# Patient Record
Sex: Female | Born: 1985 | Race: Black or African American | Hispanic: No | Marital: Single | Smoking: Never smoker
Health system: Southern US, Community
[De-identification: ages and names within clinical notes are randomized; demographics above are authoritative.]

## PROBLEM LIST (undated history)

## (undated) ENCOUNTER — Inpatient Hospital Stay (HOSPITAL_COMMUNITY): Payer: Self-pay

## (undated) DIAGNOSIS — D573 Sickle-cell trait: Secondary | ICD-10-CM

## (undated) DIAGNOSIS — R51 Headache: Secondary | ICD-10-CM

## (undated) DIAGNOSIS — D649 Anemia, unspecified: Secondary | ICD-10-CM

## (undated) DIAGNOSIS — O24419 Gestational diabetes mellitus in pregnancy, unspecified control: Secondary | ICD-10-CM

## (undated) DIAGNOSIS — D563 Thalassemia minor: Secondary | ICD-10-CM

## (undated) DIAGNOSIS — Z789 Other specified health status: Secondary | ICD-10-CM

## (undated) HISTORY — DX: Sickle-cell trait: D57.3

---

## 2011-12-13 ENCOUNTER — Encounter (HOSPITAL_COMMUNITY): Payer: Self-pay | Admitting: *Deleted

## 2011-12-13 ENCOUNTER — Inpatient Hospital Stay (HOSPITAL_COMMUNITY)
Admission: AD | Admit: 2011-12-13 | Discharge: 2011-12-13 | Disposition: A | Discharge status: Home / Self Care | Payer: Medicaid Other | Source: Ambulatory Visit | Attending: Obstetrics & Gynecology | Admitting: Obstetrics & Gynecology

## 2011-12-13 DIAGNOSIS — N760 Acute vaginitis: Secondary | ICD-10-CM | POA: Insufficient documentation

## 2011-12-13 DIAGNOSIS — A499 Bacterial infection, unspecified: Secondary | ICD-10-CM | POA: Insufficient documentation

## 2011-12-13 DIAGNOSIS — B9689 Other specified bacterial agents as the cause of diseases classified elsewhere: Secondary | ICD-10-CM | POA: Insufficient documentation

## 2011-12-13 DIAGNOSIS — O2390 Unspecified genitourinary tract infection in pregnancy, unspecified trimester: Secondary | ICD-10-CM | POA: Insufficient documentation

## 2011-12-13 DIAGNOSIS — N949 Unspecified condition associated with female genital organs and menstrual cycle: Secondary | ICD-10-CM

## 2011-12-13 DIAGNOSIS — R109 Unspecified abdominal pain: Secondary | ICD-10-CM | POA: Insufficient documentation

## 2011-12-13 DIAGNOSIS — O26899 Other specified pregnancy related conditions, unspecified trimester: Secondary | ICD-10-CM

## 2011-12-13 HISTORY — DX: Anemia, unspecified: D64.9

## 2011-12-13 LAB — URINALYSIS, ROUTINE W REFLEX MICROSCOPIC
Bilirubin Urine: NEGATIVE
Hgb urine dipstick: NEGATIVE
Protein, ur: NEGATIVE mg/dL
Urobilinogen, UA: 0.2 mg/dL (ref 0.0–1.0)

## 2011-12-13 MED ORDER — METRONIDAZOLE 500 MG PO TABS: 500.0000 mg | ORAL_TABLET | Freq: Two times a day (BID) | ORAL | Status: AC

## 2011-12-13 MED ORDER — METRONIDAZOLE 500 MG PO TABS: 500.0000 mg | ORAL_TABLET | Freq: Two times a day (BID) | ORAL | Status: DC

## 2011-12-13 MED ORDER — ACETAMINOPHEN 325 MG PO TABS
650.0000 mg | ORAL_TABLET | Freq: Once | ORAL | Status: AC
  Administered 2011-12-13: 650 mg via ORAL
  Filled 2011-12-13: qty 2

## 2011-12-13 NOTE — MAU Provider Note (Signed)
History     CSN: [id number]  Arrival date and time: 12/13/11 1648   First Provider Initiated Contact with Patient 12/13/11 1742      Chief Complaint  Patient presents with  . Abdominal Pain   HPI Pt is ?[redacted] weeks pregnant and presents with bilateral lower uterine cramping/ pain radiating down her legs.Pt is from [place]. Angela [name] and has been here for 3 months.  She denies complications with her previous pregnancies.  She has had 2 term deliveries and 3 TABs.  Pt denies vaginal discharge, UTI symptoms or constipation/diarrhea.  The pain is diffuse and is worse when she moves.  She has not taken anything for pain.    Past Medical History  Diagnosis Date  . Anemia     Past Surgical History  Procedure Date  . Cesarean section     Family History  Problem Relation Age of Onset  . Other Neg Hx     History  Substance Use Topics  . Smoking status: Never Smoker   . Smokeless tobacco: Never Used  . Alcohol Use: No    Allergies: No Known Allergies  Prescriptions prior to admission  Medication Sig Dispense Refill  . Prenatal Vit-Fe Fumarate-FA (PRENATAL MULTIVITAMIN) TABS Take 1 tablet by mouth daily.        Review of Systems  Constitutional: Negative for fever and chills.  Gastrointestinal: Positive for nausea and abdominal pain. Negative for vomiting, diarrhea and constipation.       Pain is bilateral lower abdominal-radiating down her legs  Genitourinary: Negative for dysuria, urgency, frequency, hematuria and flank pain.   Physical Exam   Blood pressure 104/63, pulse 78, temperature 98.8 F (37.1 C), temperature source Oral, resp. rate 16, height 5' 2.25" (1.581 m), weight 178 lb 9.6 oz (81.012 kg), last menstrual period 09/27/2011, SpO2 100.00%.  Physical Exam  Vitals reviewed. Constitutional: She is oriented to person, place, and time. She appears well-developed and well-nourished.  HENT:  Head: Normocephalic.  Eyes: Pupils are equal, round, and reactive to light.    Neck: Normal range of motion. Neck supple.  Cardiovascular: Normal rate.   Respiratory: Effort normal.  GI: Soft. Bowel sounds are normal. She exhibits no distension. There is tenderness. There is no rebound and no guarding.       Mild diffuse tenderness; no rebound; FHR 150bpm with doppler  Genitourinary:       Mod amount of frothy white discharge in vault; cervix closed, long, clean NT; uterus enlarged?16-18 week size; adnexal without palpable enlargement or tenderness.  Musculoskeletal: Normal range of motion.  Neurological: She is alert and oriented to person, place, and time.  Skin: Skin is warm and dry.    MAU Course  Procedures Results for orders placed during the hospital encounter of 12/13/11 (from the past 24 hour(s))  URINALYSIS, ROUTINE W REFLEX MICROSCOPIC     Status: Normal   Collection Time   12/13/11  5:15 PM      Component Value Range   Color, Urine YELLOW  YELLOW   APPearance CLEAR  CLEAR   Specific Gravity, Urine 1.010  1.005 - 1.030   pH 7.0  5.0 - 8.0   Glucose, UA NEGATIVE  NEGATIVE mg/dL   Hgb urine dipstick NEGATIVE  NEGATIVE   Bilirubin Urine NEGATIVE  NEGATIVE   Ketones, ur NEGATIVE  NEGATIVE mg/dL   Protein, ur NEGATIVE  NEGATIVE mg/dL   Urobilinogen, UA 0.2  0.0 - 1.0 mg/dL   Nitrite NEGATIVE  NEGATIVE   Leukocytes,  UA NEGATIVE  NEGATIVE  WET PREP, GENITAL     Status: Abnormal   Collection Time   12/13/11  6:10 PM      Component Value Range   Yeast Wet Prep HPF POC NONE SEEN  NONE SEEN   Trich, Wet Prep NONE SEEN  NONE SEEN   Clue Cells Wet Prep HPF POC MODERATE (*) NONE SEEN   WBC, Wet Prep HPF POC FEW (*) NONE SEEN   Tylenol given for pain Assessment and Plan  Abdominal pain in pregnancy- 2nd trimester Round ligament pain- Tylenol prn BV- prescription for Flagyl 500 mg BID for 7 days Pursue prenatal care as soon as Medicaid approved  Tyquasia Pant 12/13/2011, 5:51 PM

## 2011-12-13 NOTE — MAU Note (Signed)
Patient states she has had no prenatal care. States she had an ultrasound the first couple of weeks in June in the [place] and was told she was nine weeks. Stats she started having lower abdominal and back pain yesterday afternoon, urinary frequency, no bleeding or vaginal discharge.

## 2011-12-14 LAB — GC/CHLAMYDIA PROBE AMP, GENITAL
Chlamydia, DNA Probe: NEGATIVE
GC Probe Amp, Genital: NEGATIVE

## 2012-01-09 LAB — OB RESULTS CONSOLE HIV ANTIBODY (ROUTINE TESTING): HIV: NONREACTIVE

## 2012-01-09 LAB — OB RESULTS CONSOLE RUBELLA ANTIBODY, IGM: Rubella: IMMUNE

## 2012-01-09 LAB — OB RESULTS CONSOLE RPR: RPR: NONREACTIVE

## 2012-05-14 ENCOUNTER — Inpatient Hospital Stay (HOSPITAL_COMMUNITY)
Admission: AD | Admit: 2012-05-14 | Discharge: 2012-05-15 | Disposition: A | Discharge status: Home / Self Care | Payer: Medicaid Other | Source: Ambulatory Visit | Attending: Obstetrics | Admitting: Obstetrics

## 2012-05-14 ENCOUNTER — Encounter (HOSPITAL_COMMUNITY): Payer: Self-pay | Admitting: *Deleted

## 2012-05-14 ENCOUNTER — Inpatient Hospital Stay (HOSPITAL_COMMUNITY): Payer: Medicaid Other

## 2012-05-14 DIAGNOSIS — O368190 Decreased fetal movements, unspecified trimester, not applicable or unspecified: Secondary | ICD-10-CM | POA: Insufficient documentation

## 2012-05-14 DIAGNOSIS — O479 False labor, unspecified: Secondary | ICD-10-CM | POA: Insufficient documentation

## 2012-05-14 MED ORDER — LACTATED RINGERS IV BOLUS (SEPSIS)
1000.0000 mL | Freq: Once | INTRAVENOUS | Status: AC
  Administered 2012-05-14: 1000 mL via INTRAVENOUS

## 2012-05-14 MED ORDER — NIFEDIPINE 10 MG PO CAPS: 20.0000 mg | ORAL_CAPSULE | ORAL | Status: DC

## 2012-05-14 NOTE — MAU Provider Note (Signed)
Chief Complaint:  Labor Eval and Decreased Fetal Movement   None     HPI: Angela [name] is a 26 y.o. [id number] at [redacted]w[redacted]d who presents to maternity admissions reporting decreased fetal movement and contractions.  She has a hx of one vaginal delivery and one C/S with her last pregnancy and desires a TOLAC with this pregnancy.  She denies LOF, vaginal bleeding, vaginal itching/burning, urinary symptoms, h/a, dizziness, n/v, or fever/chills.    Pt reports good fetal movement after 1-2 hours in MAU.  Past Medical History: Past Medical History  Diagnosis Date  . Anemia      Past Surgical History: Past Surgical History  Procedure Date  . Cesarean section     Family History: Family History  Problem Relation Age of Onset  . Other Neg Hx     Social History: History  Substance Use Topics  . Smoking status: Never Smoker   . Smokeless tobacco: Never Used  . Alcohol Use: No    Allergies: No Known Allergies  Meds:  Prescriptions prior to admission  Medication Sig Dispense Refill  . Prenatal Vit-Fe Fumarate-FA (PRENATAL MULTIVITAMIN) TABS Take 1 tablet by mouth daily.        ROS: Pertinent findings in history of present illness.  Physical Exam  Blood pressure 122/72, pulse 93, temperature 98.1 F (36.7 C), temperature source Oral, resp. rate 20, height 5\' 2"  (1.575 m), weight 91.173 kg (201 lb), last menstrual period 09/27/2011, SpO2 100.00%. GENERAL: Well-developed, well-nourished female in no acute distress.  HEENT: normocephalic HEART: normal rate RESP: normal effort ABDOMEN: Soft, non-tender, gravid appropriate for gestational age EXTREMITIES: Nontender, no edema NEURO: alert and oriented  Dilation: Fingertip Effacement (%): 40 Cervical Position: Posterior Station: -3 Presentation: Vertex Exam by:: L Leftwich-Kirby CNM  FHT:  Baseline 135, moderate variability, accelerations present, no decelerations Contractions: q 4-5 mins    Imaging:  US Fetal Bpp W/o Non  Stress  05/14/2012  *RADIOLOGY REPORT*  Clinical Data: Decreased fetal movement; pelvic pain.  BIOPHYSICAL PROFILE  Number of Fetuses: 1 Heart Rate: 135 bpm Presentation: Cephalic Movement: Yes  Vertical pocket:  5.6 cm  BPP:  Movement:  2 / 2        Time:  12 minutes Breathing: 2 / 2 Tone:   2 / 2 Amniotic Fluid:  2 / 2  Total Score:  8 / 8  IMPRESSION: Single live intrauterine pregnancy noted, in cephalic presentation. Amniotic fluid levels appear grossly normal.  Biophysical profile score of 8/8, at 12 minutes.  Recommend followup with non-emergent complete OB 14+ wk US examination for fetal biometric evaluation and anatomic survey if not already performed.   Original Report Authenticated By: Tonia Ghent, M.D.     Assessment: 1. Threatened labor at term     Plan: Called Dr Clearance Coots to discuss pt hx and current assessment Percocet 5/325 x2 tabs in MAU Discharge home Labor precautions and fetal kick counts F/U with Dr Clearance Coots Return to MAU as needed    Medication List     As of 05/14/2012 11:20 PM    ASK your doctor about these medications         prenatal multivitamin Tabs   Take 1 tablet by mouth daily.        Sharen Counter Certified Nurse-Midwife 05/14/2012 11:20 PM

## 2012-05-14 NOTE — MAU Note (Signed)
Contractions, decreased fetal movement today.

## 2012-05-14 NOTE — MAU Note (Signed)
Patient is in for labor eval. And c/o no fetal movement today. She denies vaginal bleeding or lof.

## 2012-05-15 DIAGNOSIS — O479 False labor, unspecified: Secondary | ICD-10-CM

## 2012-05-15 MED ORDER — OXYCODONE-ACETAMINOPHEN 5-325 MG PO TABS
2.0000 | ORAL_TABLET | ORAL | Status: AC
  Administered 2012-05-15: 2 via ORAL
  Filled 2012-05-15: qty 2

## 2012-05-22 ENCOUNTER — Inpatient Hospital Stay (HOSPITAL_COMMUNITY)
Admission: RE | Admit: 2012-05-22 | Discharge: 2012-05-22 | Disposition: A | Discharge status: Home / Self Care | Payer: Medicaid Other | Source: Ambulatory Visit | Attending: Obstetrics | Admitting: Obstetrics

## 2012-05-22 ENCOUNTER — Encounter (HOSPITAL_COMMUNITY): Payer: Self-pay | Admitting: *Deleted

## 2012-05-22 DIAGNOSIS — O4690 Antepartum hemorrhage, unspecified, unspecified trimester: Secondary | ICD-10-CM | POA: Insufficient documentation

## 2012-05-22 DIAGNOSIS — O479 False labor, unspecified: Secondary | ICD-10-CM | POA: Insufficient documentation

## 2012-05-22 NOTE — MAU Note (Signed)
Was having bleeding like a light period early this morning. This would see some bleeding on tissue throughout the day when I wiped. Some contractions on and off all day.

## 2012-05-22 NOTE — Progress Notes (Signed)
Dr Gaynell Face notified of pt's admission and status. Aware of bleeding earlier today but none now and none on glove after sve. Aware of TOLAC desire, ctx pattern, reactive strip. Pt may go home.

## 2012-05-22 NOTE — Progress Notes (Signed)
Written and verbal d/c instructions given and understanding voiced. 

## 2012-05-22 NOTE — Discharge Instructions (Signed)
Normal Labor and Delivery  Your caregiver must first be sure you are in labor. Signs of labor include:  · You may pass what is called "the mucus plug" before labor begins. This is a small amount of blood stained mucus.  · Regular uterine contractions.  · The time between contractions get closer together.  · The discomfort and pain gradually gets more intense.  · Pains are mostly located in the back.  · Pains get worse when walking.  · The cervix (the opening of the uterus becomes thinner (begins to efface) and opens up (dilates).  Once you are in labor and admitted into the hospital or care center, your caregiver will do the following:  · A complete physical examination.  · Check your vital signs (blood pressure, pulse, temperature and the fetal heart rate).  · Do a vaginal examination (using a sterile glove and lubricant) to determine:  · The position (presentation) of the baby (head [vertex] or buttock first).  · The level (station) of the baby's head in the birth canal.  · The effacement and dilatation of the cervix.  · You may have your pubic hair shaved and be given an enema depending on your caregiver and the circumstance.  · An electronic monitor is usually placed on your abdomen. The monitor follows the length and intensity of the contractions, as well as the baby's heart rate.  · Usually, your caregiver will insert an IV in your arm with a bottle of sugar water. This is done as a precaution so that medications can be given to you quickly during labor or delivery.  NORMAL LABOR AND DELIVERY IS DIVIDED UP INTO 3 STAGES:  First Stage  This is when regular contractions begin and the cervix begins to efface and dilate. This stage can last from 3 to 15 hours. The end of the first stage is when the cervix is 100% effaced and 10 centimeters dilated. Pain medications may be given by   · Injection (morphine, demerol, etc.)  · Regional anesthesia (spinal, caudal or epidural, anesthetics given in different locations of  the spine). Paracervical pain medication may be given, which is an injection of and anesthetic on each side of the cervix.  A pregnant woman may request to have "Natural Childbirth" which is not to have any medications or anesthesia during her labor and delivery.  Second Stage  This is when the baby comes down through the birth canal (vagina) and is born. This can take 1 to 4 hours. As the baby's head comes down through the birth canal, you may feel like you are going to have a bowel movement. You will get the urge to bear down and push until the baby is delivered. As the baby's head is being delivered, the caregiver will decide if an episiotomy (a cut in the perineum and vagina area) is needed to prevent tearing of the tissue in this area. The episiotomy is sewn up after the delivery of the baby and placenta. Sometimes a mask with nitrous oxide is given for the mother to breath during the delivery of the baby to help if there is too much pain. The end of Stage 2 is when the baby is fully delivered. Then when the umbilical cord stops pulsating it is clamped and cut.  Third Stage  The third stage begins after the baby is completely delivered and ends after the placenta (afterbirth) is delivered. This usually takes 5 to 30 minutes. After the placenta is delivered, a medication   is given either by intravenous or injection to help contract the uterus and prevent bleeding. The third stage is not painful and pain medication is usually not necessary. If an episiotomy was done, it is repaired at this time.  After the delivery, the mother is watched and monitored closely for 1 to 2 hours to make sure there is no postpartum bleeding (hemorrhage). If there is a lot of bleeding, medication is given to contract the uterus and stop the bleeding.  Document Released: 02/15/2008 Document Revised: 07/31/2011 Document Reviewed: 02/15/2008  [employer]® Patient Information ©2013 [employer].

## 2012-05-25 ENCOUNTER — Encounter (HOSPITAL_COMMUNITY): Payer: Self-pay | Admitting: Obstetrics and Gynecology

## 2012-05-25 ENCOUNTER — Inpatient Hospital Stay (HOSPITAL_COMMUNITY)
Admission: AD | Admit: 2012-05-25 | Discharge: 2012-05-25 | Disposition: A | Discharge status: Home / Self Care | Payer: Medicaid Other | Source: Ambulatory Visit | Attending: Obstetrics | Admitting: Obstetrics

## 2012-05-25 DIAGNOSIS — O479 False labor, unspecified: Secondary | ICD-10-CM | POA: Insufficient documentation

## 2012-05-25 DIAGNOSIS — O34219 Maternal care for unspecified type scar from previous cesarean delivery: Secondary | ICD-10-CM | POA: Insufficient documentation

## 2012-05-25 DIAGNOSIS — O368190 Decreased fetal movements, unspecified trimester, not applicable or unspecified: Secondary | ICD-10-CM

## 2012-05-25 NOTE — Progress Notes (Signed)
Pt questioning if baby is "still head down," because of where the cardio monitor is placed for EFM.  Leopold's Maneuvers performed.  Pt informed that it is difficult to determine, if baby's head was down.  Pt reassured that position of cardio monitor doesn't always determine the position of the baby's head.

## 2012-05-25 NOTE — MAU Note (Signed)
Pt reprots not feeling baby move since last night. Pt c/o contractions and back pain Denies bleeding or leaking at this time

## 2012-05-25 NOTE — MAU Note (Signed)
"  Around 0600 I noticed there was decreased FM and I haven't felt the baby move since then.  I am past my due date, I am in pain and I just want him out.  Can we just call the doctor and tell him that I want him to preform a c-section.  I had a c-section with my daughter and I just can't take it anymore.  No VB or leaking of fluid."

## 2012-05-25 NOTE — MAU Provider Note (Signed)
History     CSN: [id number]  Arrival date and time: 05/25/12 1409   None     Chief Complaint  Patient presents with  . Decreased Fetal Movement   HPI This is a 27 y.o. female at [redacted]w[redacted]d who presents with report of decreased fetal movement for two days. Also reports contractions for a week. Wants to have a C/S today "since I am overdue and  Not dilating".  Spoke to Dr Clearance Coots about this and he said for her to wait it out some more.   RN Note: Pt reprots not feeling baby move since last night. Pt c/o contractions and back pain Denies bleeding or leaking at this time      OB History    Grav Para Term Preterm Abortions TAB SAB Ect Mult Living   6 2 2  3 3    2       Past Medical History  Diagnosis Date  . Anemia     Past Surgical History  Procedure Date  . Cesarean section     Family History  Problem Relation Age of Onset  . Other Neg Hx   . Cancer Father     History  Substance Use Topics  . Smoking status: Never Smoker   . Smokeless tobacco: Never Used  . Alcohol Use: No    Allergies: No Known Allergies  Prescriptions prior to admission  Medication Sig Dispense Refill  . Prenatal Vit-Fe Fumarate-FA (PRENATAL MULTIVITAMIN) TABS Take 1 tablet by mouth daily.        Review of Systems  Constitutional: Negative for fever and chills.  Respiratory: Negative for cough and wheezing.   Gastrointestinal: Positive for abdominal pain (with contractions). Negative for nausea and vomiting.       Denies leaking or bleeding Decreased FM  Genitourinary: Negative for dysuria.  Neurological: Negative for dizziness and headaches.  Psychiatric/Behavioral: Negative for depression. The patient is not nervous/anxious.    Physical Exam   Blood pressure 112/62, pulse 87, resp. rate 18, height 5\' 2"  (1.575 m), weight 202 lb 6.4 oz (91.808 kg), last menstrual period 09/27/2011.  Physical Exam  Constitutional: She is oriented to person, place, and time. She appears well-developed  and well-nourished. No distress (does not have to breathe through contractions).  Cardiovascular: Normal rate.   Respiratory: Effort normal.  GI: Soft.  Genitourinary: Vagina normal and uterus normal. No vaginal discharge found.       Cervix 1cm ext os, FT internal os, long, very high, soft, cannot feel presenting part   Musculoskeletal: Normal range of motion.  Neurological: She is alert and oriented to person, place, and time.  Skin: Skin is warm and dry.  Psychiatric: She has a normal mood and affect.   FHR Reactive Contractions every 4-6 minutes  >> later UCs every 6-8 minutes ... Patient does not appear uncomfortable with contractions. She talks through them.  Husband states "she is in terrible pain" but I am unable to tell by her demeanor that she is having one.    MAU Course  Procedures  MDM Discussed with Dr Gaynell Face who will call the patient and reiterate that we cannot do a C/S today with non-emergent situation.  Advised to call Dr Clearance Coots in the morning to discuss.  After speaking with Dr Gaynell Face, the husband continues to demand a C/S now. I advised him that I cannot make that decision.  I reassured them the baby is looking well and that it is up to Dr Gaynell Face to decide  treatment. They asked again to speak with him. Dr Gaynell Face called them again and told them he would not do a C/S today unless there was a medical indication to do so.  FOB then stated they want an [place] to determine if fluid is leaking. I explained we determine that with speculum exam and prepared to do that. The patient refused speculum exam. I offered an Ultrasound for reassurance and they both refused that, stating they want a C/Section.   Later, Dr Gaynell Face called back and said he will schedule a C/S for Monday around Noon. He will call the patient tomorrow with details.  Informed patient and FOB of this and they continue to state they don't understand why we can't do the C/S today. I again informed them it is up  to the doctor on call.   FHR continued to be reactive with no decelerations.   I advised them that she needs to be NPO past midnight on Sunday night if surgery is scheduled Monday. Labor precautions reviewed. Kick count sheet given.     Assessment and Plan  A:  SIUP at [redacted]w[redacted]d       Decreased fetal movement with reassuring FHR tracing     Previous C/Section, now desires repeat C/S  P:  Discharge home       Kick counts      Labor precautions      Surgery to be scheduled Monday [facility] 05/25/2012, 2:37 PM

## 2012-05-27 ENCOUNTER — Encounter (HOSPITAL_COMMUNITY)
Admission: RE | Disposition: A | Discharge status: Home / Self Care | Payer: Self-pay | Source: Ambulatory Visit | Attending: Obstetrics

## 2012-05-27 ENCOUNTER — Encounter (HOSPITAL_COMMUNITY): Payer: Self-pay | Admitting: Anesthesiology

## 2012-05-27 ENCOUNTER — Inpatient Hospital Stay (HOSPITAL_COMMUNITY): Payer: Medicaid Other | Admitting: Anesthesiology

## 2012-05-27 ENCOUNTER — Encounter (HOSPITAL_COMMUNITY): Payer: Self-pay

## 2012-05-27 ENCOUNTER — Inpatient Hospital Stay (HOSPITAL_COMMUNITY)
Admission: RE | Admit: 2012-05-27 | Discharge: 2012-05-29 | DRG: 766 | Disposition: A | Discharge status: Home / Self Care | Payer: Medicaid Other | Source: Ambulatory Visit | Attending: Obstetrics | Admitting: Obstetrics

## 2012-05-27 ENCOUNTER — Encounter (HOSPITAL_COMMUNITY): Payer: Self-pay | Admitting: *Deleted

## 2012-05-27 DIAGNOSIS — O34219 Maternal care for unspecified type scar from previous cesarean delivery: Principal | ICD-10-CM | POA: Diagnosis present

## 2012-05-27 DIAGNOSIS — Z98891 History of uterine scar from previous surgery: Secondary | ICD-10-CM

## 2012-05-27 HISTORY — DX: Headache: R51

## 2012-05-27 HISTORY — DX: Other specified health status: Z78.9

## 2012-05-27 LAB — PREPARE RBC (CROSSMATCH)

## 2012-05-27 LAB — CBC
Hemoglobin: 12.4 g/dL (ref 12.0–15.0)
RBC: 4.47 MIL/uL (ref 3.87–5.11)
WBC: 6.9 10*3/uL (ref 4.0–10.5)

## 2012-05-27 LAB — RPR: RPR Ser Ql: NONREACTIVE

## 2012-05-27 LAB — ABO/RH: ABO/RH(D): A POS

## 2012-05-27 LAB — SURGICAL PCR SCREEN: Staphylococcus aureus: NEGATIVE

## 2012-05-27 SURGERY — Surgical Case
Anesthesia: Spinal | Site: Abdomen | Wound class: Clean Contaminated

## 2012-05-27 MED ORDER — PHENYLEPHRINE 40 MCG/ML (10ML) SYRINGE FOR IV PUSH (FOR BLOOD PRESSURE SUPPORT)
PREFILLED_SYRINGE | INTRAVENOUS | Status: AC
  Filled 2012-05-27: qty 15

## 2012-05-27 MED ORDER — SODIUM CHLORIDE 0.9 % IR SOLN
Status: DC | PRN
  Administered 2012-05-27: 1000 mL

## 2012-05-27 MED ORDER — DEXTROSE 5 % IV SOLN
1.0000 ug/kg/h | INTRAVENOUS | Status: DC | PRN
  Filled 2012-05-27: qty 2

## 2012-05-27 MED ORDER — LACTATED RINGERS IV SOLN: INTRAVENOUS | Status: DC

## 2012-05-27 MED ORDER — TETANUS-DIPHTH-ACELL PERTUSSIS 5-2.5-18.5 LF-MCG/0.5 IM SUSP: 0.5000 mL | Freq: Once | INTRAMUSCULAR | Status: DC

## 2012-05-27 MED ORDER — FENTANYL CITRATE 0.05 MG/ML IJ SOLN
INTRAMUSCULAR | Status: AC
  Filled 2012-05-27: qty 2

## 2012-05-27 MED ORDER — ONDANSETRON HCL 4 MG/2ML IJ SOLN: 4.0000 mg | Freq: Three times a day (TID) | INTRAMUSCULAR | Status: DC | PRN

## 2012-05-27 MED ORDER — IBUPROFEN 600 MG PO TABS: 600.0000 mg | ORAL_TABLET | Freq: Four times a day (QID) | ORAL | Status: DC | PRN

## 2012-05-27 MED ORDER — DIPHENHYDRAMINE HCL 25 MG PO CAPS: 25.0000 mg | ORAL_CAPSULE | Freq: Four times a day (QID) | ORAL | Status: DC | PRN

## 2012-05-27 MED ORDER — OXYTOCIN 10 UNIT/ML IJ SOLN
40.0000 [IU] | INTRAVENOUS | Status: DC | PRN
  Administered 2012-05-27: 40 [IU] via INTRAVENOUS

## 2012-05-27 MED ORDER — FENTANYL CITRATE 0.05 MG/ML IJ SOLN
INTRAMUSCULAR | Status: AC
  Administered 2012-05-27: 50 ug
  Filled 2012-05-27: qty 2

## 2012-05-27 MED ORDER — LACTATED RINGERS IV SOLN
Freq: Once | INTRAVENOUS | Status: AC
  Administered 2012-05-27: 125 mL/h via INTRAVENOUS
  Administered 2012-05-27 (×3): via INTRAVENOUS

## 2012-05-27 MED ORDER — METOCLOPRAMIDE HCL 5 MG/ML IJ SOLN: 10.0000 mg | Freq: Three times a day (TID) | INTRAMUSCULAR | Status: DC | PRN

## 2012-05-27 MED ORDER — SODIUM CHLORIDE 0.9 % IJ SOLN: 3.0000 mL | INTRAMUSCULAR | Status: DC | PRN

## 2012-05-27 MED ORDER — MORPHINE SULFATE 0.5 MG/ML IJ SOLN
INTRAMUSCULAR | Status: AC
  Filled 2012-05-27: qty 10

## 2012-05-27 MED ORDER — BUPIVACAINE IN DEXTROSE 0.75-8.25 % IT SOLN
INTRATHECAL | Status: DC | PRN
  Administered 2012-05-27: 1.4 mL via INTRATHECAL

## 2012-05-27 MED ORDER — FENTANYL CITRATE 0.05 MG/ML IJ SOLN
INTRAMUSCULAR | Status: DC | PRN
  Administered 2012-05-27: 25 ug via INTRATHECAL

## 2012-05-27 MED ORDER — DIPHENHYDRAMINE HCL 50 MG/ML IJ SOLN
12.5000 mg | INTRAMUSCULAR | Status: DC | PRN
  Administered 2012-05-27: 12.5 mg via INTRAVENOUS

## 2012-05-27 MED ORDER — ONDANSETRON HCL 4 MG/2ML IJ SOLN
INTRAMUSCULAR | Status: DC | PRN
  Administered 2012-05-27: 4 mg via INTRAVENOUS

## 2012-05-27 MED ORDER — SENNOSIDES-DOCUSATE SODIUM 8.6-50 MG PO TABS
2.0000 | ORAL_TABLET | Freq: Every day | ORAL | Status: DC
  Administered 2012-05-28: 2 via ORAL

## 2012-05-27 MED ORDER — MEPERIDINE HCL 25 MG/ML IJ SOLN
INTRAMUSCULAR | Status: AC
  Administered 2012-05-27: 6.25 mg
  Filled 2012-05-27: qty 1

## 2012-05-27 MED ORDER — MORPHINE SULFATE (PF) 0.5 MG/ML IJ SOLN
INTRAMUSCULAR | Status: DC | PRN
  Administered 2012-05-27: .15 mg via INTRATHECAL

## 2012-05-27 MED ORDER — CEFAZOLIN SODIUM-DEXTROSE 2-3 GM-% IV SOLR
INTRAVENOUS | Status: AC
  Filled 2012-05-27: qty 50

## 2012-05-27 MED ORDER — KETOROLAC TROMETHAMINE 30 MG/ML IJ SOLN: 30.0000 mg | Freq: Four times a day (QID) | INTRAMUSCULAR | Status: AC | PRN

## 2012-05-27 MED ORDER — OXYTOCIN 40 UNITS IN LACTATED RINGERS INFUSION - SIMPLE MED: 62.5000 mL/h | INTRAVENOUS | Status: AC

## 2012-05-27 MED ORDER — ONDANSETRON HCL 4 MG/2ML IJ SOLN: 4.0000 mg | INTRAMUSCULAR | Status: DC | PRN

## 2012-05-27 MED ORDER — DIPHENHYDRAMINE HCL 50 MG/ML IJ SOLN: 25.0000 mg | INTRAMUSCULAR | Status: DC | PRN

## 2012-05-27 MED ORDER — KETOROLAC TROMETHAMINE 30 MG/ML IJ SOLN
INTRAMUSCULAR | Status: AC
  Filled 2012-05-27: qty 1

## 2012-05-27 MED ORDER — SCOPOLAMINE 1 MG/3DAYS TD PT72
MEDICATED_PATCH | TRANSDERMAL | Status: AC
  Administered 2012-05-27: 1.5 mg via TRANSDERMAL
  Filled 2012-05-27: qty 1

## 2012-05-27 MED ORDER — ONDANSETRON HCL 4 MG/2ML IJ SOLN
INTRAMUSCULAR | Status: AC
  Filled 2012-05-27: qty 2

## 2012-05-27 MED ORDER — DIPHENHYDRAMINE HCL 50 MG/ML IJ SOLN
INTRAMUSCULAR | Status: AC
  Filled 2012-05-27: qty 1

## 2012-05-27 MED ORDER — MEPERIDINE HCL 25 MG/ML IJ SOLN: 6.2500 mg | INTRAMUSCULAR | Status: DC | PRN

## 2012-05-27 MED ORDER — OXYTOCIN 10 UNIT/ML IJ SOLN
INTRAMUSCULAR | Status: AC
  Filled 2012-05-27: qty 4

## 2012-05-27 MED ORDER — SCOPOLAMINE 1 MG/3DAYS TD PT72
1.0000 | MEDICATED_PATCH | Freq: Once | TRANSDERMAL | Status: DC
  Administered 2012-05-27: 1.5 mg via TRANSDERMAL

## 2012-05-27 MED ORDER — OXYCODONE-ACETAMINOPHEN 5-325 MG PO TABS
1.0000 | ORAL_TABLET | ORAL | Status: DC | PRN
  Administered 2012-05-28 (×2): 1 via ORAL
  Administered 2012-05-29: 2 via ORAL
  Administered 2012-05-29: 1 via ORAL
  Filled 2012-05-27: qty 2
  Filled 2012-05-27 (×3): qty 1

## 2012-05-27 MED ORDER — EPHEDRINE 5 MG/ML INJ
INTRAVENOUS | Status: AC
  Filled 2012-05-27: qty 10

## 2012-05-27 MED ORDER — SIMETHICONE 80 MG PO CHEW: 80.0000 mg | CHEWABLE_TABLET | ORAL | Status: DC | PRN

## 2012-05-27 MED ORDER — ZOLPIDEM TARTRATE 5 MG PO TABS: 5.0000 mg | ORAL_TABLET | Freq: Every evening | ORAL | Status: DC | PRN

## 2012-05-27 MED ORDER — EPHEDRINE SULFATE 50 MG/ML IJ SOLN
INTRAMUSCULAR | Status: DC | PRN
  Administered 2012-05-27 (×2): 10 mg via INTRAVENOUS
  Administered 2012-05-27: 5 mg via INTRAVENOUS

## 2012-05-27 MED ORDER — SIMETHICONE 80 MG PO CHEW
80.0000 mg | CHEWABLE_TABLET | Freq: Three times a day (TID) | ORAL | Status: DC
  Administered 2012-05-28 – 2012-05-29 (×5): 80 mg via ORAL

## 2012-05-27 MED ORDER — PHENYLEPHRINE 40 MCG/ML (10ML) SYRINGE FOR IV PUSH (FOR BLOOD PRESSURE SUPPORT)
PREFILLED_SYRINGE | INTRAVENOUS | Status: AC
  Filled 2012-05-27: qty 5

## 2012-05-27 MED ORDER — DIPHENHYDRAMINE HCL 25 MG PO CAPS
25.0000 mg | ORAL_CAPSULE | ORAL | Status: DC | PRN
  Filled 2012-05-27: qty 1

## 2012-05-27 MED ORDER — LANOLIN HYDROUS EX OINT: 1.0000 "application " | TOPICAL_OINTMENT | CUTANEOUS | Status: DC | PRN

## 2012-05-27 MED ORDER — NALOXONE HCL 0.4 MG/ML IJ SOLN: 0.4000 mg | INTRAMUSCULAR | Status: DC | PRN

## 2012-05-27 MED ORDER — ONDANSETRON HCL 4 MG PO TABS: 4.0000 mg | ORAL_TABLET | ORAL | Status: DC | PRN

## 2012-05-27 MED ORDER — MENTHOL 3 MG MT LOZG: 1.0000 | LOZENGE | OROMUCOSAL | Status: DC | PRN

## 2012-05-27 MED ORDER — NALBUPHINE SYRINGE 5 MG/0.5 ML
5.0000 mg | INJECTION | INTRAMUSCULAR | Status: DC | PRN
  Filled 2012-05-27: qty 1

## 2012-05-27 MED ORDER — FENTANYL CITRATE 0.05 MG/ML IJ SOLN: 25.0000 ug | INTRAMUSCULAR | Status: DC | PRN

## 2012-05-27 MED ORDER — WITCH HAZEL-GLYCERIN EX PADS: 1.0000 "application " | MEDICATED_PAD | CUTANEOUS | Status: DC | PRN

## 2012-05-27 MED ORDER — PHENYLEPHRINE HCL 10 MG/ML IJ SOLN
INTRAMUSCULAR | Status: DC | PRN
  Administered 2012-05-27: 80 ug via INTRAVENOUS
  Administered 2012-05-27: 120 ug via INTRAVENOUS
  Administered 2012-05-27 (×3): 80 ug via INTRAVENOUS
  Administered 2012-05-27: 120 ug via INTRAVENOUS
  Administered 2012-05-27: 40 ug via INTRAVENOUS
  Administered 2012-05-27: 80 ug via INTRAVENOUS
  Administered 2012-05-27: 120 ug via INTRAVENOUS

## 2012-05-27 MED ORDER — IBUPROFEN 600 MG PO TABS
600.0000 mg | ORAL_TABLET | Freq: Four times a day (QID) | ORAL | Status: DC
  Administered 2012-05-28 – 2012-05-29 (×6): 600 mg via ORAL
  Filled 2012-05-27 (×6): qty 1

## 2012-05-27 MED ORDER — DIBUCAINE 1 % RE OINT: 1.0000 "application " | TOPICAL_OINTMENT | RECTAL | Status: DC | PRN

## 2012-05-27 MED ORDER — KETOROLAC TROMETHAMINE 30 MG/ML IJ SOLN
30.0000 mg | Freq: Four times a day (QID) | INTRAMUSCULAR | Status: AC | PRN
  Administered 2012-05-27: 30 mg via INTRAVENOUS

## 2012-05-27 MED ORDER — CEFAZOLIN SODIUM-DEXTROSE 2-3 GM-% IV SOLR
2.0000 g | INTRAVENOUS | Status: AC
  Administered 2012-05-27: 2 g via INTRAVENOUS

## 2012-05-27 MED ORDER — MEDROXYPROGESTERONE ACETATE 150 MG/ML IM SUSP: 150.0000 mg | INTRAMUSCULAR | Status: DC | PRN

## 2012-05-27 MED ORDER — MEPERIDINE HCL 25 MG/ML IJ SOLN
INTRAMUSCULAR | Status: AC
  Filled 2012-05-27: qty 1

## 2012-05-27 MED ORDER — PRENATAL MULTIVITAMIN CH
1.0000 | ORAL_TABLET | Freq: Every day | ORAL | Status: DC
  Administered 2012-05-28 – 2012-05-29 (×2): 1 via ORAL
  Filled 2012-05-27 (×2): qty 1

## 2012-05-27 SURGICAL SUPPLY — 42 items
BENZOIN TINCTURE PRP APPL 2/3 (GAUZE/BANDAGES/DRESSINGS) ×2 IMPLANT
CANISTER WOUND CARE 500ML ATS (WOUND CARE) IMPLANT
CLOTH BEACON ORANGE TIMEOUT ST (SAFETY) ×2 IMPLANT
CONTAINER PREFILL 10% NBF 15ML (MISCELLANEOUS) IMPLANT
DRAPE LG THREE QUARTER DISP (DRAPES) ×2 IMPLANT
DRESSING TELFA 8X3 (GAUZE/BANDAGES/DRESSINGS) IMPLANT
DRSG OPSITE POSTOP 4X10 (GAUZE/BANDAGES/DRESSINGS) ×2 IMPLANT
DRSG VAC ATS LRG SENSATRAC (GAUZE/BANDAGES/DRESSINGS) IMPLANT
DRSG VAC ATS MED SENSATRAC (GAUZE/BANDAGES/DRESSINGS) IMPLANT
DRSG VAC ATS SM SENSATRAC (GAUZE/BANDAGES/DRESSINGS) IMPLANT
DURAPREP 26ML APPLICATOR (WOUND CARE) ×2 IMPLANT
ELECT REM PT RETURN 9FT ADLT (ELECTROSURGICAL) ×2
ELECTRODE REM PT RTRN 9FT ADLT (ELECTROSURGICAL) ×1 IMPLANT
EXTRACTOR VACUUM M CUP 4 TUBE (SUCTIONS) IMPLANT
GAUZE SPONGE 4X4 12PLY STRL LF (GAUZE/BANDAGES/DRESSINGS) ×2 IMPLANT
GLOVE BIO SURGEON STRL SZ 6.5 (GLOVE) ×4 IMPLANT
GOWN PREVENTION PLUS LG XLONG (DISPOSABLE) ×6 IMPLANT
KIT ABG SYR 3ML LUER SLIP (SYRINGE) IMPLANT
NEEDLE HYPO 25X5/8 SAFETYGLIDE (NEEDLE) IMPLANT
NS IRRIG 1000ML POUR BTL (IV SOLUTION) ×2 IMPLANT
PACK C SECTION WH (CUSTOM PROCEDURE TRAY) ×2 IMPLANT
PAD ABD 7.5X8 STRL (GAUZE/BANDAGES/DRESSINGS) IMPLANT
PAD OB MATERNITY 4.3X12.25 (PERSONAL CARE ITEMS) IMPLANT
RTRCTR C-SECT PINK 25CM LRG (MISCELLANEOUS) IMPLANT
SLEEVE SCD COMPRESS KNEE MED (MISCELLANEOUS) IMPLANT
STAPLER VISISTAT 35W (STAPLE) IMPLANT
STRIP CLOSURE SKIN 1/2X4 (GAUZE/BANDAGES/DRESSINGS) ×2 IMPLANT
SUT MNCRL 0 VIOLET CTX 36 (SUTURE) ×3 IMPLANT
SUT MNCRL AB 3-0 PS2 27 (SUTURE) ×2 IMPLANT
SUT MONOCRYL 0 CTX 36 (SUTURE) ×3
SUT PDS AB 0 CTX 36 PDP370T (SUTURE) ×4 IMPLANT
SUT PLAIN 0 NONE (SUTURE) IMPLANT
SUT VIC AB 0 CTXB 36 (SUTURE) IMPLANT
SUT VIC AB 2-0 CT1 (SUTURE) ×2 IMPLANT
SUT VIC AB 2-0 CT1 27 (SUTURE) ×1
SUT VIC AB 2-0 CT1 TAPERPNT 27 (SUTURE) ×1 IMPLANT
SUT VIC AB 2-0 SH 27 (SUTURE)
SUT VIC AB 2-0 SH 27XBRD (SUTURE) IMPLANT
TAPE CLOTH SURG 4X10 WHT LF (GAUZE/BANDAGES/DRESSINGS) ×2 IMPLANT
TOWEL OR 17X24 6PK STRL BLUE (TOWEL DISPOSABLE) ×6 IMPLANT
TRAY FOLEY CATH 14FR (SET/KITS/TRAYS/PACK) ×2 IMPLANT
WATER STERILE IRR 1000ML POUR (IV SOLUTION) ×2 IMPLANT

## 2012-05-27 NOTE — Anesthesia Procedure Notes (Signed)
Spinal  Patient location during procedure: OR Start time: 05/27/2012 6:04 PM Staffing Anesthesiologist: Homar Weinkauf A. Performed by: anesthesiologist  Preanesthetic Checklist Completed: patient identified, site marked, surgical consent, pre-op evaluation, timeout performed, IV checked, risks and benefits discussed and monitors and equipment checked Spinal Block Patient position: sitting Prep: site prepped and draped and DuraPrep Patient monitoring: heart rate, cardiac monitor, continuous pulse ox and blood pressure Approach: midline Location: L3-4 Injection technique: single-shot Needle Needle type: Sprotte  Needle gauge: 24 G Needle length: 9 cm Needle insertion depth: 7 cm Assessment Sensory level: T4 Additional Notes Patient tolerated procedure well. Adequate sensory level.

## 2012-05-27 NOTE — Preoperative (Signed)
Beta Blockers   Reason not to administer Beta Blockers:Not Applicable 

## 2012-05-27 NOTE — Transfer of Care (Signed)
Immediate Anesthesia Transfer of Care Note  Patient: Angela [name]  Procedure(s) Performed: Procedure(s) (LRB) with comments: CESAREAN SECTION (N/A) - Repeat cesarean section with delivery of baby boy at 74.   Patient Location: PACU  Anesthesia Type:Spinal  Level of Consciousness: awake  Airway & Oxygen Therapy: Patient Spontanous Breathing  Post-op Assessment: Report given to PACU RN  Post vital signs: Reviewed and stable  Complications: No apparent anesthesia complications

## 2012-05-27 NOTE — Anesthesia Postprocedure Evaluation (Signed)
Anesthesia Post Note  Patient: Angela [name]  Procedure(s) Performed: Procedure(s) (LRB): CESAREAN SECTION (N/A)  Anesthesia type: Spinal  Patient location: PACU  Post pain: Pain level controlled  Post assessment: Post-op Vital signs reviewed  Last Vitals:  Filed Vitals:   05/27/12 2000  BP:   Pulse: 88  Temp:   Resp: 24    Post vital signs: Reviewed  Level of consciousness: awake  Complications: No apparent anesthesia complications

## 2012-05-27 NOTE — Anesthesia Preprocedure Evaluation (Signed)
Anesthesia Evaluation  Patient identified by MRN, date of birth, ID band Patient awake    Reviewed: Allergy & Precautions, H&P , NPO status , Patient's Chart, lab work & pertinent test results  Airway Mallampati: III TM Distance: >3 FB Neck ROM: Full    Dental No notable dental hx. (+) Teeth Intact   Pulmonary neg pulmonary ROS,  breath sounds clear to auscultation  Pulmonary exam normal       Cardiovascular negative cardio ROS  Rhythm:Regular Rate:Normal     Neuro/Psych negative neurological ROS  negative psych ROS   GI/Hepatic negative GI ROS, Neg liver ROS,   Endo/Other  Obesity  Renal/GU negative Renal ROS  negative genitourinary   Musculoskeletal negative musculoskeletal ROS (+)   Abdominal (+) + obese,   Peds  Hematology negative hematology ROS (+)   Anesthesia Other Findings   Reproductive/Obstetrics (+) Pregnancy Previous C/Section                           Anesthesia Physical Anesthesia Plan  ASA: II  Anesthesia Plan: Spinal   Post-op Pain Management:    Induction:   Airway Management Planned: Natural Airway  Additional Equipment:   Intra-op Plan:   Post-operative Plan:   Informed Consent: I have reviewed the patients History and Physical, chart, labs and discussed the procedure including the risks, benefits and alternatives for the proposed anesthesia with the patient or authorized representative who has indicated his/her understanding and acceptance.   Dental advisory given  Plan Discussed with: Anesthesiologist, CRNA and Surgeon  Anesthesia Plan Comments:         Anesthesia Quick Evaluation

## 2012-05-27 NOTE — OR Nursing (Addendum)
Uterus massaged by S. Raffaele Derise RN.  Two tubes of cord blood sent to lab.  10cc of blood evacuated from uterus during uterine massage. 

## 2012-05-27 NOTE — H&P (Signed)
Angela [name] is a 27 y.o. female presenting for a scheduled C/D. Maternal Medical History:  Reason for admission: The pt has a h/o a previous C/D and a vaginal delivery.  She elects to proceed with  an elective repeat C/D.  Fetal activity: Perceived fetal activity is normal.    Prenatal complications: no prenatal complications   OB History    Grav Para Term Preterm Abortions TAB SAB Ect Mult Living   6 2 2  3 3    2      Past Medical History  Diagnosis Date  . Anemia   . No pertinent past medical history    Past Surgical History  Procedure Date  . Cesarean section    Family History: family history includes Cancer in her father.  There is no history of Other. Social History:  reports that she has never smoked. She has never used smokeless tobacco. She reports that she does not drink alcohol or use illicit drugs.   Prenatal Transfer Tool  Maternal Diabetes: No  Maternal Ultrasounds/Referrals: Normal Fetal Ultrasounds or other Referrals:  None Maternal Substance Abuse:  No Significant Maternal Medications:  None Significant Maternal Lab Results:  None Other Comments:  None  Review of Systems  Constitutional: Negative for fever.  Eyes: Negative for blurred vision.  Respiratory: Negative for shortness of breath.   Gastrointestinal: Negative for vomiting.  Skin: Negative for rash.  Neurological: Negative for headaches.      Last menstrual period 08/17/2011. Maternal Exam:  Abdomen: not evaluated.  Introitus: not evaluated.   Cervix: not evaluated.   Fetal Exam Fetal Monitor Review: Mode: hand-held doppler probe.       Physical Exam  Constitutional: She appears well-developed.  HENT:  Head: Normocephalic.  Neck: Neck supple. No thyromegaly present.  Cardiovascular: Normal rate and regular rhythm.   Respiratory: Breath sounds normal.  GI: Soft. Bowel sounds are normal.  Skin: No rash noted.    Prenatal labs: ABO, Rh: A/Positive/-- (08/20  0000) Antibody: Negative (08/20 0000) Rubella: Immune (08/20 0000) RPR: Nonreactive (08/20 0000)  HBsAg: Negative (08/20 0000)  HIV: Non-reactive (08/20 0000)  GBS:     Assessment/Plan: Multipara with an IUP @ [redacted]w[redacted]d.  H/O a previous C/D.  Declines TOLAC.  Admit Repeat C/D   JACKSON-MOORE,Violetta Lavalle A 05/27/2012, 1:24 PM

## 2012-05-27 NOTE — Op Note (Signed)
Cesarean Section Procedure Note   [name]   05/27/2012  Indications: Scheduled Proceedure/Maternal Request   Pre-operative Diagnosis: 40week intrauterine pregnancy/previous cesarean section desires repeat.   Post-operative Diagnosis: Same   Surgeon: Antionette Char A  Assistants: Coral Ceo A  Anesthesia: spinal  Procedure Details:  The patient was seen in the Holding Room. The risks, benefits, complications, treatment options, and expected outcomes were discussed with the patient. The patient concurred with the proposed plan, giving informed consent. The patient was identified as Angela [name] and the procedure verified as C-Section Delivery. A Time Out was held and the above information confirmed.  After induction of anesthesia, the patient was draped and prepped in the usual sterile manner. A transverse incision was made and carried down through the subcutaneous tissue to the fascia. The fascial incision was made and extended transversely. The fascia was separated from the underlying rectus tissue superiorly and inferiorly. The peritoneum was identified and entered. The peritoneal incision was extended longitudinally. The  transversely and the bladder flap was bluntly . A low transverse uterine incision was made. Delivered from cephalic presentation was a  living newborn female infant.     A cord ph was not sent. The umbilical cord was clamped and cut cord. A sample was obtained for evaluation. The placenta was removed Intact and appeared normal.  The uterine incision was closed with running locked sutures of 1-0 Monocryl. A second imbricating layer of the same suture was placed.  Hemostasis was observed. The paracolic gutters were irrigated. The parieto peritoneum was closed in a running fashion with 2-0 Vicryl.  The fascia was then reapproximated with running sutures of 0 PDS.  The skin was closed with suture.  Instrument, sponge, and needle counts were correct prior the  abdominal closure and were correct at the conclusion of the case.    Findings:  See above   Estimated Blood Loss: 800 ml  Total IV Fluids: per Anesthesiology  Urine Output: per Anesthesiology  Specimens: none  Complications: no complications  Disposition: PACU - hemodynamically stable.  Maternal Condition: stable   Baby condition / location:  nursery-stable    Signed: Surgeon(s): [name], MD Antionette Char, MD

## 2012-05-28 ENCOUNTER — Encounter (HOSPITAL_COMMUNITY): Payer: Self-pay | Admitting: Obstetrics & Gynecology

## 2012-05-28 LAB — CBC
Platelets: 196 10*3/uL (ref 150–400)
RBC: 3.85 MIL/uL — ABNORMAL LOW (ref 3.87–5.11)
WBC: 8.2 10*3/uL (ref 4.0–10.5)

## 2012-05-28 NOTE — Progress Notes (Signed)
UR chart review completed.  

## 2012-05-28 NOTE — Anesthesia Postprocedure Evaluation (Signed)
  Anesthesia Post-op Note  Patient: Angela [name]  Procedure(s) Performed: Procedure(s) (LRB) with comments: CESAREAN SECTION (N/A) - Repeat cesarean section with delivery of baby boy at 6.   Patient Location: Mother/Baby  Anesthesia Type:Spinal  Level of Consciousness: awake and oriented  Airway and Oxygen Therapy: Patient Spontanous Breathing  Post-op Pain: none  Post-op Assessment: Patient's Cardiovascular Status Stable, Respiratory Function Stable, Patent Airway, No signs of Nausea or vomiting, Adequate PO intake, Pain level controlled, No headache, No backache, No residual numbness and No residual motor weakness  Post-op Vital Signs: Reviewed and stable  Complications: No apparent anesthesia complications

## 2012-05-28 NOTE — Progress Notes (Signed)
Subjective: Postpartum Day 1: Cesarean Delivery Patient reports no complaints.  Objective: Vital signs in last 24 hours: Temp:  [97.4 F (36.3 C)-98.8 F (37.1 C)] 98.5 F (36.9 C) (01/07 1040) Pulse Rate:  [69-109] 75  (01/07 1040) Resp:  [13-24] 18  (01/07 1040) BP: (91-124)/(47-76) 96/65 mmHg (01/07 1040) SpO2:  [97 %-100 %] 98 % (01/07 1040) Weight:  [202 lb (91.627 kg)] 202 lb (91.627 kg) (01/07 0251)  Physical Exam:  General: alert and no distress Lochia: appropriate Uterine Fundus: firm Incision: healing well DVT Evaluation: No evidence of DVT seen on physical exam.   Basename 05/28/12 0545 05/27/12 1525  HGB 10.5* 12.4  HCT 31.4* 36.8    Assessment/Plan: Status post Cesarean section. Doing well postoperatively.  Continue current care.  HARPER,CHARLES A 05/28/2012, 1:29 PM

## 2012-05-28 NOTE — Addendum Note (Signed)
Addendum  created 05/28/12 1344 by Suella Grove, CRNA   Modules edited:Notes Section

## 2012-05-29 LAB — TYPE AND SCREEN
ABO/RH(D): A POS
Unit division: 0

## 2012-05-29 LAB — BIRTH TISSUE RECOVERY COLLECTION (PLACENTA DONATION)

## 2012-05-29 MED ORDER — IBUPROFEN 600 MG PO TABS: 600.0000 mg | ORAL_TABLET | Freq: Four times a day (QID) | ORAL | Status: DC

## 2012-05-29 MED ORDER — OXYCODONE-ACETAMINOPHEN 5-325 MG PO TABS: 1.0000 | ORAL_TABLET | ORAL | Status: DC | PRN

## 2012-05-29 NOTE — Progress Notes (Signed)
Subjective: Postpartum Day 2: Cesarean Delivery Patient reports no complaints.  Objective: Vital signs in last 24 hours: Temp:  [97.7 F (36.5 C)-98.5 F (36.9 C)] 97.9 F (36.6 C) (01/08 0458) Pulse Rate:  [63-75] 65  (01/08 0458) Resp:  [18-20] 18  (01/08 0458) BP: (90-98)/(60-65) 95/64 mmHg (01/08 0458) SpO2:  [98 %-100 %] 98 % (01/07 1820)  Physical Exam:  General: alert and no distress Lochia: appropriate Uterine Fundus: firm Incision: healing well DVT Evaluation: No evidence of DVT seen on physical exam.   Basename 05/28/12 0545 05/27/12 1525  HGB 10.5* 12.4  HCT 31.4* 36.8    Assessment/Plan: Status post Cesarean section. Doing well postoperatively.  Discharge home with standard precautions and return to clinic in 2 weeks.  HARPER,CHARLES A 05/29/2012, 8:25 AM

## 2012-05-31 ENCOUNTER — Encounter (HOSPITAL_COMMUNITY): Payer: Self-pay | Admitting: *Deleted

## 2012-05-31 ENCOUNTER — Inpatient Hospital Stay (HOSPITAL_COMMUNITY)
Admission: AD | Admit: 2012-05-31 | Discharge: 2012-05-31 | Disposition: A | Discharge status: Home / Self Care | Payer: Medicaid Other | Source: Ambulatory Visit | Attending: Obstetrics | Admitting: Obstetrics

## 2012-05-31 DIAGNOSIS — S60219A Contusion of unspecified wrist, initial encounter: Secondary | ICD-10-CM

## 2012-05-31 DIAGNOSIS — O99893 Other specified diseases and conditions complicating puerperium: Secondary | ICD-10-CM | POA: Insufficient documentation

## 2012-05-31 DIAGNOSIS — W19XXXA Unspecified fall, initial encounter: Secondary | ICD-10-CM

## 2012-05-31 DIAGNOSIS — W108XXA Fall (on) (from) other stairs and steps, initial encounter: Secondary | ICD-10-CM | POA: Insufficient documentation

## 2012-05-31 DIAGNOSIS — Y92009 Unspecified place in unspecified non-institutional (private) residence as the place of occurrence of the external cause: Secondary | ICD-10-CM | POA: Insufficient documentation

## 2012-05-31 LAB — CBC
HCT: 32.2 % — ABNORMAL LOW (ref 36.0–46.0)
Hemoglobin: 10.8 g/dL — ABNORMAL LOW (ref 12.0–15.0)
MCH: 27.5 pg (ref 26.0–34.0)
MCHC: 33.5 g/dL (ref 30.0–36.0)
RDW: 16.1 % — ABNORMAL HIGH (ref 11.5–15.5)

## 2012-05-31 NOTE — Discharge Summary (Signed)
Obstetric Discharge Summary Reason for Admission: cesarean section Prenatal Procedures: ultrasound Intrapartum Procedures: cesarean: low cervical, transverse Postpartum Procedures: none Complications-Operative and Postpartum: none  Hemoglobin  Date Value Range Status  05/28/2012 10.5* 12.0 - 15.0 g/dL Final     HCT  Date Value Range Status  05/28/2012 31.4* 36.0 - 46.0 % Final    Physical Exam:  General: alert and no distress Lochia: appropriate Uterine: firm Incision: clean, dry and intact DVT Evaluation: No evidence of DVT seen on physical exam.  Discharge Diagnoses: Active Problems:  * No active hospital problems. *    Discharge Information: Date: 05/31/2012 Activity: pelvic rest Diet: routine Medications:  Prior to Admission medications   Medication Sig Start Date End Date Taking? Authorizing Provider  Prenatal Vit-Fe Fumarate-FA (PRENATAL MULTIVITAMIN) TABS Take 1 tablet by mouth daily.   Yes Historical Provider, MD  ibuprofen (ADVIL,MOTRIN) 600 MG tablet Take 1 tablet (600 mg total) by mouth every 6 (six) hours. 05/29/12   [name], MD  oxyCODONE-acetaminophen (PERCOCET/ROXICET) 5-325 MG per tablet Take 1-2 tablets by mouth every 4 (four) hours as needed for pain (moderate - severe pain). 05/29/12   [name], MD    Condition: stable Instructions: refer to routine discharge instructions Discharge to: home Follow-up Information    Please follow up. (Call MD tomorrow morning for appt)          Newborn Data: Live born This patient has no babies on file.; APGAR (1 MIN): This patient has no babies on file.  APGAR (5 MINS): This patient has no babies on file.  APGAR (10 MINS): This patient has no babies on file. DBLINK(ept,19405,,1,,)@, ; weight ;  Home with mother.  HARPER,CHARLES A 05/31/2012, 4:56 AM

## 2012-05-31 NOTE — MAU Note (Signed)
I had repeat C/S this past Monday. Was going down steps and fell. Was closer to top of stairs and fell all the way down but don't really remember how I fell. Did hit R side of stomach. Had stopped bleeding vaginally but now having some vaginal bleeding since the fall. No bleeding from incision although L side is alittle swollen

## 2012-05-31 NOTE — MAU Provider Note (Signed)
Chief Complaint: Fall  First Provider Initiated Contact with Patient 05/31/12 2014    SUBJECTIVE HPI: Angela [name] is a 27 y.o. [id number] at 4 days S/P RLTCS who presents with fall from near the top of a flight of stairs. Denies dizziness preceding fall, but is not sure why she fell. Initially stated that she just found herself at the bottom of the stairs, but upon further questioning she states she does not think she lost consciousness. She stated that the fall happened a few hours before coming to MAU, but then stated that it happened before she took of Ibuprofen at 1:30 pm. States she hit her right knee, right upper abd, right wrist, upper lip and right eye brow. Scant VB since fall. No bleeding from incision, but reports mild swelling at left side of incision. Low back now sore, rates pin 7/10. Declines pain meds. Denies being pushed or taking sedating meds before fall.    Past Medical History  Diagnosis Date  . Anemia   . No pertinent past medical history   . Anginal pain     within the past week Dr. Clearance Coots heart burn  . Headache    OB History    Grav Para Term Preterm Abortions TAB SAB Ect Mult Living   6 3 3  3 3    3      # Outc Date GA Lbr Len/2nd Wgt Sex Del Anes PTL Lv   1 TRM 1/14 [redacted]w[redacted]d 00:00 7lb10.4oz(3.47kg) M LTCS Spinal  Yes   2 TRM      SVD      3 TRM      LTCS      4 TAB            5 TAB            6 TAB              Past Surgical History  Procedure Date  . Cesarean section   . Cesarean section 05/27/2012    Procedure: CESAREAN SECTION;  Surgeon: Antionette Char, MD;  Location: [facility];  Service: Obstetrics;  Laterality: N/A;  Repeat cesarean section with delivery of baby boy at 47.    History   Social History  . Marital Status: Single    Spouse Name: N/A    Number of Children: N/A  . Years of Education: N/A   Occupational History  . Not on file.   Social History Main Topics  . Smoking status: Never Smoker   . Smokeless tobacco: Never Used  .  Alcohol Use: No  . Drug Use: No  . Sexually Active: Yes    Birth Control/ Protection: None   Other Topics Concern  . Not on file   Social History Narrative  . No narrative on file   No current facility-administered medications on file prior to encounter.   Current Outpatient Prescriptions on File Prior to Encounter  Medication Sig Dispense Refill  . ibuprofen (ADVIL,MOTRIN) 600 MG tablet Take 1 tablet (600 mg total) by mouth every 6 (six) hours.  30 tablet  5  . Prenatal Vit-Fe Fumarate-FA (PRENATAL MULTIVITAMIN) TABS Take 1 tablet by mouth daily.      Marland Kitchen oxyCODONE-acetaminophen (PERCOCET/ROXICET) 5-325 MG per tablet Take 1-2 tablets by mouth every 4 (four) hours as needed for pain (moderate - severe pain).  40 tablet  0   No Known Allergies  ROS: Pertinent items in HPI  OBJECTIVE Blood pressure 113/66, pulse 76, temperature 98.5 F (36.9 C),  resp. rate 20, height 5\' 2"  (1.575 m), weight 87 kg (191 lb 12.8 oz), last menstrual period 08/17/2011, currently breastfeeding. GENERAL: Well-developed, well-nourished female in no acute distress.  HEENT: Normocephalic. Mild bruising and swelling on upper lip. No bruising, swelling or tenderness on right eye brow. HEART: normal rate RESP: normal effort ABDOMEN: Soft, non-tender. Incision C/D/I. sterisrips in place. EXTREMITIES: Right wrist tender w/ mvmt. ROM limited by pain. Mild swelling. No briusing. Hemostatic, tender abrasion on right knee. No edema. Abale to bare weight.  Normal gate.  NEURO: Alert and oriented x 4. Normal thought content. No post-ictal appearing. SPECULUM EXAM: deferred  LAB RESULTS Results for orders placed during the hospital encounter of 05/31/12 (from the past 24 hour(s))  CBC     Status: Abnormal   Collection Time   05/31/12  8:41 PM      Component Value Range   WBC 7.7  4.0 - 10.5 K/uL   RBC 3.93  3.87 - 5.11 MIL/uL   Hemoglobin 10.8 (*) 12.0 - 15.0 g/dL   HCT 16.1 (*) 09.6 - 04.5 %   MCV 81.9  78.0 -  100.0 fL   MCH 27.5  26.0 - 34.0 pg   MCHC 33.5  30.0 - 36.0 g/dL   RDW 40.9 (*) 81.1 - 91.4 %   Platelets 250  150 - 400 K/uL    IMAGING  MAU COURSE Dr. Clearance Coots notified of fall. No intervention needed.   ASSESSMENT Fall  PLAN Discharge home. Comfort measures. Ice 1st 24 hours followed by heat PRN for soreness.      Follow-up Information    Follow up with HARPER,CHARLES A, MD. (as scheduled)    Contact information:   [address] [place] [address] [phone]       Follow up with [facility] [place] MATERNITY ADMISSIONS. (As needed if symptoms worsen)    Contact information:   [address] [id number] mc [place] [address] [phone]          Medication List     As of 06/01/2012  5:39 AM    TAKE these medications         ibuprofen 600 MG tablet   Commonly known as: ADVIL,MOTRIN   Take 1 tablet (600 mg total) by mouth every 6 (six) hours.      oxyCODONE-acetaminophen 5-325 MG per tablet   Commonly known as: PERCOCET/ROXICET   Take 1-2 tablets by mouth every 4 (four) hours as needed for pain (moderate - severe pain).      prenatal multivitamin Tabs   Take 1 tablet by mouth daily.        [place], CNM 05/31/2012  8:11 PM

## 2012-08-06 ENCOUNTER — Ambulatory Visit: Payer: Self-pay | Admitting: Obstetrics

## 2012-09-16 ENCOUNTER — Ambulatory Visit (INDEPENDENT_AMBULATORY_CARE_PROVIDER_SITE_OTHER): Payer: Medicaid Other | Admitting: Obstetrics

## 2012-09-16 ENCOUNTER — Encounter: Payer: Self-pay | Admitting: Obstetrics

## 2012-09-16 VITALS — BP 107/73 | HR 82 | Temp 97.9°F | Wt 163.0 lb

## 2012-09-16 DIAGNOSIS — Z975 Presence of (intrauterine) contraceptive device: Secondary | ICD-10-CM

## 2012-09-16 DIAGNOSIS — N946 Dysmenorrhea, unspecified: Secondary | ICD-10-CM

## 2012-09-16 MED ORDER — OXYCODONE-ACETAMINOPHEN 10-325 MG PO TABS: 1.0000 | ORAL_TABLET | ORAL | Status: DC | PRN

## 2012-09-16 NOTE — Progress Notes (Signed)
Subjective:     Angela [name] is a 27 y.o. female here for a routine exam.  Current complaints: Pt here today for a Nexplanon follow up. Pt states she is having pain in her sharp and pulsating pain in her stomach. Pt states she is also having pain in her legs. Pt is requesting a pregnancy test. Personal health questionnaire reviewed: yes.   Gynecologic History No LMP recorded. Patient has had an implant. Contraception: Nexplanon   Obstetric History OB History   Grav Para Term Preterm Abortions TAB SAB Ect Mult Living   6 3 3  3 3    3      # Outc Date GA Lbr Len/2nd Wgt Sex Del Anes PTL Lv   1 TRM 1/14 [redacted]w[redacted]d 00:00 7lb10.4oz(3.47kg) M LTCS Spinal  Yes   2 TRM      SVD      3 TRM      LTCS      4 TAB            5 TAB            6 TAB                The following portions of the patient's history were reviewed and updated as appropriate: allergies, current medications, past family history, past medical history, past social history, past surgical history and problem list.  Review of Systems Pertinent items are noted in HPI.    Objective:    Extremities: Left arm Nexplanon insertion site clean, nontender.  Rod palpated, intact, in good superficial subQ position.  Assessment:    Healthy female exam.  Nexplanon Surveillance. Dysmenorrhea   Plan:    Education reviewed: safe sex/STD prevention and Nexplanon. F/U for annual exam.   Percocet Rx

## 2012-09-17 NOTE — Patient Instructions (Addendum)
Contraception Dysmenorrhea

## 2012-12-05 ENCOUNTER — Emergency Department (HOSPITAL_COMMUNITY): Payer: Self-pay

## 2012-12-05 ENCOUNTER — Encounter (HOSPITAL_COMMUNITY): Payer: Self-pay | Admitting: Nurse Practitioner

## 2012-12-05 ENCOUNTER — Other Ambulatory Visit: Payer: Self-pay

## 2012-12-05 ENCOUNTER — Emergency Department (HOSPITAL_COMMUNITY)
Admission: EM | Admit: 2012-12-05 | Discharge: 2012-12-05 | Disposition: A | Discharge status: Home / Self Care | Payer: Self-pay | Attending: Emergency Medicine | Admitting: Emergency Medicine

## 2012-12-05 DIAGNOSIS — Z862 Personal history of diseases of the blood and blood-forming organs and certain disorders involving the immune mechanism: Secondary | ICD-10-CM | POA: Insufficient documentation

## 2012-12-05 DIAGNOSIS — A499 Bacterial infection, unspecified: Secondary | ICD-10-CM | POA: Insufficient documentation

## 2012-12-05 DIAGNOSIS — N760 Acute vaginitis: Secondary | ICD-10-CM | POA: Insufficient documentation

## 2012-12-05 DIAGNOSIS — R079 Chest pain, unspecified: Secondary | ICD-10-CM | POA: Insufficient documentation

## 2012-12-05 DIAGNOSIS — B9689 Other specified bacterial agents as the cause of diseases classified elsewhere: Secondary | ICD-10-CM | POA: Insufficient documentation

## 2012-12-05 DIAGNOSIS — R0602 Shortness of breath: Secondary | ICD-10-CM | POA: Insufficient documentation

## 2012-12-05 DIAGNOSIS — Z3202 Encounter for pregnancy test, result negative: Secondary | ICD-10-CM | POA: Insufficient documentation

## 2012-12-05 DIAGNOSIS — N946 Dysmenorrhea, unspecified: Secondary | ICD-10-CM | POA: Insufficient documentation

## 2012-12-05 DIAGNOSIS — R109 Unspecified abdominal pain: Secondary | ICD-10-CM | POA: Insufficient documentation

## 2012-12-05 DIAGNOSIS — Z8679 Personal history of other diseases of the circulatory system: Secondary | ICD-10-CM | POA: Insufficient documentation

## 2012-12-05 DIAGNOSIS — R091 Pleurisy: Secondary | ICD-10-CM | POA: Insufficient documentation

## 2012-12-05 LAB — COMPREHENSIVE METABOLIC PANEL
AST: 17 U/L (ref 0–37)
Albumin: 4.3 g/dL (ref 3.5–5.2)
Calcium: 9.7 mg/dL (ref 8.4–10.5)
Creatinine, Ser: 0.73 mg/dL (ref 0.50–1.10)
GFR calc non Af Amer: >90 mL/min (ref >90)
Total Protein: 8.5 g/dL — ABNORMAL HIGH (ref 6.0–8.3)

## 2012-12-05 LAB — URINALYSIS, ROUTINE W REFLEX MICROSCOPIC
Hgb urine dipstick: NEGATIVE
Specific Gravity, Urine: 1.018 (ref 1.005–1.030)
Urobilinogen, UA: 0.2 mg/dL (ref 0.0–1.0)
pH: 5.5 (ref 5.0–8.0)

## 2012-12-05 LAB — WET PREP, GENITAL: Yeast Wet Prep HPF POC: NONE SEEN

## 2012-12-05 LAB — CBC WITH DIFFERENTIAL/PLATELET
Basophils Absolute: 0 10*3/uL (ref 0.0–0.1)
Basophils Relative: 0 % (ref 0–1)
Eosinophils Absolute: 0.1 10*3/uL (ref 0.0–0.7)
Eosinophils Relative: 1 % (ref 0–5)
HCT: 40.8 % (ref 36.0–46.0)
MCHC: 34.3 g/dL (ref 30.0–36.0)
MCV: 81.4 fL (ref 78.0–100.0)
Monocytes Absolute: 0.4 10*3/uL (ref 0.1–1.0)
Platelets: 304 10*3/uL (ref 150–400)
RDW: 14.2 % (ref 11.5–15.5)

## 2012-12-05 LAB — D-DIMER, QUANTITATIVE: D-Dimer, Quant: 0.67 ug/mL-FEU — ABNORMAL HIGH (ref 0.00–0.48)

## 2012-12-05 MED ORDER — MORPHINE SULFATE 4 MG/ML IJ SOLN
4.0000 mg | Freq: Once | INTRAMUSCULAR | Status: AC
  Administered 2012-12-05: 4 mg via INTRAVENOUS
  Filled 2012-12-05: qty 1

## 2012-12-05 MED ORDER — IOHEXOL 350 MG/ML SOLN
100.0000 mL | Freq: Once | INTRAVENOUS | Status: AC | PRN
  Administered 2012-12-05: 100 mL via INTRAVENOUS

## 2012-12-05 MED ORDER — OXYCODONE-ACETAMINOPHEN 10-325 MG PO TABS: 1.0000 | ORAL_TABLET | ORAL | Status: DC | PRN

## 2012-12-05 MED ORDER — ONDANSETRON HCL 4 MG/2ML IJ SOLN
4.0000 mg | Freq: Once | INTRAMUSCULAR | Status: AC
  Administered 2012-12-05: 4 mg via INTRAVENOUS
  Filled 2012-12-05: qty 2

## 2012-12-05 NOTE — Progress Notes (Signed)
[facility] has seen patient. Patient stated that the only doctor she had was at the [facility]. Pt stated that she was going to go renew her medicaid. Provided her with a [facility] [employer] relations account executive.

## 2012-12-05 NOTE — ED Notes (Signed)
Per pt:  One week ago, pt states that she began having lower abdominal pressure, sharp pins and needle-like pain associated with left flank pain.  Pt has had nausea and diarrhea on and off for a week.  Pt has had a very bed headache and dizziness when she lays on her left side.  Pt has not taken any medication for this.

## 2012-12-05 NOTE — ED Provider Notes (Signed)
History    CSN: [id number] Arrival date & time 12/05/12  1220  First MD Initiated Contact with Patient 12/05/12 1249     Chief Complaint  Patient presents with  . Flank Pain    left  . Abdominal Pain    lower abdomen; pressure and sharp pains    (Consider location/radiation/quality/duration/timing/severity/associated sxs/prior Treatment) HPI  Angela [name] is a(n) 27 y.o. female who presents to the ED with complaint of abdominal pain, chest pain and SOB. Patient states that she has had chronic BL lower quadrant abdominal pain since her c section 5 months ago. She states that she has been on percocet since that time and wonders why she is still having pain and would like to stop taking her pain meds. She says that she has run out of her pain meds prescribed by her pcp and her pain always comes back. She denies urinary sxs or vaginal sxs. She states that yesterday she began having left flank pain, left sided chest pain and dyspnea. She describes the dyspnea and chest pain as intermittent, worse when lying flat or on her left side. Better when sitting up.  Patient has Norplant implant for birth control.  She denies a history of PE or DVT, however she has had left calf pain 2 weeks ago that lasted for several days.  She denies any swelling.  The patient denies any recent upper respiratory infections, fevers, chills, nausea, vomiting, diarrhea or constipation.  Patient denies any hemoptysis or recent confinement.  She has no recent surgeries. Patient has  Not states she has spotting daily.had a normal period since giving birth.  She is nursing her child.  She has vaginal spotting daily. Past Medical History  Diagnosis Date  . Anemia   . No pertinent past medical history   . Anginal pain     within the past week Dr. Clearance Coots heart burn  . [id number](478.2)    Past Surgical History  Procedure Laterality Date  . Cesarean section    . Cesarean section  05/27/2012    Procedure: CESAREAN SECTION;   Surgeon: Antionette Char, MD;  Location: [facility];  Service: Obstetrics;  Laterality: N/A;  Repeat cesarean section with delivery of baby boy at 55.    Family History  Problem Relation Age of Onset  . Other Neg Hx   . Cancer Father    History  Substance Use Topics  . Smoking status: Never Smoker   . Smokeless tobacco: Never Used  . Alcohol Use: No   OB History   Grav Para Term Preterm Abortions TAB SAB Ect Mult Living   6 3 3  3 3    3      Review of Systems Ten systems reviewed and are negative for acute change, except as noted in the HPI.   Allergies  Review of patient's allergies indicates no known allergies.  Home Medications   Current Outpatient Rx  Name  Route  Sig  Dispense  Refill  . oxyCODONE-acetaminophen (PERCOCET) 10-325 MG per tablet   Oral   Take 1 tablet by mouth every 4 (four) hours as needed for pain.   40 tablet   0    BP 118/66  Pulse 59  Temp(Src) 98.8 F (37.1 C) (Oral)  Resp 20  SpO2 100% Physical Exam Physical Exam  Nursing note and vitals reviewed. Constitutional: She is oriented to person, place, and time. She appears well-developed and well-nourished. No distress.  HENT:  Head: Normocephalic and atraumatic.  Eyes: Conjunctivae  normal and EOM are normal. Pupils are equal, round, and reactive to light. No scleral icterus.  Neck: Normal range of motion.  Cardiovascular: Normal rate, regular rhythm and normal heart sounds.  Exam reveals no gallop and no friction rub.   No murmur heard. Pulmonary/Chest: Effort normal and breath sounds normal. No respiratory distress.  Abdominal: Soft. Bowel sounds are normal. She exhibits no distension and no mass. There is no tenderness. There is no guarding.  Neurological: She is alert and oriented to person, place, and time.  Skin: Skin is warm and dry. She is not diaphoretic.  Pelvic exam: normal external genitalia, vulva, vagina, cervix, uterus and adnexa, tenderness BL adnexa.    ED Course   Procedures (including critical care time) Labs Reviewed - No data to display No results found. 1. Abdominal pain   2. Pleurisy   3. Dysmenorrhea   4. BV (bacterial vaginosis)     MDM  3:21 PM Patient with elevated d-dimer. Tenderness without abnormality on pelvic exam. I explained the elevated d-dimer and possibility of PE given her history. Patient has consented to get CT angio chest.  5:13 PM Patient without pulmonary embolism. Normal EKG.  Labs are unremarkable except for clue cells on wet prep. i will discharge. The patient to follow up with her pcp. Refill pain meds.  Arthor Captain, PA-C 12/06/12 1732

## 2012-12-05 NOTE — ED Notes (Signed)
PA at bedside.

## 2012-12-09 NOTE — ED Provider Notes (Signed)
Medical screening examination/treatment/procedure(s) were performed by non-physician practitioner and as supervising physician I was immediately available for consultation/collaboration.   Gerhard Munch, MD 12/09/12 [phone]

## 2013-06-21 ENCOUNTER — Encounter (HOSPITAL_COMMUNITY): Payer: Self-pay | Admitting: Emergency Medicine

## 2013-06-21 ENCOUNTER — Emergency Department (HOSPITAL_COMMUNITY): Payer: Medicaid Other

## 2013-06-21 ENCOUNTER — Emergency Department (HOSPITAL_COMMUNITY)
Admission: EM | Admit: 2013-06-21 | Discharge: 2013-06-21 | Disposition: A | Discharge status: Home / Self Care | Payer: Medicaid Other | Attending: Emergency Medicine | Admitting: Emergency Medicine

## 2013-06-21 DIAGNOSIS — Z862 Personal history of diseases of the blood and blood-forming organs and certain disorders involving the immune mechanism: Secondary | ICD-10-CM | POA: Insufficient documentation

## 2013-06-21 DIAGNOSIS — R519 Headache, unspecified: Secondary | ICD-10-CM

## 2013-06-21 DIAGNOSIS — I209 Angina pectoris, unspecified: Secondary | ICD-10-CM | POA: Insufficient documentation

## 2013-06-21 DIAGNOSIS — R51 Headache: Secondary | ICD-10-CM | POA: Insufficient documentation

## 2013-06-21 DIAGNOSIS — Z3202 Encounter for pregnancy test, result negative: Secondary | ICD-10-CM | POA: Insufficient documentation

## 2013-06-21 LAB — POCT PREGNANCY, URINE: Preg Test, Ur: NEGATIVE

## 2013-06-21 MED ORDER — DEXAMETHASONE SODIUM PHOSPHATE 10 MG/ML IJ SOLN
10.0000 mg | Freq: Once | INTRAMUSCULAR | Status: AC
Start: 2013-06-21 — End: 2013-06-21
  Administered 2013-06-21: 10 mg via INTRAMUSCULAR
  Filled 2013-06-21: qty 1

## 2013-06-21 MED ORDER — IBUPROFEN 800 MG PO TABS
800.0000 mg | ORAL_TABLET | Freq: Once | ORAL | Status: AC
  Administered 2013-06-21: 800 mg via ORAL
  Filled 2013-06-21: qty 1

## 2013-06-21 MED ORDER — METOCLOPRAMIDE HCL 5 MG/ML IJ SOLN
10.0000 mg | Freq: Once | INTRAMUSCULAR | Status: AC
  Administered 2013-06-21: 10 mg via INTRAMUSCULAR
  Filled 2013-06-21: qty 2

## 2013-06-21 MED ORDER — DIPHENHYDRAMINE HCL 50 MG/ML IJ SOLN
25.0000 mg | Freq: Once | INTRAMUSCULAR | Status: AC
  Administered 2013-06-21: 25 mg via INTRAMUSCULAR
  Filled 2013-06-21: qty 1

## 2013-06-21 MED ORDER — IBUPROFEN 600 MG PO TABS: 600.0000 mg | ORAL_TABLET | Freq: Four times a day (QID) | ORAL | Status: DC | PRN

## 2013-06-21 NOTE — ED Notes (Signed)
Pt. reports left side headache onset yesterday , denies injury . Alert and oriented , slight headache .

## 2013-06-21 NOTE — ED Notes (Signed)
Patient transported to CT 

## 2013-06-21 NOTE — ED Provider Notes (Signed)
CSN: [id number]     Arrival date & time 06/21/13  1952 History   First MD Initiated Contact with Patient 06/21/13 2019     Chief Complaint  Patient presents with  . Headache   (Consider location/radiation/quality/duration/timing/severity/associated sxs/prior Treatment) HPI Comments: 28 year old female with no significant past medical history presents for left-sided head pain. Patient states that the pain is sharp in nature and mildly pressure-like. She states the pain has been present for 2 days and constant without any aggravating factors. Patient has not taken anything for pain relief. She states that when she applies pressure to the area it mildly relieves her discomfort. She states she would not classify her pain as a headache. She denies any head injury or trauma to the area as well as associated fevers, vision changes or vision loss, tinnitus or hearing loss, neck stiffness, nausea or vomiting, photophobia, numbness/tingling, and extremity weakness. Patient endorses a similar pain 1 week ago which resolved spontaneously without intervention; at this time it was located above her left eye.  Patient is a 28 y.o. female presenting with headaches. The history is provided by the patient. No language interpreter was used.  Headache   Past Medical History  Diagnosis Date  . Anemia   . No pertinent past medical history   . Anginal pain     within the past week Dr. Clearance Coots heart burn  . [id number](478.2)    Past Surgical History  Procedure Laterality Date  . Cesarean section    . Cesarean section  05/27/2012    Procedure: CESAREAN SECTION;  Surgeon: Antionette Char, MD;  Location: [facility];  Service: Obstetrics;  Laterality: N/A;  Repeat cesarean section with delivery of baby boy at 38.    Family History  Problem Relation Age of Onset  . Other Neg Hx   . Cancer Father    History  Substance Use Topics  . Smoking status: Never Smoker   . Smokeless tobacco: Never Used  . Alcohol Use: No     OB History   Grav Para Term Preterm Abortions TAB SAB Ect Mult Living   6 3 3  3 3    3      Review of Systems  Neurological: Positive for headaches.  All other systems reviewed and are negative.    Allergies  Review of patient's allergies indicates no known allergies.  Home Medications   Current Outpatient Rx  Name  Route  Sig  Dispense  Refill  . ibuprofen (ADVIL,MOTRIN) 600 MG tablet   Oral   Take 1 tablet (600 mg total) by mouth every 6 (six) hours as needed.   30 tablet   0    BP 108/81  Pulse 90  Temp(Src) 99.2 F (37.3 C) (Oral)  Resp 18  Ht 5\' 2"  (1.575 m)  Wt 170 lb (77.111 kg)  BMI 31.09 kg/m2  SpO2 100%  Physical Exam  Nursing note and vitals reviewed. Constitutional: She is oriented to person, place, and time. She appears well-developed and well-nourished. No distress.  HENT:  Head: Normocephalic and atraumatic. Head is without raccoon's eyes, without Battle's sign, without abrasion, without contusion and without laceration.    Right Ear: Hearing and external ear normal.  Left Ear: Hearing and external ear normal.  Nose: Nose normal.  Mouth/Throat: Uvula is midline, oropharynx is clear and moist and mucous membranes are normal. No oropharyngeal exudate.  Eyes: Conjunctivae and EOM are normal. Pupils are equal, round, and reactive to light. No scleral icterus.  No nystagmus  Neck: Normal range of motion. Neck supple.  Cardiovascular: Normal rate, regular rhythm and normal heart sounds.   Pulmonary/Chest: Effort normal and breath sounds normal. No respiratory distress. She has no wheezes. She has no rales.  Musculoskeletal: Normal range of motion.  Neurological: She is alert and oriented to person, place, and time. She has normal reflexes. No cranial nerve deficit. She exhibits normal muscle tone.  GCS 15. Patient speaks in full goal oriented sentences. Cranial nerves grossly intact; no facial drooping, symmetric eyebrow raise, equal tongue  protrusion, and normal shoulder shrugging. Patient has equal grip strength bilaterally with 5/5 strength against resistance in all extremities. No gross sensory deficits appreciated. Patellar and Achilles reflexes 2+ bilaterally. Patient ambulatory with normal gait.  Skin: Skin is warm and dry. No rash noted. She is not diaphoretic. No erythema. No pallor.  Psychiatric: She has a normal mood and affect. Her behavior is normal.    ED Course  Procedures (including critical care time) Labs Review Labs Reviewed  POCT PREGNANCY, URINE   Imaging Review Ct Head Wo Contrast  06/21/2013   CLINICAL DATA:  Left-sided headache that started yesterday. Dizziness  EXAM: CT HEAD WITHOUT CONTRAST  TECHNIQUE: Contiguous axial images were obtained from the base of the skull through the vertex without intravenous contrast.  COMPARISON:  None.  FINDINGS: Skull and Sinuses:There is a remote appearing blowout fracture of the medial wall right orbit. Patchy mucosal thickening in the imaged paranasal sinuses, with possible effusion in the upper left maxillary antrum. The adenoid tonsillar tissue is prominent, even for age.  Orbits: No acute abnormality.  Brain: No evidence of acute abnormality, such as acute infarction, hemorrhage, hydrocephalus, or mass lesion/mass effect.  IMPRESSION: 1. No acute intracranial abnormality. 2. Sinusitis, with possible acute effusion in the left maxillary antrum.   Electronically Signed   By: Tiburcio Pea M.D.   On: 06/21/2013 21:17    EKG Interpretation   None       MDM   1. Headache    28 year old female with no significant past medical history presents or a headache x2 days as she classifies more as a "pain in the left side of her head". Patient denies any trauma or injury. She denies any history of blood clots, IV drug use, or strokes. Neurologic exam today is nonfocal. No evidence of head deformities or trauma. No nuchal rigidity or meningismus. CT scan ordered for further  evaluation of symptoms it is negative for acute intracranial abnormality. Urine pregnancy negative.  Patient treated in ED with Decadron, Reglan, and Benadryl with improvement in symptoms. 10/10 pain on arrival now down to 5/10 pain. Toward ibuprofen for continued improvement. Patient is resting comfortably in a fully lit room. I discussed these findings with the patient verbalizes understanding. Do not believe additional emergent workup is indicated.  In discussing the findings with the patient she, as an aside, asks whether she may be tested for STDs today. I have asked the patient why she would like to be tested and she states that she is "just concerned". She also then requests a pelvic ultrasound. She states she is in a monogamous married relationship and is only sexually active with one partner, her husband. She denies concern for infedelity. She further denies any fever, vomiting, abdominal pain, vaginal bleeding or discharge, and urinary symptoms. I see no emergent indication for STD testing or treatment today. I have recommended to the patient that she followup with an OB/GYN and the [facility] for this reason  to which she verbalizes understanding. I have offered to schedule her an outpatient pelvic ultrasound which she states she would like.  Patient well and nontoxic appearing, hemodynamically stable, and afebrile. She is stable for discharge today with instruction to take ibuprofen for continued headache symptoms. Have [facility] patient [facility] as well as the resource guide. Return precautions provided and patient agreeable to plan with no unaddressed concerns.   Filed Vitals:   06/21/13 2001 06/21/13 2039 06/21/13 2130 06/21/13 2145  BP: 118/64 121/73 102/69 108/81  Pulse: 97 92 81 90  Temp: 99.2 F (37.3 C)     TempSrc: Oral     Resp: 14 18    Height: 5\' 2"  (1.575 m)     Weight: 170 lb (77.111 kg)      SpO2: 98% 100% 100% 100%       Antony MaduraKelly Farrell Broerman, PA-C 06/21/13 2210

## 2013-06-21 NOTE — Discharge Instructions (Signed)
Headaches, Frequently Asked Questions °MIGRAINE HEADACHES °Q: What is migraine? What causes it? How can I treat it? °A: Generally, migraine headaches begin as a dull ache. Then they develop into a constant, throbbing, and pulsating pain. You may experience pain at the temples. You may experience pain at the front or back of one or both sides of the head. The pain is usually accompanied by a combination of: °· Nausea. °· Vomiting. °· Sensitivity to light and noise. °Some people (about 15%) experience an aura (see below) before an attack. The cause of migraine is believed to be chemical reactions in the brain. Treatment for migraine may include over-the-counter or prescription medications. It may also include self-help techniques. These include relaxation training and biofeedback.  °Q: What is an aura? °A: About 15% of people with migraine get an "aura". This is a sign of neurological symptoms that occur before a migraine headache. You may see wavy or jagged lines, dots, or flashing lights. You might experience tunnel vision or blind spots in one or both eyes. The aura can include visual or auditory hallucinations (something imagined). It may include disruptions in smell (such as strange odors), taste or touch. Other symptoms include: °· Numbness. °· A "pins and needles" sensation. °· Difficulty in recalling or speaking the correct word. °These neurological events may last as long as 60 minutes. These symptoms will fade as the headache begins. °Q: What is a trigger? °A: Certain physical or environmental factors can lead to or "trigger" a migraine. These include: °· Foods. °· Hormonal changes. °· Weather. °· Stress. °It is important to remember that triggers are different for everyone. To help prevent migraine attacks, you need to figure out which triggers affect you. Keep a headache diary. This is a good way to track triggers. The diary will help you talk to your healthcare professional about your condition. °Q: Does  weather affect migraines? °A: Bright sunshine, hot, humid conditions, and drastic changes in barometric pressure may lead to, or "trigger," a migraine attack in some people. But studies have shown that weather does not act as a trigger for everyone with migraines. °Q: What is the link between migraine and hormones? °A: Hormones start and regulate many of your body's functions. Hormones keep your body in balance within a constantly changing environment. The levels of hormones in your body are unbalanced at times. Examples are during menstruation, pregnancy, or menopause. That can lead to a migraine attack. In fact, about three quarters of all women with migraine report that their attacks are related to the menstrual cycle.  °Q: Is there an increased risk of stroke for migraine sufferers? °A: The likelihood of a migraine attack causing a stroke is very remote. That is not to say that migraine sufferers cannot have a stroke associated with their migraines. In persons under age 40, the most common associated factor for stroke is migraine headache. But over the course of a person's normal life span, the occurrence of migraine headache may actually be associated with a reduced risk of dying from cerebrovascular disease due to stroke.  °Q: What are acute medications for migraine? °A: Acute medications are used to treat the pain of the headache after it has started. Examples over-the-counter medications, NSAIDs, ergots, and triptans.  °Q: What are the triptans? °A: Triptans are the newest class of abortive medications. They are specifically targeted to treat migraine. Triptans are vasoconstrictors. They moderate some chemical reactions in the brain. The triptans work on receptors in your brain. Triptans help   to restore the balance of a neurotransmitter called serotonin. Fluctuations in levels of serotonin are thought to be a main cause of migraine.  °Q: Are over-the-counter medications for migraine effective? °A:  Over-the-counter, or "OTC," medications may be effective in relieving mild to moderate pain and associated symptoms of migraine. But you should see your caregiver before beginning any treatment regimen for migraine.  °Q: What are preventive medications for migraine? °A: Preventive medications for migraine are sometimes referred to as "prophylactic" treatments. They are used to reduce the frequency, severity, and length of migraine attacks. Examples of preventive medications include antiepileptic medications, antidepressants, beta-blockers, calcium channel blockers, and NSAIDs (nonsteroidal anti-inflammatory drugs). °Q: Why are anticonvulsants used to treat migraine? °A: During the past few years, there has been an increased interest in antiepileptic drugs for the prevention of migraine. They are sometimes referred to as "anticonvulsants". Both epilepsy and migraine may be caused by similar reactions in the brain.  °Q: Why are antidepressants used to treat migraine? °A: Antidepressants are typically used to treat people with depression. They may reduce migraine frequency by regulating chemical levels, such as serotonin, in the brain.  °Q: What alternative therapies are used to treat migraine? °A: The term "alternative therapies" is often used to describe treatments considered outside the scope of conventional Western medicine. Examples of alternative therapy include acupuncture, acupressure, and yoga. Another common alternative treatment is herbal therapy. Some herbs are believed to relieve headache pain. Always discuss alternative therapies with your caregiver before proceeding. Some herbal products contain arsenic and other toxins. °TENSION HEADACHES °Q: What is a tension-type headache? What causes it? How can I treat it? °A: Tension-type headaches occur randomly. They are often the result of temporary stress, anxiety, fatigue, or anger. Symptoms include soreness in your temples, a tightening band-like sensation  around your head (a "vice-like" ache). Symptoms can also include a pulling feeling, pressure sensations, and contracting head and neck muscles. The headache begins in your forehead, temples, or the back of your head and neck. Treatment for tension-type headache may include over-the-counter or prescription medications. Treatment may also include self-help techniques such as relaxation training and biofeedback. °CLUSTER HEADACHES °Q: What is a cluster headache? What causes it? How can I treat it? °A: Cluster headache gets its name because the attacks come in groups. The pain arrives with little, if any, warning. It is usually on one side of the head. A tearing or bloodshot eye and a runny nose on the same side of the headache may also accompany the pain. Cluster headaches are believed to be caused by chemical reactions in the brain. They have been described as the most severe and intense of any headache type. Treatment for cluster headache includes prescription medication and oxygen. °SINUS HEADACHES °Q: What is a sinus headache? What causes it? How can I treat it? °A: When a cavity in the bones of the face and skull (a sinus) becomes inflamed, the inflammation will cause localized pain. This condition is usually the result of an allergic reaction, a tumor, or an infection. If your headache is caused by a sinus blockage, such as an infection, you will probably have a fever. An x-ray will confirm a sinus blockage. Your caregiver's treatment might include antibiotics for the infection, as well as antihistamines or decongestants.  °REBOUND HEADACHES °Q: What is a rebound headache? What causes it? How can I treat it? °A: A pattern of taking acute headache medications too often can lead to a condition known as "rebound headache."   A pattern of taking too much headache medication includes taking it more than 2 days per week or in excessive amounts. That means more than the label or a caregiver advises. With rebound  headaches, your medications not only stop relieving pain, they actually begin to cause headaches. Doctors treat rebound headache by tapering the medication that is being overused. Sometimes your caregiver will gradually substitute a different type of treatment or medication. Stopping may be a challenge. Regularly overusing a medication increases the potential for serious side effects. Consult a caregiver if you regularly use headache medications more than 2 days per week or more than the label advises. °ADDITIONAL QUESTIONS AND ANSWERS °Q: What is biofeedback? °A: Biofeedback is a self-help treatment. Biofeedback uses special equipment to monitor your body's involuntary physical responses. Biofeedback monitors: °· Breathing. °· Pulse. °· Heart rate. °· Temperature. °· Muscle tension. °· Brain activity. °Biofeedback helps you refine and perfect your relaxation exercises. You learn to control the physical responses that are related to stress. Once the technique has been mastered, you do not need the equipment any more. °Q: Are headaches hereditary? °A: Four out of five (80%) of people that suffer report a family history of migraine. Scientists are not sure if this is genetic or a family predisposition. Despite the uncertainty, a child has a 50% chance of having migraine if one parent suffers. The child has a 75% chance if both parents suffer.  °Q: Can children get headaches? °A: By the time they reach high school, most young people have experienced some type of headache. Many safe and effective approaches or medications can prevent a headache from occurring or stop it after it has begun.  °Q: What type of doctor should I see to diagnose and treat my headache? °A: Start with your primary caregiver. Discuss his or her experience and approach to headaches. Discuss methods of classification, diagnosis, and treatment. Your caregiver may decide to recommend you to a headache specialist, depending upon your symptoms or other  physical conditions. Having diabetes, allergies, etc., may require a more comprehensive and inclusive approach to your headache. The National Headache Foundation will provide, upon request, a list of NHF physician members in your state. °Document Released: 07/29/2003 Document Revised: 07/31/2011 Document Reviewed: 01/06/2008 °ExitCare® Patient Information ©2014 [employer]. ° ° °Emergency Department Resource Guide °1) Find a Doctor and Pay Out of Pocket °Although you won't have to find out who is covered by your insurance plan, it is a good idea to ask around and get recommendations. You will then need to call the office and see if the doctor you have chosen will accept you as a new patient and what types of options they offer for patients who are self-pay. Some doctors offer discounts or will set up payment plans for their patients who do not have insurance, but you will need to ask so you aren't surprised when you get to your appointment. ° °2) Contact Your Local Health Department °Not all health departments have doctors that can see patients for sick visits, but many do, so it is worth a call to see if yours does. If you don't know where your local health department is, you can check in your phone book. The CDC also has a tool to help you locate your state's health department, and many state websites also have listings of all of their local health departments. ° °3) Find a Walk-in Clinic °If your illness is not likely to be very severe or complicated, you may   want to try a walk in clinic. These are popping up all over the country in pharmacies, drugstores, and shopping centers. They're usually staffed by nurse practitioners or physician assistants that have been trained to treat common illnesses and complaints. They're usually fairly quick and inexpensive. However, if you have serious medical issues or chronic medical problems, these are probably not your best option. ° °No Primary Care Doctor: °- Call Health  Connect at  832-8000 - they can help you locate a primary care doctor that  accepts your insurance, provides certain services, etc. °- Physician Referral Service- 1-800-533-3463 ° °Chronic Pain Problems: °Organization         Address  Phone   Notes  °[facility]  (336) 297-2271 Patients need to be referred by their primary care doctor.  ° °Medication Assistance: °Organization         Address  Phone   Notes  °[place] [facility] [address] °[place], [place] [address] (336) 641-8030 --Must be a resident of [place] °-- Must have NO insurance coverage whatsoever (no Medicaid/ Medicare, etc.) °-- The pt. MUST have a primary care doctor that directs their care regularly and follows them in the community °  °[facility]  (866) 331-1348   °[employer]  (888) 892-1162   ° °Agencies that provide inexpensive medical care: °Organization         Address  Phone   Notes  °[facility]  (336) 832-8035   °[facility]    (336) 832-7272   °[facility] [address] °[place], [place] [address] (336) 832-4777   °[facility] [address], °[place] (336) 271-4999   °[facility]    (336) 373-0678   °[facility]    (336) 272-1050   °[facility] ° [address], [place] Phone:  (336) 832-4444, Fax:  (336) 832-4440 Hours of Operation:  9 am - 6 pm, M-F.  Also accepts Medicaid/Medicare and self-pay.  °[facility] ° [address], [place] Phone: (336) 832-3150, Fax: (336) [phone]. Hours of Operation:  8:30 am - 5:30 pm, M-F.  Also accepts Medicaid and self-pay.  °[facility] [place] [address], [place] Phone: (336) 878-6027   °[facility] [address], [place], [place] (336)723-1848, Ext. 123 Mondays & Thursdays: 7-9 AM.  First 15 patients are seen on a first come, first serve basis. °   ° °Medicaid-accepting [place] Providers: ° °Organization         Address  Phone   Notes  °[facility] [address], [place] (336) 641-2100 Also accepts self-pay patients.  °[facility] [address], [place] ° (336) 856-9996   °[facility] [address], [place] (336) 288-8857   °[facility] [address] [place] [address], [place] (336) 299-7000   °Veita Bland [address], [place]  ° (336) 373-1557 Only accepts [place] Access Medicaid patients after they have their name applied to their card.  ° °Self-Pay (no insurance) in [place]: ° °Organization         Address  Phone   Notes  °Sickle Cell Patients, [facility] [address], [place] (336) 832-1970   °[facility] [address], [place] (336)   832-4400   °[facility] [place] ° [address], [place] (336) 992-4800   °[facility]/Dr. Osei-Bonsu ° [address] [place] [address], [place] or [address], [place] (336) 841-8500 Phone number for both [place] and [place] locations is the same.  °[facility] [address], [place] (336) 299-0000   °[facility] [address] [place] [address], [place] or [address] (336) 852-7530 °(336) 878-2260   °[facility] [address], [place] (336) 350-1642, phone; (336) 294-5005, fax Sees patients 1st and 3rd Saturday of every month.  Must not qualify for public or private insurance (i.e. Medicaid, Medicare, [place] Health Choice, Veterans' Benefits) • Household income should be no more than 200% of the poverty level •The clinic cannot treat you if you are pregnant or think you are pregnant • Sexually transmitted diseases are not treated at the clinic.  ° ° °Dental Care: °Organization         Address  Phone  Notes  °[place]  [facility] [address], [place] (336) 641-6152 Accepts children up to age 21 who are enrolled in Medicaid or [place] Health Choice; pregnant women with a Medicaid card; and children who have applied for Medicaid or [place] Health Choice, but were declined, whose parents can pay a reduced fee at time of service.  °[place] [facility] [place]  [address], [place] (336) 641-7733 Accepts children up to age 21 who are enrolled in Medicaid or [place] Health Choice; pregnant women with a Medicaid card; and children who have applied for Medicaid or [place] Health Choice, but were declined, whose parents can pay a reduced fee at time of service.  °[facility] ° [address], [place] (336) 641-4533 Patients are seen by appointment only. Walk-ins are not accepted. [facility] will see patients 18 years of age and older. °Monday - Tuesday (8am-5pm) °Most Wednesdays (8:30-5pm) °$30 per visit, cash only  °[facility] ° [address], [place] (336) 641-4533 Patients are seen by appointment only. Walk-ins are not accepted. [facility] will see patients 18 years of age and older. °One Wednesday Evening (Monthly: Volunteer Based).  $30 per visit, cash only  °[facility]  (919) 537-3737 for adults; Children under age 4, call Graduate Pediatric Dentistry at ([phone]) [phone]. Children aged 4-14, please call (919) [phone] to request a pediatric application. ° Dental services are provided in all areas of dental care including fillings, crowns and bridges, complete and partial dentures, implants, gum treatment, root canals, and extractions. Preventive care is also provided. Treatment is provided to both adults and children. °Patients are selected via a lottery and there is often a waiting list. °  °[facility] [address], °[place] ° (336) 763-8833  [email or web address] °  °[facility] [address], [place], [place] (336)723-1848, Ext. 123 Second and Fourth Thursday of each month, opens at 6:30 AM; Clinic ends at 9 AM.  Patients are seen on a first-come first-served basis, and a limited number are seen during each clinic.  ° °[facility] ° [address], [place], [place] (336) 723-7904   Eligibility Requirements °You must have lived in [place], [place], or [place] counties for at least the last three months. °  You cannot be eligible for state   or federal sponsored healthcare insurance, including Veterans Administration, Medicaid, or Medicare. °  You generally cannot be eligible for healthcare insurance through your employer.  °  How to apply: °Eligibility screenings are held every Tuesday and Wednesday afternoon from 1:00 pm until 4:00 pm. You do not need an appointment for the interview!  °[facility] [address], [place], [place] 336-631-2330   °[facility] [facility]  336-342-8273   °[place]  336-703-3100   °[facility]  336-570-6415   ° °Behavioral Health Resources in the Community: °Intensive Outpatient Programs °Organization         Address  Phone  Notes  °[facility] [address], [place], [place] 336-878-6098   °[facility] [address], [place], [place] 336-832-9800   °[facility] [address], [place], [place] ° 336-882-2125   °[facility] [address],  °[place], [place] 1-800-853-5163 or 336-641-4981   °Substance Abuse Resources °Organization         Address  Phone  Notes  °[facility]  336-882-2125   °[facility]  336-784-9470   °[facility]  336-285-9073   °[facility]  336-845-3988   °Residential & Outpatient Substance Abuse Program  1-800-659-3381   °Psychological Services °Organization          Address  Phone  Notes  °[facility]  336- 832-9600   °[facility]  336- 378-7881   °[facility] [address], [place] 1-800-853-5163 or 336-641-4981   ° °Mobile Crisis Teams °Organization         Address  Phone  Notes  °[facility], Mobile Crisis Care Unit  1-877-626-1772   °Assertive °[facility] ° [address] [place], [place] 336-834-9664   °Sharon DeEsch [address] °[place] 336-554-5454   ° °Self-Help/Support Groups °Organization         Address  Phone             Notes  °[facility] - variety of support groups  336- 373-1402 Call for more information  °Narcotics Anonymous (NA), [facility] [address], °[place]  2 meetings at this location  ° °Residential Treatment Programs °Organization         Address  Phone  Notes  °[facility] [address],    °[place]  1-866-801-8205   °[facility] ° [address], [place], [place] 704-293-8524   °[facility] [address], [place] 336-845-3988 Admissions: 8am-3pm M-F  °[facility] [address]    °[place], [place] 336-841-1104   °[facility] [address], [place], [place] 336-379-7146   °[facility] [address]  °[place], [place] 336-285-9073   °[facility] - Intensive Outpatient [address], [place], [place] 336-852-3033   °[facility] [address]  °[place], [place] 1-877-615-2722 or 336-784-9470   °[facility] [address] [place], [place] 336-227-7417 Accepts Medicaid  °[facility] [address]  ° [place] 1-800-659-3381 Substance Abuse/Addiction Treatment  ° °[place] Behavioral Health Resources °Organization         Address  Phone  Notes  °[facility]  (888) 581-9988   °Julie Brannon, PhD [address] [place], [place]   (  336) 349-5553 or (336) 951-0000   °[facility]   [address] °[place], [place] (336) 349-4454   °[facility] [address], [place], [place] (336) 342-8316 Insurance/Medicaid/sponsorship through [facility]  °[facility] [address]                                    [place], [place] (336) 342-8316 Therapy/tele-psych/case  °[facility] [address]  ° [place], [place] (336) 349-2233    °Dr. Arfeen  (336) 349-4544   °[facility] [place]  [employer] [place] [facility] 1) [address],  °2) [address], [place] °3)  [address], [place] (336) 349-3220 °(336) 342-7768 ° °(336) 342-8140   °[place] [facility] (336) 342-1394 or (336) 342-3537 (After Hours)    ° ° ° °

## 2013-06-22 NOTE — ED Provider Notes (Signed)
Medical screening examination/treatment/procedure(s) were performed by non-physician practitioner and as supervising physician I was immediately available for consultation/collaboration.  EKG Interpretation   None         Viridiana Spaid N Maliah Pyles, DO 06/22/13 0202 

## 2013-06-26 ENCOUNTER — Other Ambulatory Visit (HOSPITAL_COMMUNITY): Payer: Self-pay | Admitting: Emergency Medicine

## 2013-06-26 ENCOUNTER — Ambulatory Visit (HOSPITAL_COMMUNITY)
Admission: RE | Admit: 2013-06-26 | Discharge: 2013-06-26 | Disposition: A | Discharge status: Home / Self Care | Payer: Medicaid Other | Source: Ambulatory Visit | Attending: Emergency Medicine | Admitting: Emergency Medicine

## 2013-06-26 DIAGNOSIS — N949 Unspecified condition associated with female genital organs and menstrual cycle: Secondary | ICD-10-CM | POA: Insufficient documentation

## 2013-06-26 DIAGNOSIS — R102 Pelvic and perineal pain unspecified side: Secondary | ICD-10-CM

## 2014-01-17 ENCOUNTER — Emergency Department (HOSPITAL_COMMUNITY)
Admission: EM | Admit: 2014-01-17 | Discharge: 2014-01-17 | Disposition: A | Discharge status: Home / Self Care | Payer: Medicaid Other | Attending: Emergency Medicine | Admitting: Emergency Medicine

## 2014-01-17 ENCOUNTER — Encounter (HOSPITAL_COMMUNITY): Payer: Self-pay | Admitting: Emergency Medicine

## 2014-01-17 DIAGNOSIS — Z3202 Encounter for pregnancy test, result negative: Secondary | ICD-10-CM | POA: Diagnosis not present

## 2014-01-17 DIAGNOSIS — N938 Other specified abnormal uterine and vaginal bleeding: Secondary | ICD-10-CM | POA: Insufficient documentation

## 2014-01-17 DIAGNOSIS — I209 Angina pectoris, unspecified: Secondary | ICD-10-CM | POA: Diagnosis not present

## 2014-01-17 DIAGNOSIS — N925 Other specified irregular menstruation: Secondary | ICD-10-CM | POA: Diagnosis not present

## 2014-01-17 DIAGNOSIS — Z862 Personal history of diseases of the blood and blood-forming organs and certain disorders involving the immune mechanism: Secondary | ICD-10-CM | POA: Diagnosis not present

## 2014-01-17 DIAGNOSIS — N949 Unspecified condition associated with female genital organs and menstrual cycle: Secondary | ICD-10-CM | POA: Insufficient documentation

## 2014-01-17 DIAGNOSIS — B9689 Other specified bacterial agents as the cause of diseases classified elsewhere: Secondary | ICD-10-CM

## 2014-01-17 DIAGNOSIS — N926 Irregular menstruation, unspecified: Secondary | ICD-10-CM

## 2014-01-17 DIAGNOSIS — R109 Unspecified abdominal pain: Secondary | ICD-10-CM | POA: Diagnosis present

## 2014-01-17 DIAGNOSIS — N760 Acute vaginitis: Secondary | ICD-10-CM | POA: Diagnosis not present

## 2014-01-17 LAB — URINALYSIS, ROUTINE W REFLEX MICROSCOPIC
Bilirubin Urine: NEGATIVE
Glucose, UA: NEGATIVE mg/dL
Hgb urine dipstick: NEGATIVE
KETONES UR: NEGATIVE mg/dL
LEUKOCYTES UA: NEGATIVE
NITRITE: NEGATIVE
PROTEIN: NEGATIVE mg/dL
Specific Gravity, Urine: 1.018 (ref 1.005–1.030)
Urobilinogen, UA: 0.2 mg/dL (ref 0.0–1.0)
pH: 5.5 (ref 5.0–8.0)

## 2014-01-17 LAB — CBC WITH DIFFERENTIAL/PLATELET
BASOS ABS: 0 10*3/uL (ref 0.0–0.1)
Basophils Relative: 0 % (ref 0–1)
Eosinophils Absolute: 0.1 10*3/uL (ref 0.0–0.7)
Eosinophils Relative: 1 % (ref 0–5)
HCT: 38 % (ref 36.0–46.0)
Hemoglobin: 13.4 g/dL (ref 12.0–15.0)
LYMPHS PCT: 36 % (ref 12–46)
Lymphs Abs: 1.6 10*3/uL (ref 0.7–4.0)
MCH: 28.8 pg (ref 26.0–34.0)
MCHC: 35.3 g/dL (ref 30.0–36.0)
MCV: 81.7 fL (ref 78.0–100.0)
Monocytes Absolute: 0.3 10*3/uL (ref 0.1–1.0)
Monocytes Relative: 7 % (ref 3–12)
NEUTROS ABS: 2.5 10*3/uL (ref 1.7–7.7)
NEUTROS PCT: 56 % (ref 43–77)
Platelets: 290 10*3/uL (ref 150–400)
RBC: 4.65 MIL/uL (ref 3.87–5.11)
RDW: 14.3 % (ref 11.5–15.5)
WBC: 4.5 10*3/uL (ref 4.0–10.5)

## 2014-01-17 LAB — COMPREHENSIVE METABOLIC PANEL
ALK PHOS: 37 U/L — AB (ref 39–117)
ALT: 13 U/L (ref 0–35)
AST: 17 U/L (ref 0–37)
Albumin: 3.8 g/dL (ref 3.5–5.2)
Anion gap: 11 (ref 5–15)
BILIRUBIN TOTAL: 0.5 mg/dL (ref 0.3–1.2)
BUN: 11 mg/dL (ref 6–23)
CHLORIDE: 105 meq/L (ref 96–112)
CO2: 22 mEq/L (ref 19–32)
Calcium: 8.8 mg/dL (ref 8.4–10.5)
Creatinine, Ser: 0.77 mg/dL (ref 0.50–1.10)
GFR calc Af Amer: >90 mL/min (ref >90)
GFR calc non Af Amer: >90 mL/min (ref >90)
Glucose, Bld: 88 mg/dL (ref 70–99)
POTASSIUM: 4 meq/L (ref 3.7–5.3)
Sodium: 138 mEq/L (ref 137–147)
Total Protein: 7.6 g/dL (ref 6.0–8.3)

## 2014-01-17 LAB — PREGNANCY, URINE: PREG TEST UR: NEGATIVE

## 2014-01-17 LAB — WET PREP, GENITAL
Trich, Wet Prep: NONE SEEN
Yeast Wet Prep HPF POC: NONE SEEN

## 2014-01-17 LAB — LIPASE, BLOOD: LIPASE: 27 U/L (ref 11–59)

## 2014-01-17 MED ORDER — KETOROLAC TROMETHAMINE 30 MG/ML IJ SOLN
30.0000 mg | Freq: Once | INTRAMUSCULAR | Status: AC
  Administered 2014-01-17: 30 mg via INTRAVENOUS
  Filled 2014-01-17: qty 1

## 2014-01-17 MED ORDER — IBUPROFEN 800 MG PO TABS: 800.0000 mg | ORAL_TABLET | Freq: Three times a day (TID) | ORAL | Status: DC

## 2014-01-17 MED ORDER — METRONIDAZOLE 500 MG PO TABS: 500.0000 mg | ORAL_TABLET | Freq: Two times a day (BID) | ORAL | Status: DC

## 2014-01-17 NOTE — Discharge Instructions (Signed)
Call for a follow up appointment with a Family or Primary Care Provider.  Call a gynecologist for further evaluation of your persistent vaginal bleeding Return if Symptoms worsen.   Take medication as prescribed.    Emergency Department Resource Guide 1) Find a Doctor and Pay Out of Pocket Although you won't have to find out who is covered by your insurance plan, it is a good idea to ask around and get recommendations. You will then need to call the office and see if the doctor you have chosen will accept you as a new patient and what types of options they offer for patients who are self-pay. Some doctors offer discounts or will set up payment plans for their patients who do not have insurance, but you will need to ask so you aren't surprised when you get to your appointment.  2) Contact Your Local Health Department Not all health departments have doctors that can see patients for sick visits, but many do, so it is worth a call to see if yours does. If you don't know where your local health department is, you can check in your phone book. The CDC also has a tool to help you locate your state's health department, and many state websites also have listings of all of their local health departments.  3) Find a Walk-in Clinic If your illness is not likely to be very severe or complicated, you may want to try a walk in clinic. These are popping up all over the country in pharmacies, drugstores, and shopping centers. They're usually staffed by nurse practitioners or physician assistants that have been trained to treat common illnesses and complaints. They're usually fairly quick and inexpensive. However, if you have serious medical issues or chronic medical problems, these are probably not your best option.  No Primary Care Doctor: - Call Health Connect at  [phone] - they can help you locate a primary care doctor that  accepts your insurance, provides certain services, etc. - Physician Referral Service-  [phone]  Chronic Pain Problems: Organization         Address  Phone   Notes  [facility]  [phone] Patients need to be referred by their primary care doctor.   Medication Assistance: Organization         Address  Phone   Notes  [facility] Medication [facility] [address] [place] [place], [place] [address] [phone] --Must be a resident of [facility] -- Must have NO insurance coverage whatsoever (no Medicaid/ Medicare, etc.) -- The pt. MUST have a primary care doctor that directs their care regularly and follows them in the community   MedAssist  [phone]   [employer]  [phone]    Agencies that provide inexpensive medical care: Organization         Address  Phone   Notes  [facility]  [phone]   [facility]    [phone]   [facility] [address] [place], [place] [address] [phone]   [facility] [address] [place] [place] [phone]   Planned Parenthood    [phone]   [facility]    [phone]   [facility]  [address], [place] Phone:  [phone], Fax:  [phone] Hours of Operation:  9 am - 6 pm, M-F.  Also accepts Medicaid/Medicare and self-pay.  [place] for [place], [address], [place] Phone: [phone], Fax: [phone]. Hours of Operation:  8:30 am - 5:30 pm, M-F.  Also accepts Medicaid and self-pay.  [facility] [place] [address] [place] Phone: [phone]   [place], [place], [place] [phone], Ext. [phone] Mondays & Thursdays: 7-9 AM.  First 15 patients are seen on a first come, first serve basis.    [place] Providers:  Organization         Address  Phone   Notes  [facility] [address],  [place] [phone] Also accepts self-pay patients.  [facility] [id number] [place], [place]  [phone]   [place], [address], [place] [phone]   [facility] [address] [place] [phone]   Lucianne Lei [address], [place]   [phone] Only accepts [place] Access [place] patients after they have their name applied to their card.   Self-Pay (no insurance) in [facility]:  Organization         Address  Phone   Notes  Sickle Cell Patients, [facility] [place] [phone]   [facility] Urgent Care [place] [phone]   [facility] [place]  [place], [place], [place] [phone]   [facility]/Dr. Osei-Bonsu  [address], [place] or [address], [place] [phone] Phone number for both [place] and [place] locations is the same.  [facility] [address] [place] [phone]   [facility] [place] [address], [place] or [address] [phone]   [facility] [address], [place] [phone], phone; [phone], fax Sees patients 1st and 3rd Saturday of every month.  Must not qualify for public or private insurance (i.e. Medicaid, Medicare, [facility] Health Choice, Veterans' Benefits)  Household income should be no more than 200% of the poverty level The clinic cannot treat you if you are pregnant or think you are pregnant  Sexually transmitted diseases are not treated at the clinic.    Dental Care: Organization         Address  Phone  Notes  [facility] Department of [facility] [place] [phone] Accepts children up to age 102 who are enrolled in  [place] or [place]; pregnant women with a Medicaid card; and children who have applied for Medicaid or [place] Health Choice, but were declined, whose parents can pay a reduced fee at time of service.  [facility] Department of [facility]  [address], [place] [phone] Accepts children up to age 74 who are enrolled in [place] or [place]; pregnant women with a Medicaid card; and children who have applied for Medicaid or [place] Health Choice, but were declined, whose parents can pay a reduced fee at time of service.  [facility]  [place] [phone] Patients are seen by appointment only. Walk-ins are not accepted. [place] will see patients 40 years of age and older. Monday - Tuesday (8am-5pm) Most Wednesdays (8:30-5pm) $30 per visit, cash  only  [facility] Adult [facility]  [address], [facility] [phone] Patients are seen by appointment only. Walk-ins are not accepted. [place] will see patients 14 years of age and older. One Wednesday Evening (Monthly: Volunteer Based).  $30 per visit, cash only  [place]  [phone] for adults; Children under age 48, call Graduate Pediatric Dentistry at [phone]. Children aged 39-14, please call [phone] to request a pediatric application.  Dental services are provided in all areas of dental care including fillings, crowns and bridges, complete and partial dentures, implants, gum treatment, root canals, and extractions. Preventive care is also provided. Treatment is provided to both adults and children. Patients are selected via a lottery and there is often a waiting list.   [facility] [address] [place]  [phone] [email or web address]   [facility] [address] [place], [place] [phone], Ext. [phone] Second and Fourth Thursday of each month, opens at 6:30  AM; Clinic ends at 9 AM.  Patients are seen on a first-come first-served basis, and a limited number are seen during each clinic.   [facility]  [address] Hillard Danker [place], [place] [phone]   Eligibility Requirements You must have lived in [place], [place], or [place] counties for at least the last three months.   You cannot be eligible for state or federal sponsored [employer], including [employer], [place], or [employer].   You generally cannot be eligible for healthcare insurance through your employer.    How to apply: Eligibility screenings are held every Tuesday and Wednesday afternoon from 1:00 pm until 4:00 pm. You do not need an appointment for the interview!  [facility] [address], [place], [place]   [place]  [place] Department  [place]  [phone]    Behavioral Health Resources in the Community: Intensive Outpatient Programs Organization         Address  Phone  Notes  [place]. [address], [place], [place] [phone]   [facility] Outpatient [address] [place], [place]   ADS: Alcohol & Drug Svcs [address], [place], [place]   [place] [address],  [place], [place] or [phone]   Substance Abuse Resources Organization         Address  Phone  Notes  Alcohol and Drug Services  [phone]   [place]  [phone]   The [place]   Chinita Pester  [phone]   Residential & Outpatient Substance Abuse Program  [phone]   Psychological Services Organization         Address  Phone  Notes  [facility] [place]  [place]  [phone]   [place] [address], [place] or  [phone]    Mobile Crisis Teams Organization         Address  Phone  Notes  [facility], Mobile Crisis Care Unit  [phone]   [facility]  [address] [place], [place]   [place] Levels [address] [place] [phone]    Self-Help/Support Groups Organization         Address  Phone  Notes  [facility] [place] - variety of support groups  [place] Call for more information  Narcotics Anonymous (NA), [facility] [address], [employer] [place]  2 meetings at this location   Special educational needs teacher         Address  Phone  Notes  ASAP Residential Treatment [place],    [place]  [phone]   [facility]  [address] [place] [id number], [place], [place]   [facility] [place], [place] [phone] Admissions: 8am-3pm M-F  Incentives Substance [place] [address]    [place], [place] [phone]   [facility] [address] [place], [place], [place]   [facility] [address],  [place], [place]   [facility] Programs - Intensive Outpatient [address] Kristeen Mans [address], [place], [place]   [facility] ([place] [address].,  [place], [place] [phone] or [phone]   [facility] (RTS) [address], [place], [place] Accepts Medicaid  [facility] [address]  [place] [phone] Substance Abuse/Addiction Treatment   [facility] Organization         Address  Phone  Notes  [facility]  [phone]   Domenic Schwab, PhD [address] Arlis Porta [place], [place]   [phone] or [phone]   [place], [place] [phone]   [facility] [address],  [place], [place] [phone] Insurance/Medicaid/sponsorship through [facility] and Families [address] [place]                                    [place], [place] [phone] [place] [address] [place], [place] [phone]    Dr. Adele Schilder  [phone]   [facility] Dept. 1) [address] [place] 2) [place] 3)  [place] [address], [place] [phone]  [phone]   [place] [phone] or [phone] (After Hours)

## 2014-01-17 NOTE — ED Provider Notes (Signed)
CSN: [id number]     Arrival date & time 01/17/14  1212 History   First MD Initiated Contact with Patient 01/17/14 1234     Chief Complaint  Patient presents with  . Abdominal Pain     (Consider location/radiation/quality/duration/timing/severity/associated sxs/prior Treatment) HPI Comments: The patient is a 28 year old female with past history of anemia presents emergency room chief complaint of abdominal discomfort since 2 days and persistent menses since June. The patient reports at the gastric and mid abdominal discomfort described as pins and needles. Denies aggravating, no change with oral intake.  She reports mild pressure to the left abdomen decreases discomfort. Denies nausea, vomiting, diarrhea, constipation, dysuria, hematuria, abnormal vaginal discharge.  Denies taking any pain medication prior to arrival. Patient has an implant in left upper extremity for birth control placed 2014, no history of abnormal bleeding. NO PCP  Patient is a 28 y.o. female presenting with abdominal pain. The history is provided by the patient. No language interpreter was used.  Abdominal Pain Associated symptoms: no chills, no constipation, no diarrhea, no dysuria, no fever, no hematuria, no nausea, no vaginal discharge and no vomiting     Past Medical History  Diagnosis Date  . Anemia   . No pertinent past medical history   . Anginal pain     within the past week Dr. Clearance Coots heart burn  . [id number](478.2)    Past Surgical History  Procedure Laterality Date  . Cesarean section    . Cesarean section  05/27/2012    Procedure: CESAREAN SECTION;  Surgeon: Antionette Char, MD;  Location: [facility];  Service: Obstetrics;  Laterality: N/A;  Repeat cesarean section with delivery of baby boy at 51.    Family History  Problem Relation Age of Onset  . Other Neg Hx   . Cancer Father    History  Substance Use Topics  . Smoking status: Never Smoker   . Smokeless tobacco: Never Used  . Alcohol Use: No    OB History   Grav Para Term Preterm Abortions TAB SAB Ect Mult Living   Review of Systems  Constitutional: Negative for fever and chills.  Gastrointestinal: Positive for abdominal pain. Negative for nausea, vomiting, diarrhea, constipation, blood in stool, abdominal distention and anal bleeding.  Genitourinary: Positive for menstrual problem. Negative for dysuria, urgency, hematuria and vaginal discharge.      Allergies  Review of patient's allergies indicates no known allergies.  Home Medications   Prior to Admission medications   Not on File   BP 114/70  Pulse 76  Temp(Src) 98.6 F (37 C) (Oral)  Resp 14  Ht  (1.575 m)  Wt 180 lb (81.647 kg)  BMI 32.91 kg/m2  SpO2 100%  LMP 01/17/2014  Breastfeeding? Yes Physical Exam  Nursing note and vitals reviewed. Constitutional: She appears well-developed and well-nourished. No distress.  Cardiovascular: Normal rate and regular rhythm.   Pulmonary/Chest: Effort normal and breath sounds normal. She has no wheezes. She has no rales.  Abdominal: Soft. She exhibits no distension. There is tenderness in the right upper quadrant, epigastric area, periumbilical area and left upper quadrant. There is no rebound, no guarding, no CVA tenderness and negative Murphy's sign.  Minimal tenderness. No rash  Genitourinary: Cervix exhibits no motion tenderness and no friability. Right adnexum displays no mass and no fullness. Left adnexum displays no mass and no fullness. Vaginal discharge found.  Minimal spotting. Chaperone present.  Skin: She is not diaphoretic.    ED Course  Procedures (including critical care time) Labs Review Results for orders placed during the hospital encounter of 01/17/14  WET PREP, GENITAL      Result Value Ref Range   Yeast Wet Prep HPF POC NONE SEEN  NONE SEEN   Trich, Wet Prep NONE SEEN  NONE SEEN   Clue Cells Wet Prep HPF POC MANY (*) NONE SEEN   WBC, Wet Prep HPF POC FEW (*) NONE  SEEN  PREGNANCY, URINE      Result Value Ref Range   Preg Test, Ur NEGATIVE  NEGATIVE  URINALYSIS, ROUTINE W REFLEX MICROSCOPIC      Result Value Ref Range   Color, Urine YELLOW  YELLOW   APPearance CLEAR  CLEAR   Specific Gravity, Urine 1.018  1.005 - 1.030   pH 5.5  5.0 - 8.0   Glucose, UA NEGATIVE  NEGATIVE mg/dL   Hgb urine dipstick NEGATIVE  NEGATIVE   Bilirubin Urine NEGATIVE  NEGATIVE   Ketones, ur NEGATIVE  NEGATIVE mg/dL   Protein, ur NEGATIVE  NEGATIVE mg/dL   Urobilinogen, UA 0.2  0.0 - 1.0 mg/dL   Nitrite NEGATIVE  NEGATIVE   Leukocytes, UA NEGATIVE  NEGATIVE  CBC WITH DIFFERENTIAL      Result Value Ref Range   WBC 4.5  4.0 - 10.5 K/uL   RBC 4.65  3.87 - 5.11 MIL/uL   Hemoglobin 13.4  12.0 - 15.0 g/dL   HCT 16.1  09.6 - 04.5 %   MCV 81.7  78.0 - 100.0 fL   MCH 28.8  26.0 - 34.0 pg   MCHC 35.3  30.0 - 36.0 g/dL   RDW 40.9  81.1 - 91.4 %   Platelets 290  150 - 400 K/uL   Neutrophils Relative % 56  43 - 77 %   Neutro Abs 2.5  1.7 - 7.7 K/uL   Lymphocytes Relative 36  12 - 46 %   Lymphs Abs 1.6  0.7 - 4.0 K/uL   Monocytes Relative 7  3 - 12 %   Monocytes Absolute 0.3  0.1 - 1.0 K/uL   Eosinophils Relative 1  0 - 5 %   Eosinophils Absolute 0.1  0.0 - 0.7 K/uL   Basophils Relative 0  0 - 1 %   Basophils Absolute 0.0  0.0 - 0.1 K/uL  COMPREHENSIVE METABOLIC PANEL      Result Value Ref Range   Sodium 138  137 - 147 mEq/L   Potassium 4.0  3.7 - 5.3 mEq/L   Chloride 105  96 - 112 mEq/L   CO2 22  19 - 32 mEq/L   Glucose, Bld 88  70 - 99 mg/dL   BUN 11  6 - 23 mg/dL   Creatinine, Ser 7.82  0.50 - 1.10 mg/dL   Calcium 8.8  8.4 - 95.6 mg/dL   Total Protein 7.6  6.0 - 8.3 g/dL   Albumin 3.8  3.5 - 5.2 g/dL   AST 17  0 - 37 U/L   ALT 13  0 - 35 U/L   Alkaline Phosphatase 37 (*) 39 - 117 U/L   Total Bilirubin 0.5  0.3 - 1.2 mg/dL   GFR calc non Af Amer >90  >90 mL/min   GFR calc Af Amer >90  >90 mL/min   Anion gap 11  5 - 15  LIPASE, BLOOD      Result Value Ref  Range   Lipase  27  11 - 59 U/L    Imaging Review No results found.   EKG Interpretation None      MDM   Final diagnoses:  Abdominal pain, unspecified abdominal location  BV (bacterial vaginosis)  Menstrual abnormality   Patient here in for a persistent vaginal bleeding, history of implant. Also complains of left sided "pins and needles" minimal discomfort with palpation, no rash seen no nausea, vomiting, diarrhea, afebrile. Labs ordered. UA negative, urine pregnancy negative, CBC and CMP without concerning abnormality.   4:04 PM Reevaluation patient resting comfortably in room, reports resolution of abdominal discomfort. Discussed lab results, imaging results, and treatment plan with the patient. Advised followup with gynecologist for further evaluation of her persistent menses. Return precautions given. Reports understanding and no other concerns at this time.  Patient is stable for discharge at this time. Meds given in ED:  Medications  ketorolac (TORADOL) 30 MG/ML injection 30 mg (30 mg Intravenous Given 01/17/14 1516)    New Prescriptions   IBUPROFEN (ADVIL,MOTRIN) 800 MG TABLET    Take 1 tablet (800 mg total) by mouth 3 (three) times daily.   METRONIDAZOLE (FLAGYL) 500 MG TABLET    Take 1 tablet (500 mg total) by mouth 2 (two) times daily.         Mellody Drown, PA-C 01/17/14 [phone]

## 2014-01-17 NOTE — ED Notes (Signed)
Pt in from home c/o bil upper abd pain described as pin & needles, c/o consistent menses since June 2015, c/o decreased energy, pt denies n/v/d, A&O x4, follows commands, speaks in complete sentences

## 2014-01-19 NOTE — ED Provider Notes (Signed)
Medical screening examination/treatment/procedure(s) were performed by non-physician practitioner and as supervising physician I was immediately available for consultation/collaboration.   EKG Interpretation None       Vanetta Mulders, MD 01/19/14 1116

## 2014-01-20 LAB — GC/CHLAMYDIA PROBE AMP
CT PROBE, AMP APTIMA: NEGATIVE
GC PROBE AMP APTIMA: NEGATIVE

## 2014-02-06 ENCOUNTER — Emergency Department (HOSPITAL_COMMUNITY)
Admission: EM | Admit: 2014-02-06 | Discharge: 2014-02-06 | Disposition: A | Discharge status: Home / Self Care | Payer: Medicaid Other | Attending: Emergency Medicine | Admitting: Emergency Medicine

## 2014-02-06 ENCOUNTER — Encounter (HOSPITAL_COMMUNITY): Payer: Self-pay | Admitting: Emergency Medicine

## 2014-02-06 DIAGNOSIS — Z862 Personal history of diseases of the blood and blood-forming organs and certain disorders involving the immune mechanism: Secondary | ICD-10-CM | POA: Insufficient documentation

## 2014-02-06 DIAGNOSIS — Z8679 Personal history of other diseases of the circulatory system: Secondary | ICD-10-CM | POA: Insufficient documentation

## 2014-02-06 DIAGNOSIS — R109 Unspecified abdominal pain: Secondary | ICD-10-CM | POA: Diagnosis not present

## 2014-02-06 DIAGNOSIS — N898 Other specified noninflammatory disorders of vagina: Secondary | ICD-10-CM | POA: Diagnosis not present

## 2014-02-06 DIAGNOSIS — R112 Nausea with vomiting, unspecified: Secondary | ICD-10-CM | POA: Insufficient documentation

## 2014-02-06 DIAGNOSIS — Z3202 Encounter for pregnancy test, result negative: Secondary | ICD-10-CM | POA: Diagnosis not present

## 2014-02-06 DIAGNOSIS — R1030 Lower abdominal pain, unspecified: Secondary | ICD-10-CM

## 2014-02-06 LAB — CBC WITH DIFFERENTIAL/PLATELET
Basophils Absolute: 0 10*3/uL (ref 0.0–0.1)
Basophils Relative: 1 % (ref 0–1)
EOS PCT: 1 % (ref 0–5)
Eosinophils Absolute: 0 10*3/uL (ref 0.0–0.7)
HCT: 38 % (ref 36.0–46.0)
Hemoglobin: 13.3 g/dL (ref 12.0–15.0)
Lymphocytes Relative: 31 % (ref 12–46)
Lymphs Abs: 1.4 10*3/uL (ref 0.7–4.0)
MCH: 28.1 pg (ref 26.0–34.0)
MCHC: 35 g/dL (ref 30.0–36.0)
MCV: 80.3 fL (ref 78.0–100.0)
Monocytes Absolute: 0.4 10*3/uL (ref 0.1–1.0)
Monocytes Relative: 9 % (ref 3–12)
Neutro Abs: 2.6 10*3/uL (ref 1.7–7.7)
Neutrophils Relative %: 58 % (ref 43–77)
PLATELETS: 272 10*3/uL (ref 150–400)
RBC: 4.73 MIL/uL (ref 3.87–5.11)
RDW: 13.8 % (ref 11.5–15.5)
WBC: 4.4 10*3/uL (ref 4.0–10.5)

## 2014-02-06 LAB — COMPREHENSIVE METABOLIC PANEL
ALT: 22 U/L (ref 0–35)
AST: 24 U/L (ref 0–37)
Albumin: 4 g/dL (ref 3.5–5.2)
Alkaline Phosphatase: 36 U/L — ABNORMAL LOW (ref 39–117)
Anion gap: 17 — ABNORMAL HIGH (ref 5–15)
BUN: 8 mg/dL (ref 6–23)
CALCIUM: 9.2 mg/dL (ref 8.4–10.5)
CO2: 22 meq/L (ref 19–32)
Chloride: 103 mEq/L (ref 96–112)
Creatinine, Ser: 0.74 mg/dL (ref 0.50–1.10)
GFR calc Af Amer: >90 mL/min (ref >90)
GFR calc non Af Amer: >90 mL/min (ref >90)
Glucose, Bld: 115 mg/dL — ABNORMAL HIGH (ref 70–99)
Potassium: 3.6 mEq/L — ABNORMAL LOW (ref 3.7–5.3)
SODIUM: 142 meq/L (ref 137–147)
TOTAL PROTEIN: 8.1 g/dL (ref 6.0–8.3)
Total Bilirubin: 0.9 mg/dL (ref 0.3–1.2)

## 2014-02-06 LAB — URINALYSIS, ROUTINE W REFLEX MICROSCOPIC
Bilirubin Urine: NEGATIVE
GLUCOSE, UA: NEGATIVE mg/dL
HGB URINE DIPSTICK: NEGATIVE
Ketones, ur: NEGATIVE mg/dL
Leukocytes, UA: NEGATIVE
Nitrite: NEGATIVE
Protein, ur: NEGATIVE mg/dL
SPECIFIC GRAVITY, URINE: 1.018 (ref 1.005–1.030)
Urobilinogen, UA: 1 mg/dL (ref 0.0–1.0)
pH: 7 (ref 5.0–8.0)

## 2014-02-06 LAB — WET PREP, GENITAL
CLUE CELLS WET PREP: NONE SEEN
TRICH WET PREP: NONE SEEN
WBC WET PREP: NONE SEEN
Yeast Wet Prep HPF POC: NONE SEEN

## 2014-02-06 LAB — PREGNANCY, URINE: PREG TEST UR: NEGATIVE

## 2014-02-06 MED ORDER — HYDROMORPHONE HCL 1 MG/ML IJ SOLN
1.0000 mg | Freq: Once | INTRAMUSCULAR | Status: AC
  Administered 2014-02-06: 1 mg via INTRAVENOUS
  Filled 2014-02-06: qty 1

## 2014-02-06 MED ORDER — ONDANSETRON HCL 4 MG/2ML IJ SOLN
4.0000 mg | Freq: Once | INTRAMUSCULAR | Status: AC
Start: 2014-02-06 — End: 2014-02-06
  Administered 2014-02-06: 4 mg via INTRAVENOUS
  Filled 2014-02-06: qty 2

## 2014-02-06 MED ORDER — ONDANSETRON 4 MG PO TBDP
8.0000 mg | ORAL_TABLET | Freq: Once | ORAL | Status: AC
  Administered 2014-02-06: 8 mg via ORAL
  Filled 2014-02-06: qty 2

## 2014-02-06 MED ORDER — SODIUM CHLORIDE 0.9 % IV BOLUS (SEPSIS)
1000.0000 mL | Freq: Once | INTRAVENOUS | Status: AC
  Administered 2014-02-06: 1000 mL via INTRAVENOUS

## 2014-02-06 NOTE — Discharge Instructions (Signed)
Abdominal Pain, Women °Abdominal (stomach, pelvic, or belly) pain can be caused by many things. It is important to tell your doctor: °· The location of the pain. °· Does it come and go or is it present all the time? °· Are there things that start the pain (eating certain foods, exercise)? °· Are there other symptoms associated with the pain (fever, nausea, vomiting, diarrhea)? °All of this is helpful to know when trying to find the cause of the pain. °CAUSES  °· Stomach: virus or bacteria infection, or ulcer. °· Intestine: appendicitis (inflamed appendix), regional ileitis (Crohn's disease), ulcerative colitis (inflamed colon), irritable bowel syndrome, diverticulitis (inflamed diverticulum of the colon), or cancer of the stomach or intestine. °· Gallbladder disease or stones in the gallbladder. °· Kidney disease, kidney stones, or infection. °· Pancreas infection or cancer. °· Fibromyalgia (pain disorder). °· Diseases of the female organs: °¨ Uterus: fibroid (non-cancerous) tumors or infection. °¨ Fallopian tubes: infection or tubal pregnancy. °¨ Ovary: cysts or tumors. °¨ Pelvic adhesions (scar tissue). °¨ Endometriosis (uterus lining tissue growing in the pelvis and on the pelvic organs). °¨ Pelvic congestion syndrome (female organs filling up with blood just before the menstrual period). °¨ Pain with the menstrual period. °¨ Pain with ovulation (producing an egg). °¨ Pain with an IUD (intrauterine device, birth control) in the uterus. °¨ Cancer of the female organs. °· Functional pain (pain not caused by a disease, may improve without treatment). °· Psychological pain. °· Depression. °DIAGNOSIS  °Your doctor will decide the seriousness of your pain by doing an examination. °· Blood tests. °· X-rays. °· Ultrasound. °· CT scan (computed tomography, special type of X-ray). °· MRI (magnetic resonance imaging). °· Cultures, for infection. °· Barium enema (dye inserted in the large intestine, to better view it with  X-rays). °· Colonoscopy (looking in intestine with a lighted tube). °· Laparoscopy (minor surgery, looking in abdomen with a lighted tube). °· Major abdominal exploratory surgery (looking in abdomen with a large incision). °TREATMENT  °The treatment will depend on the cause of the pain.  °· Many cases can be observed and treated at home. °· Over-the-counter medicines recommended by your caregiver. °· Prescription medicine. °· Antibiotics, for infection. °· Birth control pills, for painful periods or for ovulation pain. °· Hormone treatment, for endometriosis. °· Nerve blocking injections. °· Physical therapy. °· Antidepressants. °· Counseling with a psychologist or psychiatrist. °· Minor or major surgery. °HOME CARE INSTRUCTIONS  °· Do not take laxatives, unless directed by your caregiver. °· Take over-the-counter pain medicine only if ordered by your caregiver. Do not take aspirin because it can cause an upset stomach or bleeding. °· Try a clear liquid diet (broth or water) as ordered by your caregiver. Slowly move to a bland diet, as tolerated, if the pain is related to the stomach or intestine. °· Have a thermometer and take your temperature several times a day, and record it. °· Bed rest and sleep, if it helps the pain. °· Avoid sexual intercourse, if it causes pain. °· Avoid stressful situations. °· Keep your follow-up appointments and tests, as your caregiver orders. °· If the pain does not go away with medicine or surgery, you may try: °¨ Acupuncture. °¨ Relaxation exercises (yoga, meditation). °¨ Group therapy. °¨ Counseling. °SEEK MEDICAL CARE IF:  °· You notice certain foods cause stomach pain. °· Your home care treatment is not helping your pain. °· You need stronger pain medicine. °· You want your IUD removed. °· You feel faint or   lightheaded. °· You develop nausea and vomiting. °· You develop a rash. °· You are having side effects or an allergy to your medicine. °SEEK IMMEDIATE MEDICAL CARE IF:  °· Your  pain does not go away or gets worse. °· You have a fever. °· Your pain is felt only in portions of the abdomen. The right side could possibly be appendicitis. The left lower portion of the abdomen could be colitis or diverticulitis. °· You are passing blood in your stools (bright red or black tarry stools, with or without vomiting). °· You have blood in your urine. °· You develop chills, with or without a fever. °· You pass out. °MAKE SURE YOU:  °· Understand these instructions. °· Will watch your condition. °· Will get help right away if you are not doing well or get worse. °Document Released: 03/05/2007 Document Revised: 09/22/2013 Document Reviewed: 03/25/2009 °ExitCare® Patient Information ©2015 [employer]. This information is not intended to replace advice given to you by your health care provider. Make sure you discuss any questions you have with your health care provider. ° °Emergency Department Resource Guide °1) Find a Doctor and Pay Out of Pocket °Although you won't have to find out who is covered by your insurance plan, it is a good idea to ask around and get recommendations. You will then need to call the office and see if the doctor you have chosen will accept you as a new patient and what types of options they offer for patients who are self-pay. Some doctors offer discounts or will set up payment plans for their patients who do not have insurance, but you will need to ask so you aren't surprised when you get to your appointment. ° °2) Contact Your Local Health Department °Not all health departments have doctors that can see patients for sick visits, but many do, so it is worth a call to see if yours does. If you don't know where your local health department is, you can check in your phone book. The CDC also has a tool to help you locate your state's health department, and many state websites also have listings of all of their local health departments. ° °3) Find a Walk-in Clinic °If your illness is  not likely to be very severe or complicated, you may want to try a walk in clinic. These are popping up all over the country in pharmacies, drugstores, and shopping centers. They're usually staffed by nurse practitioners or physician assistants that have been trained to treat common illnesses and complaints. They're usually fairly quick and inexpensive. However, if you have serious medical issues or chronic medical problems, these are probably not your best option. ° °No Primary Care Doctor: °- Call [facility] at  832-8000 - they can help you locate a primary care doctor that  accepts your insurance, provides certain services, etc. °- Physician Referral Service- 1-800-533-3463 ° °Chronic Pain Problems: °Organization         Address  Phone   Notes  °[facility]  (336) 297-2271 Patients need to be referred by their primary care doctor.  ° °Medication Assistance: °Organization         Address  Phone   Notes  °[place] [facility] [address] °[place], [place] [address] (336) 641-8030 --Must be a resident of [place] °-- Must have NO insurance coverage whatsoever (no Medicaid/ Medicare, etc.) °-- The pt. MUST have a primary care doctor that directs their care regularly   and follows them in the community °  °[facility]  (866) 331-1348   °[employer]  (888) 892-1162   ° °Agencies that provide inexpensive medical care: °Organization         Address  Phone   Notes  °[facility]  (336) 832-8035   °[facility]    (336) 832-7272   °[facility] [address] °[place], [place] [address] (336) 832-4777   °[facility] [address], °[place] (336) 271-4999   °[facility]    (336) 373-0678   °[facility]    (336) 272-1050   °[facility] ° [address], [place] Phone:  (336) 832-4444, Fax:  (336) 832-4440 Hours of Operation:  9 am - 6  pm, M-F.  Also accepts Medicaid/Medicare and self-pay.  °[facility] ° [address], [place] Phone: (336) 832-3150, Fax: (336) 832-3151. Hours of Operation:  8:30 am - 5:30 pm, M-F.  Also accepts Medicaid and self-pay.  °[facility] [place] [address], [place] Phone: (336) 878-6027   °[facility] [address], [place], [place] (336)723-1848, Ext. 123 Mondays & Thursdays: 7-9 AM.  First 15 patients are seen on a first come, first serve basis. °  ° °Medicaid-accepting [place] Providers: ° °Organization         Address  Phone   Notes  °[facility] [address], [place] (336) 641-2100 Also accepts self-pay patients.  °[facility] [address], [place] ° (336) 856-9996   °[facility] [address], [place] (336) 288-8857   °[facility] [address] [place] [address], [place] (336) 299-7000   °Veita Bland [address], [place]  ° (336) 373-1557 Only accepts [place] Access Medicaid patients after they have their name applied to their card.  ° °Self-Pay (no insurance) in [place]: ° °Organization         Address  Phone   Notes  °Sickle Cell Patients, [facility] [address], [place] (336) 832-1970   °[facility] [address], [place] (336) 832-4400   °[facility] ° [address], [place] (336) 992-4800   °[facility]/Dr. Osei-Bonsu ° [address] [place] [address], [place] or [address], [place] (336) 841-8500 Phone number for both [place] and [place] locations is the same.  °[facility] [address], [place] (336) 299-0000   °[facility] [address] [place] [address], [place] or [address] (336) 852-7530 °(336) 878-2260   °[facility] [address], [place] (336) 350-1642, phone; (336) 294-5005, fax Sees patients 1st and 3rd Saturday of every month.  Must not qualify for public or private insurance (i.e. Medicaid, Medicare, [place] Health Choice, Veterans' Benefits) • Household income should be no more than 200% of the poverty level •The clinic cannot treat you if you are pregnant or think you are pregnant • Sexually transmitted diseases are not treated at the clinic.  ° ° °Dental Care: °Organization         Address  Phone  Notes  °[facility] [address],  (336) 641-6152 Accepts children up to age 21 who   are enrolled in Medicaid or [place] Health Choice; pregnant women with a Medicaid card; and children who have applied for Medicaid or [place] Health Choice, but were declined, whose parents can pay a reduced fee at time of service.  °[facility] [place]  [address], [place] (336) 641-7733 Accepts children up to age 21 who are enrolled in Medicaid or [place] Health Choice; pregnant women with a Medicaid card; and children who have applied for Medicaid or [place] Health Choice, but were declined, whose parents can pay a reduced fee at time of service.  °[facility] ° [address], [place] (336) 641-4533 Patients are seen by appointment only. Walk-ins are not accepted. [facility] will see patients 18 years of age and older. °Monday - Tuesday (8am-5pm) °Most Wednesdays (8:30-5pm) °$30 per visit, cash only  °[facility] ° [address], [place] (336) 641-4533 Patients are seen by appointment only. Walk-ins are not accepted. [facility] will see patients 18 years of age and older. °One Wednesday Evening (Monthly: Volunteer Based).  $30 per visit, cash only  °[facility]  (919) 537-3737 for adults; Children under age 4, call Graduate Pediatric Dentistry at ([phone])  [phone]. Children aged 4-14, please call (919) [phone] to request a pediatric application. ° Dental services are provided in all areas of dental care including fillings, crowns and bridges, complete and partial dentures, implants, gum treatment, root canals, and extractions. Preventive care is also provided. Treatment is provided to both adults and children. °Patients are selected via a lottery and there is often a waiting list. °  °[facility] [address], °[place] ° (336) 763-8833 [email or web address] °  °[facility] [address], [place], [place] (336)723-1848, Ext. 123 Second and Fourth Thursday of each month, opens at 6:30 AM; Clinic ends at 9 AM.  Patients are seen on a first-come first-served basis, and a limited number are seen during each clinic.  ° °[facility] ° [address], [place], [place] (336) 723-7904   Eligibility Requirements °You must have lived in [place], [place], or [place] counties for at least the last three months. °  You cannot be eligible for state or federal sponsored healthcare insurance, including Veterans Administration, Medicaid, or Medicare. °  You generally cannot be eligible for healthcare insurance through your employer.  °  How to apply: °Eligibility screenings are held every Tuesday and Wednesday afternoon from 1:00 pm until 4:00 pm. You do not need an appointment for the interview!  °[facility] [address], [place], [place] 336-631-2330   °[facility]  336-342-8273   °[facility]  336-703-3100   °[facility]  336-570-6415   ° °Behavioral Health Resources in the Community: °Intensive Outpatient Programs °Organization         Address  Phone  Notes  °[facility] [address], [place], [place] 336-878-6098   ° [facility] [address], [place], [place] 336-832-9800   °[facility]  [address], [place], [place] ° 336-882-2125   °[facility] [address],  °[place], [place] 1-800-853-5163 or 336-641-4981   °Substance Abuse Resources °Organization         Address  Phone  Notes  °[facility]  336-882-2125   °[facility]  336-784-9470   °  [facility]  336-285-9073   °[facility]  336-845-3988   °Residential & Outpatient Substance Abuse Program  1-800-659-3381   °Psychological Services °Organization         Address  Phone  Notes  °[facility]  336- 832-9600   °[facility]  336- 378-7881   °[facility] [address], [place] 1-800-853-5163 or 336-641-4981   ° °Mobile Crisis Teams °Organization         Address  Phone  Notes  °[facility], Mobile Crisis Care Unit  1-877-626-1772   °Assertive °[facility] ° [address] [place], [place] 336-834-9664   °Sharon DeEsch [address] °[place] 336-554-5454   ° °Self-Help/Support Groups °Organization         Address  Phone             Notes  °[facility] - variety of support groups  336- 373-1402 Call for more information  °Narcotics Anonymous (NA), [facility] [address], °[place]  2 meetings at this location  ° °Residential Treatment Programs °Organization         Address  Phone  Notes  °[facility] [address],    °[place]  1-866-801-8205   °[facility] ° [address], [place], [place] 704-293-8524   °[facility] [address], [place] 336-845-3988 Admissions: 8am-3pm M-F  °[facility] [address]    °[place], [place] 336-841-1104   °[facility] [address], [place], [place] 336-379-7146   °[facility] [address]  °[place], [place] 336-285-9073   °[facility] - Intensive Outpatient [address], [place], [place]  336-852-3033   °[facility] [address]  °[place], [place] 1-877-615-2722 or 336-784-9470   °[facility] [address] [place], [place] 336-227-7417 Accepts Medicaid  °[facility] [address]  °[place] 1-800-659-3381 Substance Abuse/Addiction Treatment  ° °[place] Behavioral Health Resources °Organization         Address  Phone  Notes  °[facility]  (888) 581-9988   °Julie Brannon, PhD [address] [place], [place]   (336) 349-5553 or (336) 951-0000   ° Behavioral   [address] °[place], [place] (336) 349-4454   °[facility] [address], [place], [place] (336) 342-8316 Insurance/Medicaid/sponsorship through [facility]  °[facility] [address]                                    [place], [place] (336) 342-8316 Therapy/tele-psych/case  °[facility] [address]  ° [place], [place] (336) 349-2233    °Dr. Arfeen  (336) 349-4544   °[facility] [place]  [employer] [place] [facility] 1) [address],  °2) [address], [place] °3)  [address], [place] (336) 349-3220 °(336) 342-7768 ° °(336) 342-8140   °[place] [facility] (336) 342-1394 or (336) 342-3537 (After Hours)    ° ° °

## 2014-02-06 NOTE — ED Provider Notes (Signed)
CSN: [id number]     Arrival date & time 02/06/14  0454 History   None    Chief Complaint  Patient presents with  . Nausea  . Abdominal Pain     (Consider location/radiation/quality/duration/timing/severity/associated sxs/prior Treatment) HPI Comments: Patient reports suprapubic abdominal pain, beginning at 11 PM associated with nausea and vomiting.  She denies any fevers, chills, diarrhea, dysuria or vaginal symptoms.  She reports remote history of STD approximately 5 years ago but has been sexually active with one partner since.  She has 3 current living children and has had 2 previous C-sections.  She denies any medical problems, and does not take any current medications.  No personal or family history of kidney stones.  She currently has Nexplanon in for one year for birth control.    Patient is a 28 y.o. female presenting with abdominal pain. The history is provided by the patient.  Abdominal Pain Pain location:  Suprapubic Pain quality: aching   Pain radiates to:  Groin Pain severity:  Moderate Onset quality:  Sudden Duration:  8 hours Timing:  Intermittent Progression:  Waxing and waning Chronicity:  New Context: previous surgery   Context: not alcohol use, not recent illness and not recent sexual activity   Context comment:  2 c-sections Relieved by:  None tried Worsened by:  Nothing tried Ineffective treatments:  None tried Associated symptoms: nausea and vomiting   Associated symptoms: no anorexia, no chest pain, no chills, no cough, no diarrhea, no dysuria, no fever, no hematemesis, no hematochezia, no hematuria, no shortness of breath, no vaginal bleeding and no vaginal discharge     Past Medical History  Diagnosis Date  . Anemia   . No pertinent past medical history   . Anginal pain     within the past week Dr. Clearance Coots heart burn  . GURKYHCW(237.6)    Past Surgical History  Procedure Laterality Date  . Cesarean section    . Cesarean section  05/27/2012     Procedure: CESAREAN SECTION;  Surgeon: Antionette Char, MD;  Location: [facility];  Service: Obstetrics;  Laterality: N/A;  Repeat cesarean section with delivery of baby boy at 90.    Family History  Problem Relation Age of Onset  . Other Neg Hx   . Cancer Father    History  Substance Use Topics  . Smoking status: Never Smoker   . Smokeless tobacco: Never Used  . Alcohol Use: No   OB History   Grav Para Term Preterm Abortions TAB SAB Ect Mult Living   Review of Systems  Constitutional: Negative for fever, chills and appetite change.  Eyes: Negative for visual disturbance.  Respiratory: Negative for cough and shortness of breath.   Cardiovascular: Negative for chest pain.  Gastrointestinal: Positive for nausea, vomiting and abdominal pain. Negative for diarrhea, blood in stool, hematochezia, anal bleeding, anorexia and hematemesis.  Genitourinary: Positive for pelvic pain. Negative for dysuria, urgency, hematuria, flank pain, vaginal bleeding, vaginal discharge, difficulty urinating and vaginal pain.  Musculoskeletal: Negative for arthralgias.  Skin: Negative for rash.      Allergies  Review of patient's allergies indicates no known allergies.  Home Medications   Prior to Admission medications   Medication Sig Start Date End Date Taking? Authorizing Provider  etonogestrel (NEXPLANON) 68 MG IMPL implant Inject 1 each into the skin once.   Yes Historical Provider, MD  ibuprofen (ADVIL,MOTRIN) 800 MG tablet Take 800  mg by mouth every 8 (eight) hours as needed for moderate pain.   Yes Historical Provider, MD   BP 105/72  Pulse 61  Temp(Src) 97.4 F (36.3 C) (Oral)  Resp 14  Ht  (1.575 m)  Wt 180 lb (81.647 kg)  BMI 32.91 kg/m2  SpO2 95%  LMP 01/17/2014  Breastfeeding? No Physical Exam  Vitals reviewed. Constitutional: She is oriented to person, place, and time. She appears well-developed and well-nourished. No distress.  HENT:   Mouth/Throat: Oropharynx is clear and moist.  Cardiovascular: Normal rate and regular rhythm.   Pulmonary/Chest: Effort normal.  Abdominal: Soft. Bowel sounds are normal. She exhibits no distension. There is no tenderness. There is no rebound and no guarding.  Genitourinary: Vagina normal. No vaginal discharge found.  Speculum Exam: Ext genitalia: wnl; Vaginal discharge: minimal; Cervix: normal Bimanual Exam: No Cervical motion tenderness; No Vaginal wall defects; Adnexa mildly tender L>R   Neurological: She is alert and oriented to person, place, and time.  Skin: Skin is warm. No rash noted.    ED Course  Procedures (including critical care time) Labs Review Labs Reviewed  COMPREHENSIVE METABOLIC PANEL - Abnormal; Notable for the following:    Potassium 3.6 (*)    Glucose, Bld 115 (*)    Alkaline Phosphatase 36 (*)    Anion gap 17 (*)    All other components within normal limits  WET PREP, GENITAL  GC/CHLAMYDIA PROBE AMP  CBC WITH DIFFERENTIAL  URINALYSIS, ROUTINE W REFLEX MICROSCOPIC  PREGNANCY, URINE    Imaging Review No results found.   EKG Interpretation None      MDM   Final diagnoses:  Lower abdominal pain   Patient presents with suprapubic abdominal pain, and vomiting that resolved while in the ED.  Abdominal exam benign; WBC and hemoglobin within normal limits.  UA negative for infection or pregnancy. Vaginal exam not concerning for PID.  Patient mainly concerned because father died from pancreatic cancer and wanted to know what tests she can have done to look for this; she denies any weight loss or night sweats.  Patient reassured and referral to gynecology for ongoing evaluation and suprapubic pain.   Jamal Collin, MD 02/06/14 [phone]

## 2014-02-06 NOTE — ED Notes (Signed)
Pt states that she started having nausea, vomiting and lower abd pain at 2300 last night. Pt is unsure whether she has had a fever or not.

## 2014-02-07 LAB — GC/CHLAMYDIA PROBE AMP
CT Probe RNA: NEGATIVE
GC PROBE AMP APTIMA: NEGATIVE

## 2014-02-11 NOTE — ED Provider Notes (Signed)
28 y.o. Female with suprapubic pain complaining.  Results for orders placed during the hospital encounter of 02/06/14  GC/CHLAMYDIA PROBE AMP      Result Value Ref Range   CT Probe RNA NEGATIVE  NEGATIVE   GC Probe RNA NEGATIVE  NEGATIVE  WET PREP, GENITAL      Result Value Ref Range   Yeast Wet Prep HPF POC NONE SEEN  NONE SEEN   Trich, Wet Prep NONE SEEN  NONE SEEN   Clue Cells Wet Prep HPF POC NONE SEEN  NONE SEEN   WBC, Wet Prep HPF POC NONE SEEN  NONE SEEN  CBC WITH DIFFERENTIAL      Result Value Ref Range   WBC 4.4  4.0 - 10.5 K/uL   RBC 4.73  3.87 - 5.11 MIL/uL   Hemoglobin 13.3  12.0 - 15.0 g/dL   HCT 16.1  09.6 - 04.5 %   MCV 80.3  78.0 - 100.0 fL   MCH 28.1  26.0 - 34.0 pg   MCHC 35.0  30.0 - 36.0 g/dL   RDW 40.9  81.1 - 91.4 %   Platelets 272  150 - 400 K/uL   Neutrophils Relative % 58  43 - 77 %   Neutro Abs 2.6  1.7 - 7.7 K/uL   Lymphocytes Relative 31  12 - 46 %   Lymphs Abs 1.4  0.7 - 4.0 K/uL   Monocytes Relative 9  3 - 12 %   Monocytes Absolute 0.4  0.1 - 1.0 K/uL   Eosinophils Relative 1  0 - 5 %   Eosinophils Absolute 0.0  0.0 - 0.7 K/uL   Basophils Relative 1  0 - 1 %   Basophils Absolute 0.0  0.0 - 0.1 K/uL  COMPREHENSIVE METABOLIC PANEL      Result Value Ref Range   Sodium 142  137 - 147 mEq/L   Potassium 3.6 (*) 3.7 - 5.3 mEq/L   Chloride 103  96 - 112 mEq/L   CO2 22  19 - 32 mEq/L   Glucose, Bld 115 (*) 70 - 99 mg/dL   BUN 8  6 - 23 mg/dL   Creatinine, Ser 7.82  0.50 - 1.10 mg/dL   Calcium 9.2  8.4 - 95.6 mg/dL   Total Protein 8.1  6.0 - 8.3 g/dL   Albumin 4.0  3.5 - 5.2 g/dL   AST 24  0 - 37 U/L   ALT 22  0 - 35 U/L   Alkaline Phosphatase 36 (*) 39 - 117 U/L   Total Bilirubin 0.9  0.3 - 1.2 mg/dL   GFR calc non Af Amer >90  >90 mL/min   GFR calc Af Amer >90  >90 mL/min   Anion gap 17 (*) 5 - 15  URINALYSIS, ROUTINE W REFLEX MICROSCOPIC      Result Value Ref Range   Color, Urine YELLOW  YELLOW   APPearance CLEAR  CLEAR   Specific  Gravity, Urine 1.018  1.005 - 1.030   pH 7.0  5.0 - 8.0   Glucose, UA NEGATIVE  NEGATIVE mg/dL   Hgb urine dipstick NEGATIVE  NEGATIVE   Bilirubin Urine NEGATIVE  NEGATIVE   Ketones, ur NEGATIVE  NEGATIVE mg/dL   Protein, ur NEGATIVE  NEGATIVE mg/dL   Urobilinogen, UA 1.0  0.0 - 1.0 mg/dL   Nitrite NEGATIVE  NEGATIVE   Leukocytes, UA NEGATIVE  NEGATIVE  PREGNANCY, URINE      Result Value Ref Range   Preg  Test, Ur NEGATIVE  NEGATIVE   wdwn female nad  Abdomen soft, no masses mild suprapubic ttp  Hilario Quarry, MD 02/11/14 1437

## 2014-03-23 ENCOUNTER — Encounter (HOSPITAL_COMMUNITY): Payer: Self-pay | Admitting: Emergency Medicine

## 2014-05-15 IMAGING — US US FETAL BPP W/O NONSTRESS
1 series · 13 of 13 positions shown · non-contrast
Comparison: none

CLINICAL DATA: Decreased fetal movement; pelvic pain.

BIOPHYSICAL PROFILE
Number of Fetuses: 1
Heart Rate: 135 bpm
Presentation: Cephalic
Movement: Yes
Vertical pocket:  5.6 cm
BPP:
Movement:  2 / 2        Time:  12 minutes
Breathing: 2 / 2
Tone:   2 / 2
Amniotic Fluid:  2 / 2
Total Score:  8 / 8

[Series 1: us fetal bpp w/o nonstress · non-contrast · 13 acquisitions, 13 frames shown]
[im 1/13]
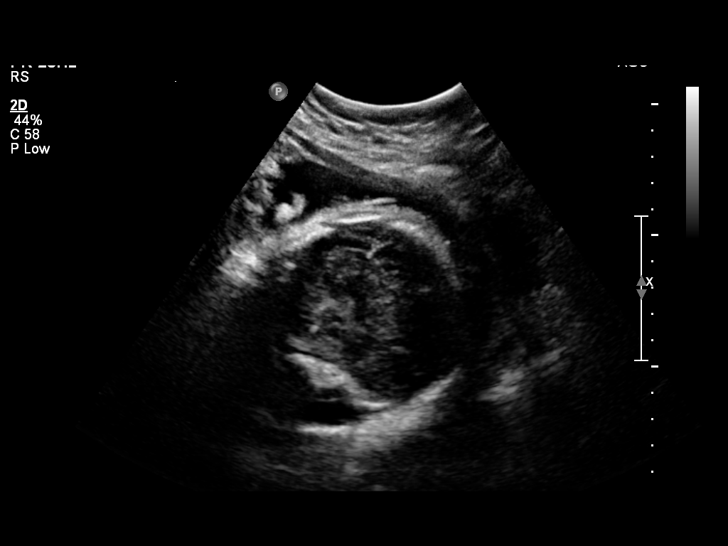
[im 2/13]
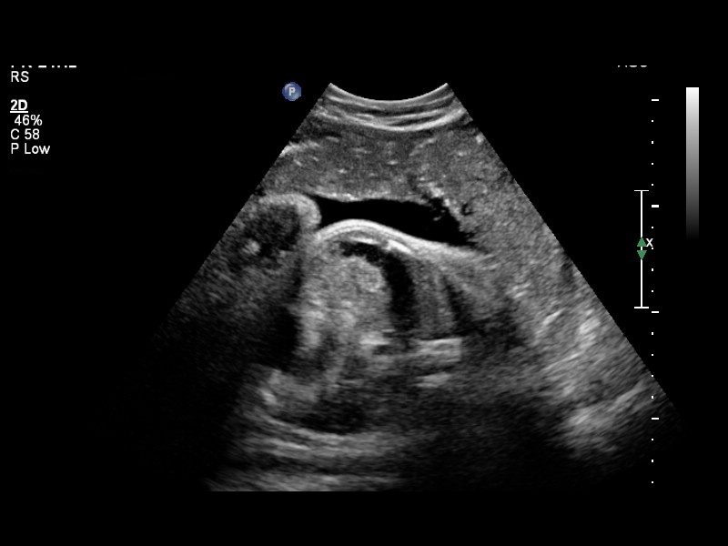
[im 3/13]
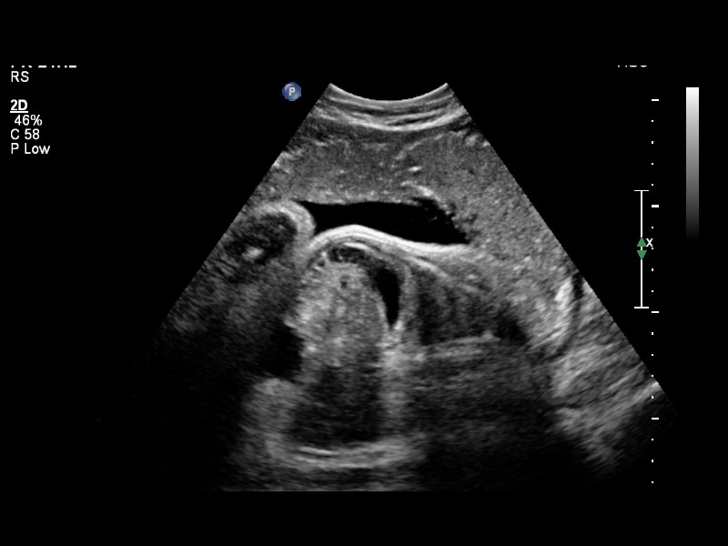
[im 4/13]
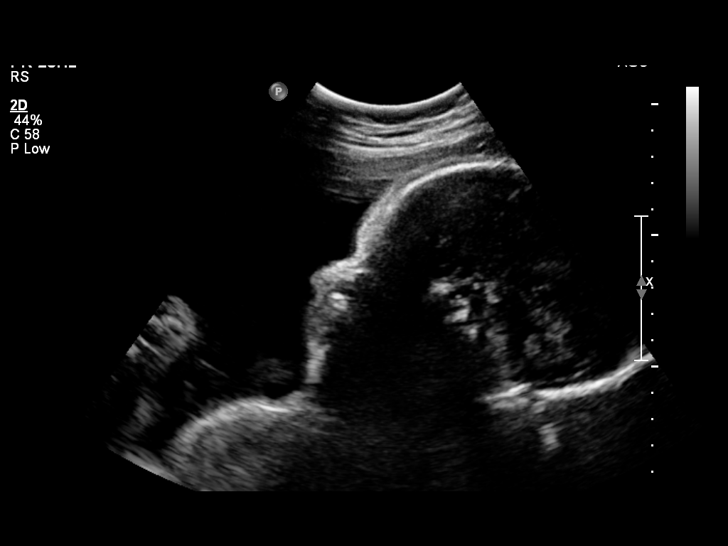
[im 5/13]
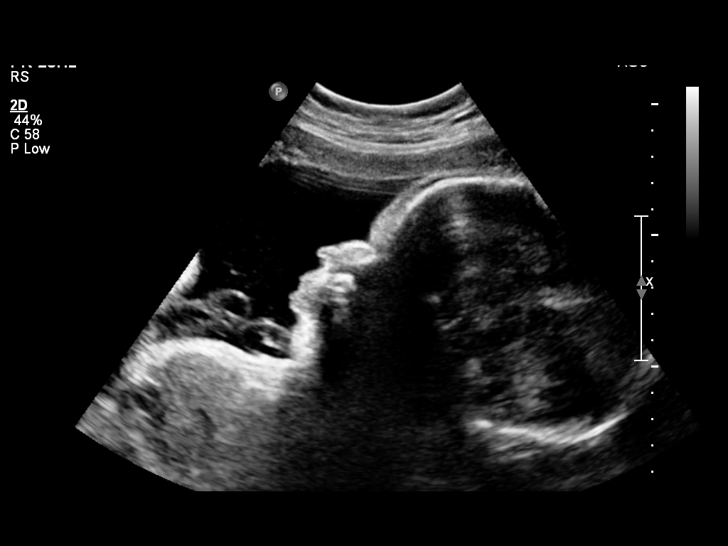
[im 6/13]
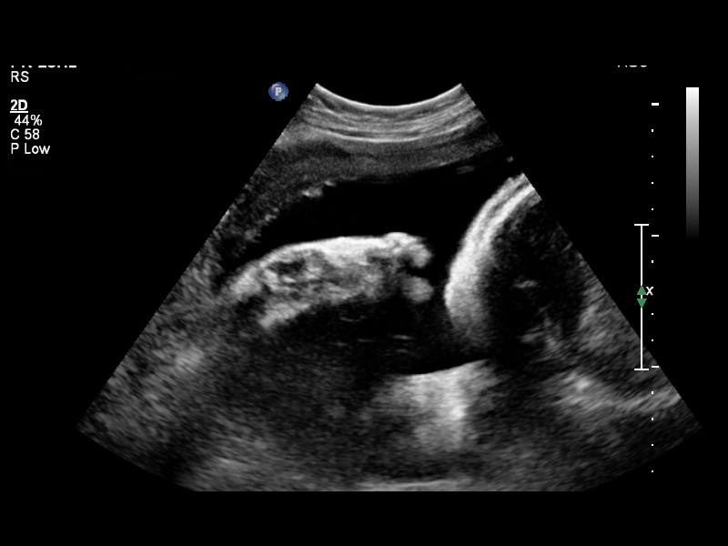
[im 7/13]
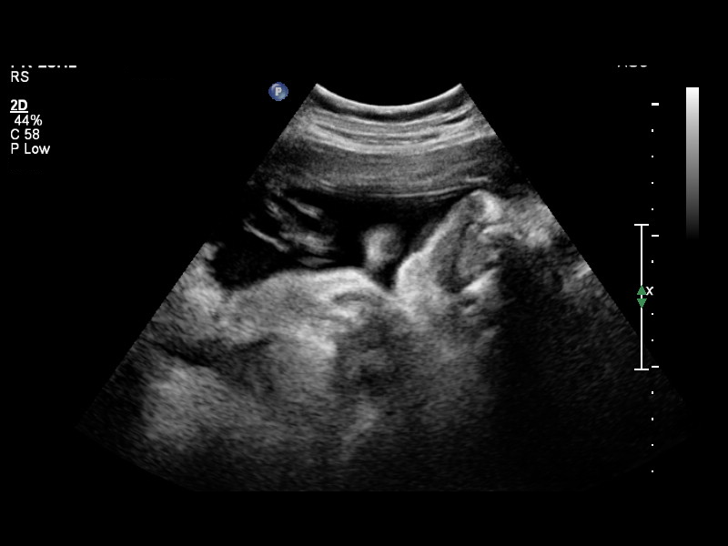
[im 8/13]
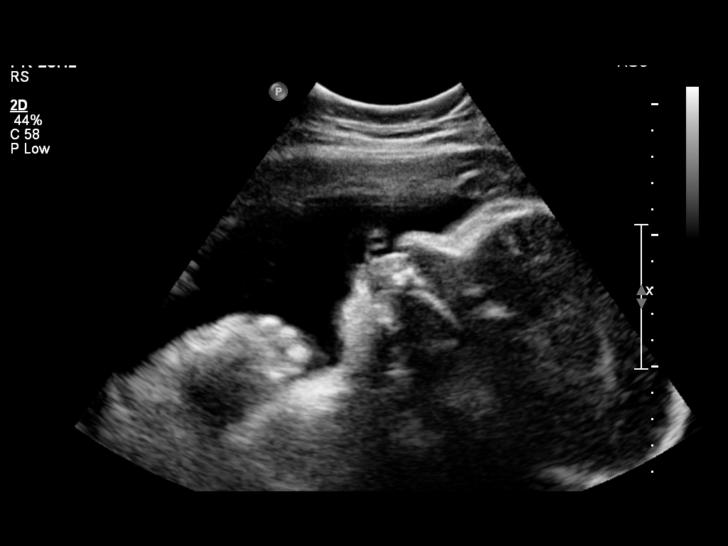
[im 9/13]
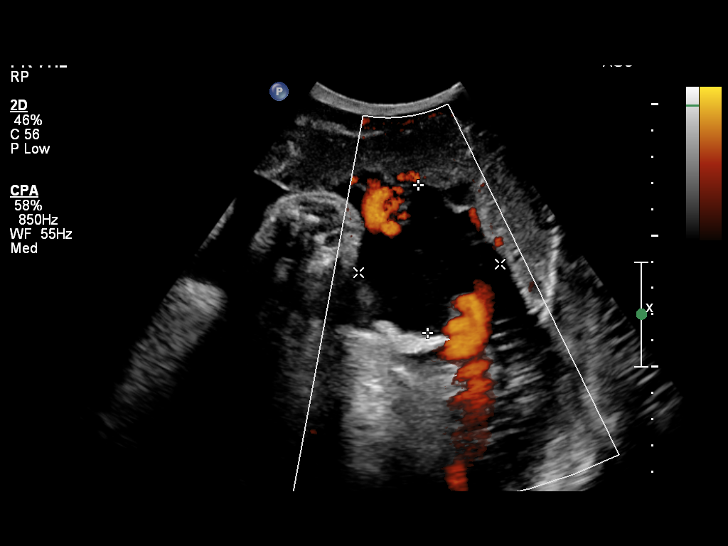
[im 10/13]
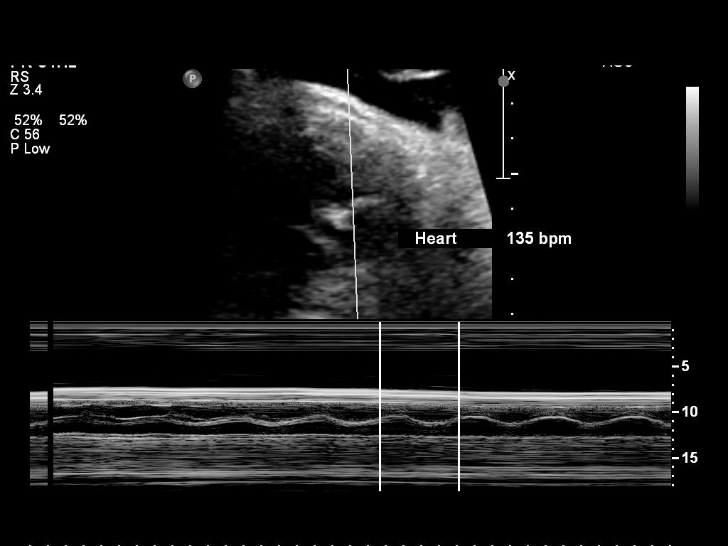
[im 11/13]
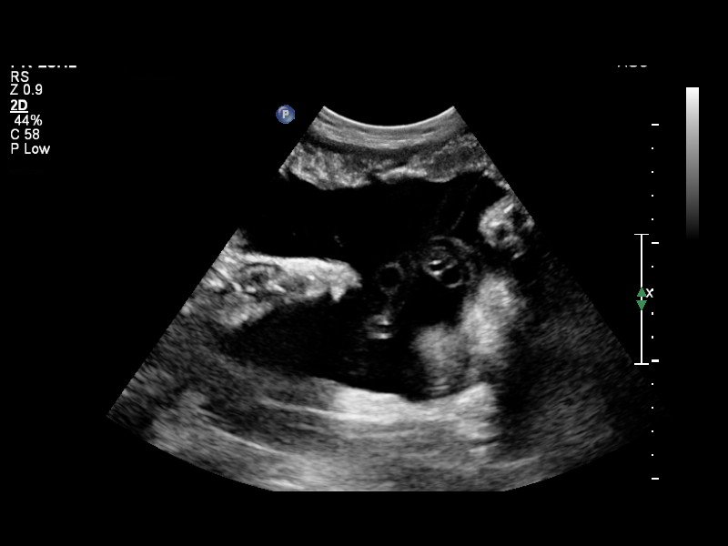
[im 12/13]
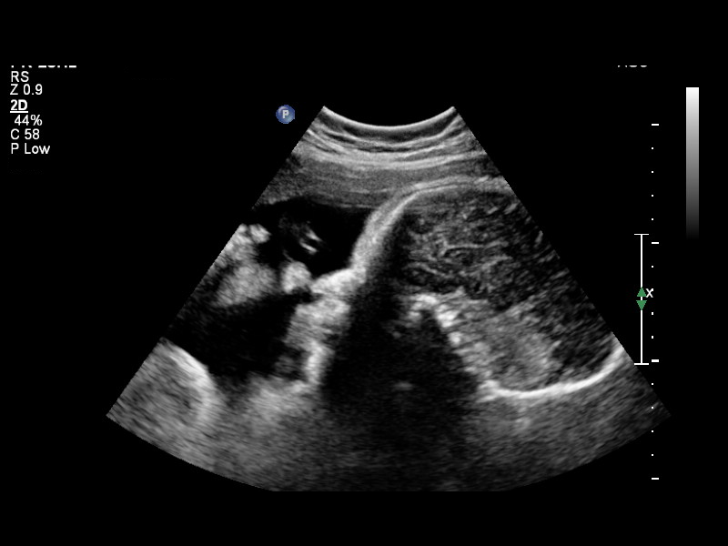
[im 13/13]
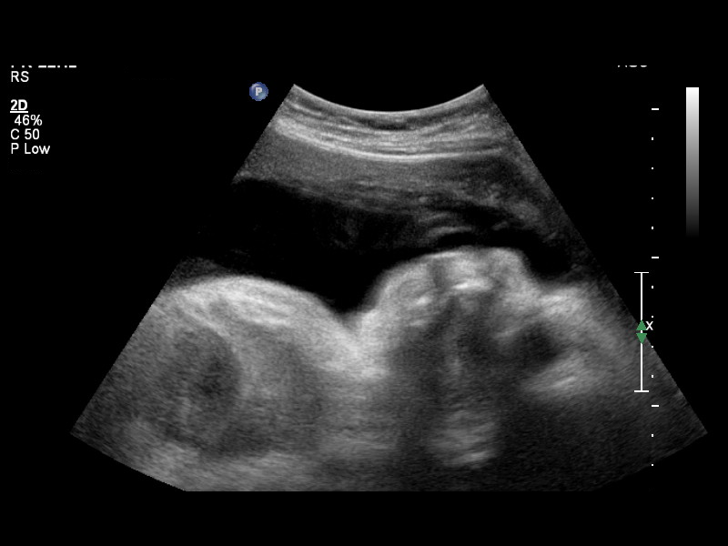

[13 of 13 positions shown; findings below may reference images not displayed]

IMPRESSION: Single live intrauterine pregnancy noted, in cephalic presentation.
Amniotic fluid levels appear grossly normal.  Biophysical profile
score of [DATE], at 12 minutes.

Recommend followup with non-emergent complete OB 14+ wk US
examination for fetal biometric evaluation and anatomic survey if
not already performed.

## 2014-07-20 ENCOUNTER — Emergency Department (HOSPITAL_COMMUNITY)
Admission: EM | Admit: 2014-07-20 | Discharge: 2014-07-20 | Disposition: A | Discharge status: Home / Self Care | Payer: Medicaid Other | Attending: Emergency Medicine | Admitting: Emergency Medicine

## 2014-07-20 ENCOUNTER — Encounter (HOSPITAL_COMMUNITY): Payer: Self-pay

## 2014-07-20 ENCOUNTER — Emergency Department (HOSPITAL_COMMUNITY): Payer: Medicaid Other

## 2014-07-20 DIAGNOSIS — R630 Anorexia: Secondary | ICD-10-CM | POA: Insufficient documentation

## 2014-07-20 DIAGNOSIS — Z862 Personal history of diseases of the blood and blood-forming organs and certain disorders involving the immune mechanism: Secondary | ICD-10-CM | POA: Insufficient documentation

## 2014-07-20 DIAGNOSIS — R079 Chest pain, unspecified: Secondary | ICD-10-CM | POA: Diagnosis present

## 2014-07-20 LAB — BASIC METABOLIC PANEL
Anion gap: 6 (ref 5–15)
BUN: 5 mg/dL — ABNORMAL LOW (ref 6–23)
CO2: 23 mmol/L (ref 19–32)
CREATININE: 0.95 mg/dL (ref 0.50–1.10)
Calcium: 8.8 mg/dL (ref 8.4–10.5)
Chloride: 107 mmol/L (ref 96–112)
GFR calc non Af Amer: 80 mL/min — ABNORMAL LOW (ref >90)
Glucose, Bld: 104 mg/dL — ABNORMAL HIGH (ref 70–99)
Potassium: 3.2 mmol/L — ABNORMAL LOW (ref 3.5–5.1)
Sodium: 136 mmol/L (ref 135–145)

## 2014-07-20 LAB — D-DIMER, QUANTITATIVE: D-Dimer, Quant: 0.49 ug/mL-FEU — ABNORMAL HIGH (ref 0.00–0.48)

## 2014-07-20 LAB — I-STAT TROPONIN, ED
TROPONIN I, POC: 0 ng/mL (ref 0.00–0.08)
Troponin i, poc: 0 ng/mL (ref 0.00–0.08)

## 2014-07-20 LAB — I-STAT BETA HCG BLOOD, ED (MC, WL, AP ONLY): I-stat hCG, quantitative: <5 m[IU]/mL (ref <5)

## 2014-07-20 LAB — CBC
HEMATOCRIT: 38.5 % (ref 36.0–46.0)
Hemoglobin: 13.3 g/dL (ref 12.0–15.0)
MCH: 29.1 pg (ref 26.0–34.0)
MCHC: 34.5 g/dL (ref 30.0–36.0)
MCV: 84.2 fL (ref 78.0–100.0)
Platelets: 265 10*3/uL (ref 150–400)
RBC: 4.57 MIL/uL (ref 3.87–5.11)
RDW: 13.7 % (ref 11.5–15.5)
WBC: 6.8 10*3/uL (ref 4.0–10.5)

## 2014-07-20 MED ORDER — ONDANSETRON HCL 4 MG/2ML IJ SOLN
4.0000 mg | Freq: Once | INTRAMUSCULAR | Status: AC
  Administered 2014-07-20: 4 mg via INTRAVENOUS
  Filled 2014-07-20: qty 2

## 2014-07-20 MED ORDER — IOHEXOL 350 MG/ML SOLN
80.0000 mL | Freq: Once | INTRAVENOUS | Status: AC | PRN
  Administered 2014-07-20: 80 mL via INTRAVENOUS

## 2014-07-20 MED ORDER — SODIUM CHLORIDE 0.9 % IV BOLUS (SEPSIS)
1000.0000 mL | Freq: Once | INTRAVENOUS | Status: AC
  Administered 2014-07-20: 1000 mL via INTRAVENOUS

## 2014-07-20 MED ORDER — ACETAMINOPHEN 325 MG PO TABS
650.0000 mg | ORAL_TABLET | Freq: Once | ORAL | Status: AC
Start: 2014-07-20 — End: 2014-07-20
  Administered 2014-07-20: 650 mg via ORAL
  Filled 2014-07-20: qty 2

## 2014-07-20 NOTE — Discharge Instructions (Signed)

## 2014-07-20 NOTE — ED Provider Notes (Signed)
CSN: [id number]     Arrival date & time 07/20/14  1245 History   First MD Initiated Contact with Patient 07/20/14 1412     Chief Complaint  Patient presents with  . Chest Pain     (Consider location/radiation/quality/duration/timing/severity/associated sxs/prior Treatment) HPI Comments: 29 year old female with anemia history presents with midsternal and right mid sharp chest pain worse with palpation movement and deep breath. Mild radiation of the right shoulder and nausea. No exertional symptoms, no current diaphoresis. Improved with massage and movement. Patient also has had nonspecific left lower calf and Achilles tenderness without injury. No blood clot history, no recent surgeries, no blood thinners, patient not currently pregnant. Patient does have Nexplanon. No cardiac history nonsmoker.  Patient is a 29 y.o. female presenting with chest pain. The history is provided by the patient.  Chest Pain Associated symptoms: no abdominal pain, no back pain, no fever, no headache, no shortness of breath and not vomiting     Past Medical History  Diagnosis Date  . Anemia   . No pertinent past medical history   . Anginal pain     within the past week Dr. Clearance CootsHarper heart burn  . GEXBMWUX(324.4Headache(784.0)    Past Surgical History  Procedure Laterality Date  . Cesarean section    . Cesarean section  05/27/2012    Procedure: CESAREAN SECTION;  Surgeon: Antionette CharLisa Jackson-Moore, MD;  Location: [facility];  Service: Obstetrics;  Laterality: N/A;  Repeat cesarean section with delivery of baby boy at [id number].    Family History  Problem Relation Age of Onset  . Other Neg Hx   . Cancer Father    History  Substance Use Topics  . Smoking status: Never Smoker   . Smokeless tobacco: Never Used  . Alcohol Use: No   OB History    Gravida Para Term Preterm AB TAB SAB Ectopic Multiple Living   6 3 3  3 3    3      Review of Systems  Constitutional: Positive for appetite change. Negative for fever and chills.  HENT:  Negative for congestion.   Eyes: Negative for visual disturbance.  Respiratory: Negative for shortness of breath.   Cardiovascular: Positive for chest pain.  Gastrointestinal: Negative for vomiting and abdominal pain.  Genitourinary: Negative for dysuria and flank pain.  Musculoskeletal: Negative for back pain, neck pain and neck stiffness.  Skin: Negative for rash.  Neurological: Negative for light-headedness and headaches.      Allergies  Review of patient's allergies indicates no known allergies.  Home Medications   Prior to Admission medications   Medication Sig Start Date End Date Taking? Authorizing Provider  etonogestrel (NEXPLANON) 68 MG IMPL implant Inject 1 each into the skin once.   Yes Historical Provider, MD  ibuprofen (ADVIL,MOTRIN) 800 MG tablet Take 800 mg by mouth every 8 (eight) hours as needed for moderate pain.   Yes Historical Provider, MD   BP 114/68 mmHg  Pulse 56  Temp(Src) 98.7 F (37.1 C) (Oral)  Resp 15  Ht 5\' 2"  (1.575 m)  Wt 180 lb (81.647 kg)  BMI 32.91 kg/m2  SpO2 100%  LMP 06/22/2014 Physical Exam  Constitutional: She is oriented to person, place, and time. She appears well-developed and well-nourished.  HENT:  Head: Normocephalic and atraumatic.  Eyes: Conjunctivae are normal. Right eye exhibits no discharge. Left eye exhibits no discharge.  Neck: Normal range of motion. Neck supple. No tracheal deviation present.  Cardiovascular: Normal rate, regular rhythm and intact distal pulses.  Pulmonary/Chest: Effort normal and breath sounds normal.  Abdominal: Soft. She exhibits no distension. There is no tenderness. There is no guarding.  Musculoskeletal: She exhibits tenderness (right medial pectoralis muscle parasternal to palpation). She exhibits no edema.  No leg swelling or calf tenderness on exam  Neurological: She is alert and oriented to person, place, and time. No cranial nerve deficit.  Skin: Skin is warm. No rash noted.  Psychiatric:  She has a normal mood and affect.  Nursing note and vitals reviewed.   ED Course  Procedures (including critical care time) Labs Review Labs Reviewed  BASIC METABOLIC PANEL - Abnormal; Notable for the following:    Potassium 3.2 (*)    Glucose, Bld 104 (*)    BUN 5 (*)    GFR calc non Af Amer 80 (*)    All other components within normal limits  D-DIMER, QUANTITATIVE - Abnormal; Notable for the following:    D-Dimer, Quant 0.49 (*)    All other components within normal limits  CBC  I-STAT TROPOININ, ED  I-STAT BETA HCG BLOOD, ED ([facility], [facility], [facility] ONLY)  I-STAT TROPOININ, ED    Imaging Review Dg Chest 2 View  07/20/2014   CLINICAL DATA:  Nausea with lightheadedness for 4 days. Left leg pain. Initial encounter.  EXAM: CHEST  2 VIEW  COMPARISON:  Radiographs 12/05/2012.  CT 12/05/2012.  FINDINGS: The heart size and mediastinal contours are normal. The lungs are clear. There is no pleural effusion or pneumothorax. No acute osseous findings are identified.  IMPRESSION: Stable examination.  No active cardiopulmonary process.   Electronically Signed   By: Carey Bullocks M.D.   On: 07/20/2014 14:13     EKG Interpretation   Date/Time:  Monday July 20 2014 12:46:55 EST Ventricular Rate:  88 PR Interval:  134 QRS Duration: 74 QT Interval:  314 QTC Calculation: 379 R Axis:   35 Text Interpretation:  Normal sinus rhythm with sinus arrhythmia  Nonspecific ST abnormality Abnormal ECG Confirmed by Kaelen Brennan  MD, Birl Lobello  ([id number]) on 07/20/2014 2:27:20 PM      MDM   Final diagnoses:  Chest pain   Well-appearing female with intermittent chest pain for 3-4 days. Patient low risk cardiac, low risk blood clot. D-dimer mildly positive. CT angina the chest pending. Patient improved on reassessment. Final disposition pending.  Results and differential diagnosis were discussed with the patient/parent/guardian. Close follow up outpatient was discussed, comfortable with the plan.   Medications   sodium chloride 0.9 % bolus 1,000 mL (not administered)  acetaminophen (TYLENOL) tablet 650 mg (650 mg Oral Given 07/20/14 1525)  ondansetron (ZOFRAN) injection 4 mg (4 mg Intravenous Given 07/20/14 1525)    Filed Vitals:   07/20/14 1250 07/20/14 1445 07/20/14 1500  BP: 128/85 107/69 114/68  Pulse: 75 56 56  Temp: 98.7 F (37.1 C)    TempSrc: Oral    Resp: Height:  (1.575 m)    Weight: 180 lb (81.647 kg)    SpO2: 100% 100% 100%    Final diagnoses:  Chest pain      Enid Skeens, MD 07/20/14 [phone]

## 2014-07-20 NOTE — ED Notes (Addendum)
C/o midsternal cp reproducible with palpation and movement that started last Thursday, radiates to right shoulder accompanied by nausea and diaphoresis at times, pain is constant and is alleviated by massage at times.  Patient is also c/o pain in her left leg that started slightly after the chest pain.  States she has chills at times.  Legs appear equal without swelling, warmth, or redness. Patient does have an estrogen implant

## 2014-07-20 NOTE — ED Provider Notes (Signed)
I assumed care at signout CT chest negative Pt walking around room in no distress We discussed strict return precautions   Joya Gaskinsonald W Carmell Elgin, MD 07/20/14 1714

## 2014-07-20 NOTE — ED Notes (Signed)
Pt reports chest and back pain since Thursday, sts it feels like all her bones in her chest hurt.

## 2014-07-20 NOTE — ED Notes (Addendum)
Patient returned to room. 

## 2014-07-27 ENCOUNTER — Telehealth: Payer: Self-pay | Admitting: *Deleted

## 2014-07-27 NOTE — Telephone Encounter (Signed)
Patient is interested in having her Nexplanon Removed.  Attempted to contact the patient and left message for patient to call the office.  

## 2014-07-28 NOTE — Telephone Encounter (Signed)
Patient contacted the office and has been scheduled for a Nexplanon Removal on August 19, 2014 @ 10:45 am.

## 2014-08-19 ENCOUNTER — Ambulatory Visit (INDEPENDENT_AMBULATORY_CARE_PROVIDER_SITE_OTHER): Payer: Medicaid Other | Admitting: Obstetrics

## 2014-08-19 VITALS — BP 100/70 | HR 56 | Wt 180.0 lb

## 2014-08-19 DIAGNOSIS — Z3049 Encounter for surveillance of other contraceptives: Secondary | ICD-10-CM | POA: Diagnosis not present

## 2014-08-19 DIAGNOSIS — Z3046 Encounter for surveillance of implantable subdermal contraceptive: Secondary | ICD-10-CM

## 2014-08-19 NOTE — Progress Notes (Signed)
NEXPLANON REMOVAL   Reasons  for removal:  Discontinuation of contraception   A timeout was performed confirming the patient, the procedure and allergy status. The patient's left  arm was palpated and the implant device located. The area was prepped with Betadinex3. The distal end of the device was palpated and 3 cc of 1% lidocaine was injected. A 2 mm incision was made. Any fibrotic tissue was carefully dissected away using blunt and/or sharp dissection. The device was removed in an intact manner.  Three sutures of 3-0 Vicryl used to close incision.  Steri-strips and a sterile dressing were applied to the incision. The patient tolerated the procedure well.  New contraceptive method: none

## 2014-08-20 ENCOUNTER — Encounter: Payer: Self-pay | Admitting: Obstetrics

## 2014-09-02 ENCOUNTER — Ambulatory Visit (INDEPENDENT_AMBULATORY_CARE_PROVIDER_SITE_OTHER): Payer: Medicaid Other | Admitting: Obstetrics

## 2014-09-02 VITALS — BP 108/77 | HR 68 | Temp 98.5°F | Wt 181.0 lb

## 2014-09-02 DIAGNOSIS — Z308 Encounter for other contraceptive management: Secondary | ICD-10-CM | POA: Diagnosis not present

## 2014-09-02 DIAGNOSIS — Z3046 Encounter for surveillance of implantable subdermal contraceptive: Secondary | ICD-10-CM

## 2014-09-02 NOTE — Progress Notes (Signed)
Patient ID: Angela [name], female   DOB: 04/22/1986, 29 y.o.   MRN: [id number]  Patient ID: Angela LashJenelle Mohr, female   DOB: 02/07/1986, 29 y.o.   MRN: [id number]  Chief Complaint  Patient presents with  . Follow-up    HPI Angela LashJenelle Mustin is a 29 y.o. female.  Presents for follow up after Nexplanon removal.  No complaints.  HPI  Past Medical History  Diagnosis Date  . Anemia   . No pertinent past medical history   . Anginal pain     within the past week Dr. Clearance CootsHarper heart burn  . Headache(784.0)   . Sickle cell anemia     Past Surgical History  Procedure Laterality Date  . Cesarean section    . Cesarean section  05/27/2012    Procedure: CESAREAN SECTION;  Surgeon: Antionette CharLisa Jackson-Moore, MD;  Location: [facility];  Service: Obstetrics;  Laterality: N/A;  Repeat cesarean section with delivery of baby boy at [id number].     Family History  Problem Relation Age of Onset  . Other Neg Hx   . Cancer Father     Social History History  Substance Use Topics  . Smoking status: Never Smoker   . Smokeless tobacco: Never Used  . Alcohol Use: No    No Known Allergies  Current Outpatient Prescriptions  Medication Sig Dispense Refill  . etonogestrel (NEXPLANON) 68 MG IMPL implant Inject 1 each into the skin once.    Marland Kitchen. ibuprofen (ADVIL,MOTRIN) 800 MG tablet Take 800 mg by mouth every 8 (eight) hours as needed for moderate pain.     No current facility-administered medications for this visit.    Review of Systems Review of Systems Constitutional: negative for fatigue and weight loss Respiratory: negative for cough and wheezing Cardiovascular: negative for chest pain, fatigue and palpitations Gastrointestinal: negative for abdominal pain and change in bowel habits Genitourinary:negative Integument/breast: negative for nipple discharge Musculoskeletal:negative for myalgias Neurological: negative for gait problems and tremors Behavioral/Psych: negative for abusive relationship,  depression Endocrine: negative for temperature intolerance     Blood pressure 108/77, pulse 68, temperature 98.5 F (36.9 C), weight 181 lb (82.101 kg).  Physical Exam Physical Exam:        Left arm:  Incision healed.  Stitches removed.                          Data Reviewed Labs  Assessment     Contraceptive Management.  Considering options.    Plan    Patient dispensed educational brochures. F/U prn  No orders of the defined types were placed in this encounter.   No orders of the defined types were placed in this encounter.

## 2014-09-03 ENCOUNTER — Encounter: Payer: Self-pay | Admitting: Obstetrics

## 2014-09-10 ENCOUNTER — Emergency Department (HOSPITAL_COMMUNITY)
Admission: EM | Admit: 2014-09-10 | Discharge: 2014-09-10 | Disposition: A | Discharge status: Home / Self Care | Payer: Medicaid Other | Attending: Emergency Medicine | Admitting: Emergency Medicine

## 2014-09-10 ENCOUNTER — Encounter (HOSPITAL_COMMUNITY): Payer: Self-pay | Admitting: Emergency Medicine

## 2014-09-10 DIAGNOSIS — R0981 Nasal congestion: Secondary | ICD-10-CM | POA: Insufficient documentation

## 2014-09-10 DIAGNOSIS — I209 Angina pectoris, unspecified: Secondary | ICD-10-CM | POA: Diagnosis not present

## 2014-09-10 DIAGNOSIS — H01003 Unspecified blepharitis right eye, unspecified eyelid: Secondary | ICD-10-CM | POA: Insufficient documentation

## 2014-09-10 DIAGNOSIS — R05 Cough: Secondary | ICD-10-CM | POA: Insufficient documentation

## 2014-09-10 DIAGNOSIS — H9201 Otalgia, right ear: Secondary | ICD-10-CM | POA: Diagnosis not present

## 2014-09-10 DIAGNOSIS — Z862 Personal history of diseases of the blood and blood-forming organs and certain disorders involving the immune mechanism: Secondary | ICD-10-CM | POA: Insufficient documentation

## 2014-09-10 MED ORDER — GUAIFENESIN 100 MG/5ML PO LIQD: 100.0000 mg | ORAL | Status: DC | PRN

## 2014-09-10 NOTE — Discharge Instructions (Signed)
Take guaifenesin for congestion.  Use warm compress and apply to your right eyelid several times daily to help with swelling.  Follow up with a primary care provider for further management of your health.   Emergency Department Resource Guide 1) Find a Doctor and Pay Out of Pocket Although you won't have to find out who is covered by your insurance plan, it is a good idea to ask around and get recommendations. You will then need to call the office and see if the doctor you have chosen will accept you as a new patient and what types of options they offer for patients who are self-pay. Some doctors offer discounts or will set up payment plans for their patients who do not have insurance, but you will need to ask so you aren't surprised when you get to your appointment.  2) Contact Your Local Health Department Not all health departments have doctors that can see patients for sick visits, but many do, so it is worth a call to see if yours does. If you don't know where your local health department is, you can check in your phone book. The CDC also has a tool to help you locate your state's health department, and many state websites also have listings of all of their local health departments.  3) Find a Walk-in Clinic If your illness is not likely to be very severe or complicated, you may want to try a walk in clinic. These are popping up all over the country in pharmacies, drugstores, and shopping centers. They're usually staffed by nurse practitioners or physician assistants that have been trained to treat common illnesses and complaints. They're usually fairly quick and inexpensive. However, if you have serious medical issues or chronic medical problems, these are probably not your best option.  No Primary Care Doctor: - Call Health Connect at  [phone] - they can help you locate a primary care doctor that  accepts your insurance, provides certain services, etc. - Physician Referral Service-  [phone]  Chronic Pain Problems: Organization         Address  Phone   Notes  [facility]  [phone] Patients need to be referred by their primary care doctor.   Medication Assistance: Organization         Address  Phone   Notes  [facility] [address] [place], [place] [address] [phone] --Must be a resident of [facility] [place] -- Must have NO insurance coverage whatsoever (no Medicaid/ Medicare, etc.) -- The pt. MUST have a primary care doctor that directs their care regularly and follows them in the community   MedAssist  [phone]   [employer]  [phone]    Agencies that provide inexpensive medical care: Organization         Address  Phone   Notes  [facility]  [phone]   [facility]    [phone]   [facility] [address] [place], [place] [address] [phone]   [facility] [address], [place] [phone]   [facility]anned [facility]    [phone]   [facility]    [phone]   [facility]  [address],  Phone:  [phone], Fax:  [phone] Hours of Operation:  9 am - 6 pm, M-F.  Also accepts Medicaid/Medicare and self-pay.  [facility]  [address], [place] Phone: [phone], Fax: [phone]. Hours of Operation:  8:30 am - 5:30 pm, M-F.  Also accepts Medicaid and self-pay.  [facility] [place] [address], [place] Phone: [phone]   [place], [place], [place] [phone], Ext. [phone] Mondays & Thursdays: 7-9 AM.  First 15 patients are seen on a first come, first serve basis.    [place] Providers:  Organization         Address  Phone   Notes  [facility] [address],  [place] [phone] Also accepts self-pay patients.  [facility] [address], [place]  [phone]   [place], [address], [place] [phone]   [facility] [address] [place] [phone]   Lucianne Lei [address], [place]   [phone] Only accepts [place] Access [place] patients after they have their name applied to their card.   Self-Pay (no insurance) in [facility]:  Organization         Address  Phone   Notes  Sickle Cell Patients, [facility] [place] [phone]   [facility] [place] [phone]   [facility] [place]  [place], [place], [place] [phone]   [facility]/Dr. Osei-Bonsu  [address], [place] or [address], [place] [phone] Phone number for both [place] and [place] locations is the same.  [facility] [address], [place] [phone]   [facility] [address] [place] or [address] [phone]   [facility] [address], [place] [phone], phone; [phone], fax Sees patients 1st and 3rd Saturday of every month.  Must not qualify for public or private insurance (i.e. Medicaid, Medicare, [place] Health Choice, Veterans' Benefits)  Household income should be no more than 200% of the poverty level The clinic cannot treat you if you are pregnant or think you are pregnant  Sexually transmitted diseases are not treated at the clinic.    Dental Care: Organization         Address  Phone  Notes  [facility] Department of [facility] [phone] Accepts children up to age 29 who are enrolled in  [place] or [place]; pregnant women with a Medicaid card; and children who have applied for Medicaid or [place] Health Choice, but were declined, whose parents can pay a reduced fee at time of service.  [facility] of [facility]  [address], [place] [phone] Accepts children up to age 36 who are enrolled in [place] or [place]; pregnant women with a Medicaid card; and children who have applied for Medicaid or [place] Health Choice, but were declined, whose parents can pay a reduced fee at time of service.  [facility] Access PRO[facility]  [place] [phone] Patients are seen by appointment only. Walk-ins are not accepted. [place] will see patients 66 years of age and older. Monday - Tuesday (8am-5pm) Most Wednesdays (8:30-5pm) $30 per visit, cash only  [place] [facility]  [address], [facility] [phone] Patients are seen by appointment only. Walk-ins are not accepted. [place] will see patients 55 years of age and older. One Wednesday Evening (Monthly: Volunteer Based).  $30 per visit, cash only  [place]  [phone] for adults; Children under age 16, call Graduate Pediatric Dentistry at [phone]. Children aged 15-14, please call [phone] to request a pediatric application.  Dental services are provided in all areas of dental care including fillings, crowns and bridges, complete and partial dentures, implants, gum treatment, root canals, and extractions. Preventive care is also provided. Treatment is provided to both adults and children. Patients are selected via a lottery and there is often a waiting list.   [facility] [address] [place]  [phone] [email or web address]   [facility] [address] [place], [place] [phone], Ext. [phone] Second and Fourth Thursday of each month, opens at 6:30  AM; Clinic ends at 9 AM.  Patients are seen on a first-come first-served basis, and a limited number are seen during each clinic.   [facility]  [address] Hillard Danker [place], [place] [phone]   Eligibility Requirements You must have lived in [place], [place], or [place] counties for at least the last three months.   You cannot be eligible for state or federal sponsored [employer], including [employer], [place], or [employer].   You generally cannot be eligible for healthcare insurance through your employer.    How to apply: Eligibility screenings are held every Tuesday and Wednesday afternoon from 1:00 pm until 4:00 pm. You do not need an appointment for the interview!  [facility] [address] [place], [place]   [employer]  [place] Department  [place]  [phone]    Behavioral Health Resources in the Community: Intensive Outpatient Programs Organization         Address  Phone  Notes  [place]. [address], [place], [place] [phone]   [facility] [address], [place], [employer]   ADS: Alcohol & Drug Svcs [address], [place], [place]   [place] [address]  [place], [place] or [phone]   Substance Abuse Resources Organization         Address  Phone  Notes  Alcohol and Drug Services  [phone]   [place]  [phone]   The [place]   Chinita Pester  [phone]   Residential & Outpatient Substance Abuse Program  [phone]   Psychological Services Organization         Address  Phone  Notes  [facility] [place]  [place]  [phone]   [place] [address] [place] [phone] or  [phone]    Mobile Crisis Teams Organization         Address  Phone  Notes  [facility], Mobile Crisis Care Unit  [phone]   [facility]  [address] [place], [place]   [place] [address] [place] [phone]    Self-Help/Support Groups Organization         Address  Phone             Notes  [facility] of [place] - variety of support groups  [place] Call for more information  Narcotics Anonymous (NA), [facility] [address], [employer] [place]  2 meetings at this location   Special educational needs teacher         Address  Phone  Notes  [facility] [place],    [place]  [phone]   [facility]  [address], [place] [address], [place], [place]   [place], [place] [phone] Admissions: 8am-3pm M-F  Incentives Substance [place] [address],    [place], [place] [phone]   [facility] [address] [place], [place], [place]   [facility] [address],  [place], [place]   [facility]ight [facility] - Intensive Outpatient [address], [place], [place]   [facility] ([place] [address].,  [place], [place] [phone] or [phone]   [facility] (RTS) [address] [place], [place] Accepts Medicaid  [facility] [place] [address]  [place] [phone] Substance Abuse/Addiction Treatment   [facility] Organization         Address  Phone  Notes  [facility]  [phone]   Domenic Schwab, PhD [address] Arlis Porta [place], [place]   [phone] or [phone]   [place], [place] [phone]   [facility] [address],  [place], [place] [phone] Insurance/Medicaid/sponsorship through [facility] and Families [address], [address]                                    [place], [place] [phone] [place] [address], [place] [phone]    Dr. Adele Schilder  [phone]   [facility] Dept. 1) [address] [place] 2) [place] 3)  [place] [address], [place] [phone]  [phone]   [place] [phone] or [phone] (After Hours)

## 2014-09-10 NOTE — ED Notes (Signed)
Patient states having R eyelid swelling that started x 2 days ago.  Patient complains of R ear pain, nasal congestion and cough.

## 2014-09-10 NOTE — ED Provider Notes (Signed)
CSN: [id number]     Arrival date & time 09/10/14  0756 History   First MD Initiated Contact with Patient 09/10/14 [phone]     Chief Complaint  Patient presents with  . Belepharitis  . Nasal Congestion  . Cough     (Consider location/radiation/quality/duration/timing/severity/associated sxs/prior Treatment) HPI   Patient reports since last night she has noticed some swelling to the lateral aspects of her right eyelid. She also complaining of tenderness to right ear, having nasal congestion, and cough productive with yellow sputum. She has tried using warm compress to the affected eyelid with some improvement. She denies any fever, chills, vision changes, pain with eye movement, pressure behind eyes, sneezing, sore throat, hearing loss, chest pain, shortness of breath, abdominal cramping, nausea vomiting diarrhea, or rash. No other specific treatment tried. She does not have a primary care provider.  Past Medical History  Diagnosis Date  . Anemia   . No pertinent past medical history   . Anginal pain     within the past week Dr. Clearance CootsHarper heart burn  . Headache(784.0)   . Sickle cell anemia    Past Surgical History  Procedure Laterality Date  . Cesarean section    . Cesarean section  05/27/2012    Procedure: CESAREAN SECTION;  Surgeon: Antionette CharLisa Jackson-Moore, MD;  Location: [facility];  Service: Obstetrics;  Laterality: N/A;  Repeat cesarean section with delivery of baby boy at [id number].    Family History  Problem Relation Age of Onset  . Other Neg Hx   . Cancer Father    History  Substance Use Topics  . Smoking status: Never Smoker   . Smokeless tobacco: Never Used  . Alcohol Use: No   OB History    Gravida Para Term Preterm AB TAB SAB Ectopic Multiple Living   6 3 3  3 3    3      Review of Systems  All other systems reviewed and are negative.     Allergies  Review of patient's allergies indicates no known allergies.  Home Medications   Prior to Admission medications    Medication Sig Start Date End Date Taking? Authorizing Provider  etonogestrel (NEXPLANON) 68 MG IMPL implant Inject 1 each into the skin once.    Historical Provider, MD  ibuprofen (ADVIL,MOTRIN) 800 MG tablet Take 800 mg by mouth every 8 (eight) hours as needed for moderate pain.    Historical Provider, MD   BP 111/64 mmHg  Pulse 76  Temp(Src) 98.8 F (37.1 C) (Oral)  Resp 18  SpO2 100% Physical Exam  Constitutional: She appears well-developed and well-nourished. No distress.  HENT:  Head: Atraumatic.  Right Ear: External ear normal.  Left Ear: External ear normal.  Mouth/Throat: Oropharynx is clear and moist.  Eyes: Conjunctivae are normal.  Neck: Normal range of motion. Neck supple.  Cardiovascular: Normal rate and regular rhythm.   Pulmonary/Chest: Effort normal and breath sounds normal. She has no wheezes.  Abdominal: Soft.  Neurological: She is alert.  Skin: No rash noted.  Psychiatric: She has a normal mood and affect.  Nursing note and vitals reviewed.   ED Course  Procedures (including critical care time)  Patient complaining of discomfort in her right eyelid. No evidence of stye or chalazion appreciated. A sharp movement intact and no eye involvement. Mild edema noted to lateral lacrimal gland. Recommend warm compress. She also has congestion and productive cough, likely postnasal drips. guaifenesin provided for congestion. Resource of provider for outpatient follow-up. I have  low suspicion for pneumonia or other bacterial infection.  Labs Review Labs Reviewed - No data to display  Imaging Review No results found.   EKG Interpretation None      MDM   Final diagnoses:  Nasal congestion    BP 111/64 mmHg  Pulse 76  Temp(Src) 98.8 F (37.1 C) (Oral)  Resp 18  SpO2 100%     Fayrene Helper, PA-C 09/10/14 1610  Purvis Sheffield, MD 09/10/14 2047

## 2014-12-03 ENCOUNTER — Encounter: Payer: Self-pay | Admitting: Obstetrics

## 2014-12-03 ENCOUNTER — Ambulatory Visit (INDEPENDENT_AMBULATORY_CARE_PROVIDER_SITE_OTHER): Payer: Medicaid Other | Admitting: Obstetrics

## 2014-12-03 VITALS — BP 102/72 | HR 62 | Temp 98.5°F | Ht 62.0 in | Wt 178.0 lb

## 2014-12-03 DIAGNOSIS — Z0000 Encounter for general adult medical examination without abnormal findings: Secondary | ICD-10-CM | POA: Diagnosis not present

## 2014-12-03 DIAGNOSIS — Z124 Encounter for screening for malignant neoplasm of cervix: Secondary | ICD-10-CM | POA: Diagnosis not present

## 2014-12-03 DIAGNOSIS — Z01419 Encounter for gynecological examination (general) (routine) without abnormal findings: Secondary | ICD-10-CM

## 2014-12-03 NOTE — Progress Notes (Signed)
Subjective:        Angela [name] is a 29 y.o. female here for a routine exam.  Current complaints: None.    Personal health questionnaire:  Is patient Ashkenazi Jewish, have a family history of breast and/or ovarian cancer: no Is there a family history of uterine cancer diagnosed at age < 12, gastrointestinal cancer, urinary tract cancer, family member who is a Personnel officer syndrome-associated carrier: no Is the patient overweight and hypertensive, family history of diabetes, personal history of gestational diabetes, preeclampsia or PCOS: no Is patient over 67, have PCOS,  family history of premature CHD under age 24, diabetes, smoke, have hypertension or peripheral artery disease:  no At any time, has a partner hit, kicked or otherwise hurt or frightened you?: no Over the past 2 weeks, have you felt down, depressed or hopeless?: no Over the past 2 weeks, have you felt little interest or pleasure in doing things?:no   Gynecologic History Patient's last menstrual period was 11/26/2014. Contraception: condoms Last Pap: 2014. Results were: normal Last mammogram: n/a. Results were: n/a  Obstetric History OB History  Gravida Para Term Preterm AB SAB TAB Ectopic Multiple Living  # Outcome Date GA Lbr Len/2nd Weight Sex Delivery Anes PTL Lv  6 Term 05/27/12 [redacted]w[redacted]d  7 lb 10.4 oz (3.47 kg) M CS-LTranv Spinal  Y  5 TAB           4 TAB           3 TAB           2 Term      CS-LTranv     1 Term      Vag-Spont         Past Medical History  Diagnosis Date  . Anemia   . No pertinent past medical history   . Anginal pain     within the past week Dr. Clearance Coots heart burn  . Headache(784.0)   . Sickle cell anemia     Past Surgical History  Procedure Laterality Date  . Cesarean section    . Cesarean section  05/27/2012    Procedure: CESAREAN SECTION;  Surgeon: Antionette Char, MD;  Location: [facility];  Service: Obstetrics;  Laterality: N/A;  Repeat cesarean section with  delivery of baby boy at 39.     No current outpatient prescriptions on file. No Known Allergies  History  Substance Use Topics  . Smoking status: Never Smoker   . Smokeless tobacco: Never Used  . Alcohol Use: No    Family History  Problem Relation Age of Onset  . Other Neg Hx   . Cancer Father       Review of Systems  Constitutional: negative for fatigue and weight loss Respiratory: negative for cough and wheezing Cardiovascular: negative for chest pain, fatigue and palpitations Gastrointestinal: negative for abdominal pain and change in bowel habits Musculoskeletal:negative for myalgias Neurological: negative for gait problems and tremors Behavioral/Psych: negative for abusive relationship, depression Endocrine: negative for temperature intolerance   Genitourinary:negative for abnormal menstrual periods, genital lesions, hot flashes, sexual problems and vaginal discharge Integument/breast: negative for breast lump, breast tenderness, nipple discharge and skin lesion(s)    Objective:       BP 102/72 mmHg  Pulse 62  Temp(Src) 98.5 F (36.9 C)  Ht  (1.575 m)  Wt 178 lb (80.74 kg)  BMI 32.55 kg/m2  LMP 11/26/2014 General:   alert  Skin:   no rash or abnormalities  Lungs:   clear to auscultation bilaterally  Heart:   regular rate and rhythm, S1, S2 normal, no murmur, click, rub or gallop  Breasts:   normal without suspicious masses, skin or nipple changes or axillary nodes  Abdomen:  normal findings: no organomegaly, soft, non-tender and no hernia  Pelvis:  External genitalia: normal general appearance Urinary system: urethral meatus normal and bladder without fullness, nontender Vaginal: normal without tenderness, induration or masses Cervix: normal appearance Adnexa: normal bimanual exam Uterus: anteverted and non-tender, normal size   Lab Review Urine pregnancy test Labs reviewed yes Radiologic studies reviewed no    Assessment:    Healthy female  exam.    Contraceptive management.  Wants to continue condoms.   Plan:    Education reviewed: calcium supplements, low fat, low cholesterol diet, self breast exams and weight bearing exercise. Contraception: condoms. Follow up in: 1 year.   No orders of the defined types were placed in this encounter.   No orders of the defined types were placed in this encounter.

## 2014-12-03 NOTE — Addendum Note (Signed)
Addended by: Marya LandryFOSTER, SUZANNE D on: 12/03/2014 04:48 PM   Modules accepted: Orders

## 2014-12-07 LAB — PAP IG AND HPV HIGH-RISK: HPV DNA HIGH RISK: NOT DETECTED

## 2014-12-07 LAB — SURESWAB, VAGINOSIS/VAGINITIS PLUS
ATOPOBIUM VAGINAE: 7.5 Log (cells/mL)
C. PARAPSILOSIS, DNA: NOT DETECTED
C. albicans, DNA: NOT DETECTED
C. glabrata, DNA: NOT DETECTED
C. trachomatis RNA, TMA: NOT DETECTED
C. tropicalis, DNA: NOT DETECTED
Gardnerella vaginalis: >8 Log (cells/mL)
LACTOBACILLUS SPECIES: NOT DETECTED Log (cells/mL)
MEGASPHAERA SPECIES: NOT DETECTED Log (cells/mL)
N. gonorrhoeae RNA, TMA: NOT DETECTED
T. VAGINALIS RNA, QL TMA: NOT DETECTED

## 2014-12-08 ENCOUNTER — Other Ambulatory Visit: Payer: Self-pay | Admitting: Obstetrics

## 2014-12-08 DIAGNOSIS — B9689 Other specified bacterial agents as the cause of diseases classified elsewhere: Secondary | ICD-10-CM

## 2014-12-08 DIAGNOSIS — N760 Acute vaginitis: Principal | ICD-10-CM

## 2014-12-08 MED ORDER — TINIDAZOLE 500 MG PO TABS: 1000.0000 mg | ORAL_TABLET | Freq: Every day | ORAL | Status: DC

## 2015-01-28 ENCOUNTER — Other Ambulatory Visit: Payer: Self-pay | Admitting: Obstetrics

## 2015-01-28 ENCOUNTER — Ambulatory Visit (INDEPENDENT_AMBULATORY_CARE_PROVIDER_SITE_OTHER): Payer: Medicaid Other

## 2015-01-28 ENCOUNTER — Encounter: Payer: Self-pay | Admitting: Obstetrics

## 2015-01-28 ENCOUNTER — Other Ambulatory Visit (INDEPENDENT_AMBULATORY_CARE_PROVIDER_SITE_OTHER): Payer: Medicaid Other

## 2015-01-28 VITALS — BP 103/64 | HR 87 | Temp 98.5°F | Ht 62.0 in | Wt 181.0 lb

## 2015-01-28 DIAGNOSIS — O9989 Other specified diseases and conditions complicating pregnancy, childbirth and the puerperium: Secondary | ICD-10-CM

## 2015-01-28 DIAGNOSIS — R102 Pelvic and perineal pain: Secondary | ICD-10-CM | POA: Diagnosis not present

## 2015-01-28 DIAGNOSIS — N926 Irregular menstruation, unspecified: Secondary | ICD-10-CM

## 2015-01-28 DIAGNOSIS — O26899 Other specified pregnancy related conditions, unspecified trimester: Secondary | ICD-10-CM

## 2015-01-28 LAB — POCT URINE PREGNANCY: PREG TEST UR: POSITIVE — AB

## 2015-01-28 NOTE — Progress Notes (Unsigned)
Patient in office today for a pregnancy test. Patient states she has not had a period since November 26, 2014. Pregnancy Test in office is positive. Patient states this is an unplanned pregnancy tat she does wish to continue. Patient encouraged to start prenatal vitamins and to schedule a NOB appointment.   BP 103/64 mmHg  Pulse 87  Temp(Src) 98.5 F (36.9 C)  Ht  (1.575 m)  Wt 181 lb (82.101 kg)  BMI 33.10 kg/m2  LMP 11/26/2014 (Exact Date)

## 2015-01-29 LAB — CULTURE, OB URINE
Colony Count: NO GROWTH
Organism ID, Bacteria: NO GROWTH

## 2015-02-09 ENCOUNTER — Other Ambulatory Visit: Payer: Self-pay | Admitting: *Deleted

## 2015-02-09 DIAGNOSIS — Z3481 Encounter for supervision of other normal pregnancy, first trimester: Secondary | ICD-10-CM

## 2015-02-09 MED ORDER — OB COMPLETE PETITE 35-5-1-200 MG PO CAPS: 1.0000 | ORAL_CAPSULE | Freq: Every day | ORAL | Status: DC

## 2015-02-23 ENCOUNTER — Encounter: Payer: Self-pay | Admitting: Certified Nurse Midwife

## 2015-02-23 ENCOUNTER — Ambulatory Visit (INDEPENDENT_AMBULATORY_CARE_PROVIDER_SITE_OTHER): Payer: Medicaid Other | Admitting: Certified Nurse Midwife

## 2015-02-23 VITALS — BP 100/67 | HR 84 | Wt 181.0 lb

## 2015-02-23 DIAGNOSIS — Z3481 Encounter for supervision of other normal pregnancy, first trimester: Secondary | ICD-10-CM

## 2015-02-23 DIAGNOSIS — Z3480 Encounter for supervision of other normal pregnancy, unspecified trimester: Secondary | ICD-10-CM | POA: Insufficient documentation

## 2015-02-23 DIAGNOSIS — G4452 New daily persistent headache (NDPH): Secondary | ICD-10-CM

## 2015-02-23 DIAGNOSIS — Z3482 Encounter for supervision of other normal pregnancy, second trimester: Secondary | ICD-10-CM | POA: Diagnosis not present

## 2015-02-23 DIAGNOSIS — O219 Vomiting of pregnancy, unspecified: Secondary | ICD-10-CM

## 2015-02-23 LAB — HIV ANTIBODY (ROUTINE TESTING W REFLEX): HIV 1&2 Ab, 4th Generation: NONREACTIVE

## 2015-02-23 LAB — POCT URINALYSIS DIPSTICK
Bilirubin, UA: NEGATIVE
Blood, UA: NEGATIVE
Glucose, UA: NEGATIVE
Ketones, UA: NEGATIVE
LEUKOCYTES UA: NEGATIVE
NITRITE UA: NEGATIVE
PH UA: 5
Protein, UA: NEGATIVE
Spec Grav, UA: 1.015
UROBILINOGEN UA: NEGATIVE

## 2015-02-23 LAB — TSH: TSH: 0.62 u[IU]/mL (ref 0.350–4.500)

## 2015-02-23 MED ORDER — BUTALBITAL-APAP-CAFFEINE 50-325-40 MG PO TABS: 1.0000 | ORAL_TABLET | Freq: Four times a day (QID) | ORAL | Status: DC | PRN

## 2015-02-23 MED ORDER — ONDANSETRON HCL 4 MG PO TABS: 4.0000 mg | ORAL_TABLET | Freq: Every day | ORAL | Status: DC | PRN

## 2015-02-23 NOTE — Progress Notes (Signed)
Subjective:    Angela [name] is being seen today for her first obstetrical visit.  This is not a planned pregnancy. She is at [redacted]w[redacted]d gestation. Her obstetrical history is significant for none. Relationship with FOB: fiance living together, [employer]. Patient does intend to breast feed. Pregnancy history fully reviewed.  The information documented in the HPI was reviewed and verified.  Menstrual History: OB History    Gravida Para Term Preterm AB TAB SAB Ectopic Multiple Living   0 3 3 0 0 0 3      Menarche age: 7 roughly  Patient's last menstrual period was 11/26/2014 (exact date).    Past Medical History  Diagnosis Date  . Anemia   . No pertinent past medical history   . Anginal pain (HCC)     within the past week Dr. Clearance Coots heart burn  . Headache(784.0)   . Sickle cell anemia [facility])     Past Surgical History  Procedure Laterality Date  . Cesarean section    . Cesarean section  05/27/2012    Procedure: CESAREAN SECTION;  Surgeon: Antionette Char, MD;  Location: [facility];  Service: Obstetrics;  Laterality: N/A;  Repeat cesarean section with delivery of baby boy at 26.      (Not in a hospital admission) No Known Allergies  Social History  Substance Use Topics  . Smoking status: Never Smoker   . Smokeless tobacco: Never Used  . Alcohol Use: No    Family History  Problem Relation Age of Onset  . Other Neg Hx   . Cancer Father      Review of Systems Constitutional: negative for weight loss Gastrointestinal: + for nausea & vomiting Genitourinary:negative for genital lesions and vaginal discharge and dysuria Musculoskeletal:negative for back pain Behavioral/Psych: negative for abusive relationship, depression, illegal drug usage and tobacco use    Objective:    BP 100/67 mmHg  Pulse 84  Wt 181 lb (82.101 kg)  LMP 11/26/2014 (Exact Date) General Appearance:    Alert, cooperative, no distress, appears stated age  Head:    Normocephalic, without obvious  abnormality, atraumatic  Eyes:    PERRL, conjunctiva/corneas clear, EOM's intact, fundi    benign, both eyes  Ears:    Normal TM's and external ear canals, both ears  Nose:   Nares normal, septum midline, mucosa normal, no drainage    or sinus tenderness  Throat:   Lips, mucosa, and tongue normal; teeth and gums normal  Neck:   Supple, symmetrical, trachea midline, no adenopathy;    thyroid:  no enlargement/tenderness/nodules; no carotid   bruit or JVD  Back:     Symmetric, no curvature, ROM normal, no CVA tenderness  Lungs:     Clear to auscultation bilaterally, respirations unlabored  Chest Wall:    No tenderness or deformity   Heart:    Regular rate and rhythm, S1 and S2 normal, no murmur, rub   or gallop  Breast Exam:    No tenderness, masses, or nipple abnormality  Abdomen:     Soft, non-tender, bowel sounds active all four quadrants,    no masses, no organomegaly  Genitalia:    Normal female without lesion, discharge or tenderness  Extremities:   Extremities normal, atraumatic, no cyanosis or edema  Pulses:   2+ and symmetric all extremities  Skin:   Skin color, texture, turgor normal, no rashes or lesions  Lymph nodes:   Cervical, supraclavicular, and axillary nodes normal  Neurologic:   CNII-XII intact,  normal strength, sensation and reflexes    throughout           Cervix:  Long, thick, closed and posterior     FHR:  158 by doppler  Lab Review Urine pregnancy test Labs reviewed yes Radiologic studies reviewed yes Assessment:    Pregnancy at [redacted]w[redacted]d weeks   N&V in early pregnancy  HA  Plan:   Prenatal vitamins.  Counseling provided regarding continued use of seat belts, cessation of alcohol consumption, smoking or use of illicit drugs; infection precautions i.e., influenza/TDAP immunizations, toxoplasmosis,CMV, parvovirus, listeria and varicella; workplace safety, exercise during pregnancy; routine dental care, safe medications, sexual activity, hot tubs, saunas, pools,  travel, caffeine use, fish and methlymercury, potential toxins, hair treatments, varicose veins Weight gain recommendations per IOM guidelines reviewed: underweight/BMI< 18.5--> gain 28 - 40 lbs; normal weight/BMI 18.5 - 24.9--> gain 25 - 35 lbs; overweight/BMI 25 - 29.9--> gain 15 - 25 lbs; obese/BMI >30->gain  11 - 20 lbs Problem list reviewed and updated. FIRST/CF mutation testing/NIPT/QUAD SCREEN/fragile X/Ashkenazi Jewish population testing/Spinal muscular atrophy discussed: requested. Role of ultrasound in pregnancy discussed; fetal survey: requested. Amniocentesis discussed: not indicated. VBAC calculator score: VBAC consent form provided No orders of the defined types were placed in this encounter.   No orders of the defined types were placed in this encounter.    Follow up in 4 weeks. 50% of 30 min visit spent on counseling and coordination of care.

## 2015-02-24 LAB — OBSTETRIC PANEL
Antibody Screen: NEGATIVE
BASOS ABS: 0 10*3/uL (ref 0.0–0.1)
BASOS PCT: 0 % (ref 0–1)
EOS ABS: 0.1 10*3/uL (ref 0.0–0.7)
Eosinophils Relative: 1 % (ref 0–5)
HCT: 35.9 % — ABNORMAL LOW (ref 36.0–46.0)
HEMOGLOBIN: 11.7 g/dL — AB (ref 12.0–15.0)
Hepatitis B Surface Ag: NEGATIVE
Lymphocytes Relative: 28 % (ref 12–46)
Lymphs Abs: 1.9 10*3/uL (ref 0.7–4.0)
MCH: 27.7 pg (ref 26.0–34.0)
MCHC: 32.6 g/dL (ref 30.0–36.0)
MCV: 84.9 fL (ref 78.0–100.0)
MPV: 9.3 fL (ref 8.6–12.4)
Monocytes Absolute: 0.5 10*3/uL (ref 0.1–1.0)
Monocytes Relative: 8 % (ref 3–12)
Neutro Abs: 4.2 10*3/uL (ref 1.7–7.7)
Neutrophils Relative %: 63 % (ref 43–77)
Platelets: 278 10*3/uL (ref 150–400)
RBC: 4.23 MIL/uL (ref 3.87–5.11)
RDW: 15 % (ref 11.5–15.5)
Rh Type: POSITIVE
Rubella: 2.48 Index — ABNORMAL HIGH (ref <0.90)
WBC: 6.7 10*3/uL (ref 4.0–10.5)

## 2015-02-24 LAB — CULTURE, OB URINE
COLONY COUNT: NO GROWTH
Organism ID, Bacteria: NO GROWTH

## 2015-02-24 LAB — VARICELLA ZOSTER ANTIBODY, IGG: VARICELLA IGG: 12.81 {index} (ref <135.00)

## 2015-02-24 LAB — VITAMIN D 25 HYDROXY (VIT D DEFICIENCY, FRACTURES): VIT D 25 HYDROXY: 16 ng/mL — AB (ref 30–100)

## 2015-02-25 ENCOUNTER — Other Ambulatory Visit: Payer: Self-pay | Admitting: Certified Nurse Midwife

## 2015-02-25 LAB — HEMOGLOBINOPATHY EVALUATION
HGB F QUANT: 0 % (ref 0.0–2.0)
HGB S QUANTITAION: 35.5 % — AB
Hemoglobin Other: 0 %
Hgb A2 Quant: 3.2 % (ref 2.2–3.2)
Hgb A: 61.3 % — ABNORMAL LOW (ref 96.8–97.8)

## 2015-03-23 ENCOUNTER — Ambulatory Visit (INDEPENDENT_AMBULATORY_CARE_PROVIDER_SITE_OTHER): Payer: Medicaid Other | Admitting: Certified Nurse Midwife

## 2015-03-23 ENCOUNTER — Encounter: Payer: Self-pay | Admitting: *Deleted

## 2015-03-23 VITALS — BP 91/62 | HR 71 | Temp 98.6°F | Wt 188.0 lb

## 2015-03-23 DIAGNOSIS — Z3482 Encounter for supervision of other normal pregnancy, second trimester: Secondary | ICD-10-CM

## 2015-03-23 LAB — POCT URINALYSIS DIPSTICK
Bilirubin, UA: NEGATIVE
Blood, UA: NEGATIVE
Glucose, UA: NEGATIVE
Ketones, UA: NEGATIVE
LEUKOCYTES UA: NEGATIVE
NITRITE UA: NEGATIVE
PH UA: 7
PROTEIN UA: NEGATIVE
Spec Grav, UA: 1.01
UROBILINOGEN UA: NEGATIVE

## 2015-03-23 NOTE — Progress Notes (Signed)
  Subjective:    Angela [name] is a 29 y.o. female being seen today for her obstetrical visit. She is at 4040w5d gestation. Patient reports: no complaints.  Problem List Items Addressed This Visit    None    Visit Diagnoses    Encounter for supervision of other normal pregnancy in second trimester    -  Primary    Relevant Orders    POCT urinalysis dipstick (Completed)    AFP, Quad Screen    US OB Comp + 14 Wk      Patient Active Problem List   Diagnosis Date Noted  . Supervision of normal intrauterine pregnancy in multigravida 02/23/2015    Objective:     BP 91/62 mmHg  Pulse 71  Temp(Src) 98.6 F (37 C)  Wt 188 lb (85.276 kg)  LMP 11/26/2014 (Exact Date) Uterine Size: Below umbilicus   FHR: 135 by doppler  Assessment:    Pregnancy @ 6740w5d  weeks Doing well    Plan:    Problem list reviewed and updated. Labs reviewed.  Follow up in 4 weeks. FIRST/CF mutation testing/NIPT/QUAD SCREEN/fragile X/Ashkenazi Jewish population testing/Spinal muscular atrophy discussed: ordered. Role of ultrasound in pregnancy discussed; fetal survey: ordered. Amniocentesis discussed: not indicated. 50% of 15 minute visit spent on counseling and coordination of care.

## 2015-03-29 LAB — AFP, QUAD SCREEN
AFP: 49.1 ng/mL
Age Alone: 1:574 {titer}
CURR GEST AGE: 16.5 wks.days
Down Syndrome Scr Risk Est: 1:5540 {titer}
HCG TOTAL: 10.86 [IU]/mL
INH: 333.4 pg/mL
Interpretation-AFP: NEGATIVE
MOM FOR HCG: 0.34
MOM FOR INH: 2.15
MoM for AFP: 1.35
Open Spina bifida: NEGATIVE
Tri 18 Scr Risk Est: POSITIVE — AB
Trisomy 18 (Edward) Syndrome Interp.: 1:44 {titer}
UE3 MOM: 0.49
UE3 VALUE: 0.46 ng/mL

## 2015-03-30 ENCOUNTER — Other Ambulatory Visit: Payer: Self-pay | Admitting: Certified Nurse Midwife

## 2015-03-30 DIAGNOSIS — O280 Abnormal hematological finding on antenatal screening of mother: Secondary | ICD-10-CM

## 2015-04-01 ENCOUNTER — Other Ambulatory Visit: Payer: Medicaid Other

## 2015-04-09 ENCOUNTER — Ambulatory Visit (HOSPITAL_COMMUNITY)
Admission: RE | Admit: 2015-04-09 | Discharge: 2015-04-09 | Disposition: A | Discharge status: Home / Self Care | Payer: Medicaid Other | Source: Ambulatory Visit | Attending: Certified Nurse Midwife | Admitting: Certified Nurse Midwife

## 2015-04-09 ENCOUNTER — Other Ambulatory Visit: Payer: Self-pay | Admitting: Certified Nurse Midwife

## 2015-04-09 ENCOUNTER — Encounter (HOSPITAL_COMMUNITY): Payer: Self-pay

## 2015-04-09 VITALS — BP 104/53 | HR 85 | Wt 192.2 lb

## 2015-04-09 DIAGNOSIS — Z1389 Encounter for screening for other disorder: Secondary | ICD-10-CM

## 2015-04-09 DIAGNOSIS — O280 Abnormal hematological finding on antenatal screening of mother: Secondary | ICD-10-CM | POA: Insufficient documentation

## 2015-04-09 DIAGNOSIS — Z3A19 19 weeks gestation of pregnancy: Secondary | ICD-10-CM

## 2015-04-09 DIAGNOSIS — O283 Abnormal ultrasonic finding on antenatal screening of mother: Secondary | ICD-10-CM | POA: Insufficient documentation

## 2015-04-09 DIAGNOSIS — Z315 Encounter for genetic counseling: Secondary | ICD-10-CM | POA: Insufficient documentation

## 2015-04-09 DIAGNOSIS — O351XX0 Maternal care for (suspected) chromosomal abnormality in fetus, not applicable or unspecified: Secondary | ICD-10-CM | POA: Insufficient documentation

## 2015-04-09 DIAGNOSIS — O34219 Maternal care for unspecified type scar from previous cesarean delivery: Secondary | ICD-10-CM | POA: Insufficient documentation

## 2015-04-09 DIAGNOSIS — IMO0001 Reserved for inherently not codable concepts without codable children: Secondary | ICD-10-CM

## 2015-04-09 HISTORY — DX: Abnormal hematological finding on antenatal screening of mother: O28.0

## 2015-04-09 NOTE — Progress Notes (Signed)
Genetic Counseling  High-Risk Gestation Note  Appointment Date:  04/09/2015 Referred By: Roe Coombsenney, Rachelle A, CNM Date of Birth:  10/21/1985 Partner:  Normajean BaxterJaleel Miller   Pregnancy History: [id number]: [id number] Estimated Date of Delivery: 09/02/15 Estimated Gestational Age: 8547w1d Attending: Particia NearingMartha Decker, MD   Ms. Penne LashJenelle Schweigert and her partner, Mr. Normajean BaxterJaleel Miller, were seen for genetic counseling because of an increased risk for fetal trisomy 18 based on Quad screen [facility]. [facility] Genetic counseling Intern, Marcelene ButteMaggie Powell, assisted with genetic counseling under my direct supervision.   In Summary:  1 in 2644 Trisomy 18 risk from Quad screen  Detailed ultrasound performed today; Normal visualized fetal anatomy  Single umbilical artery visualized today  Follow-up ultrasound for fetal growth scheduled for 05/21/15  Patient elected to pursue NIPS (Panorama); declined amniocentesis  Declined fetal echocardiogram  Patient has sickle cell trait; Father of the pregnancy declined screening for himself today  They were counseled regarding the Quad screen result and the associated 1 in 44 risk for fetal trisomy 518.  We reviewed chromosomes, nondisjunction, and the common features and poor prognosis of trisomy 6318.  In addition, we reviewed the screen adjusted reduction in risks for Down syndrome and ONTDs.  We also discussed other explanations for a screen positive result including: a gestational dating error, differences in maternal metabolism, and normal variation. They understand that this screening is not diagnostic for trisomy 518 but provides a risk assessment.  Detailed ultrasound was performed today. Single umbilical artery was visualized. Complete ultrasound results reported separately.  Penne LashJenelle Venturini was counseled that SUA occurs in approximately 1 in every 200 pregnancies and is defined as the presence of one umbilical artery and one umbilical vein, instead of the typical three vessel  cord containing two arteries and one vein.  We discussed that the finding of an isolated SUA (no other markers or anomalies visualized) is not thought to increase the risk for fetal aneuploidy. However, the literature is conflicting regarding an association between this finding and congenital heart defects.  In addition, we discussed that SUA is known to be associated with an increased risk for fetal growth restriction.  We reviewed the options of serial ultrasound to monitor fetal growth and a fetal echocardiogram.    We reviewed additional available screening option of noninvasive prenatal screening (NIPS)/cell free DNA (cfDNA) testing. They were counseled that screening tests are used to modify a patient's a priori risk for aneuploidy, typically based on age. This estimate provides a pregnancy specific risk assessment. We reviewed the benefits and limitations of each option. Specifically, we discussed the conditions for which each test screens, the detection rates, and false positive rates of each. They were also counseled regarding diagnostic testing via amniocentesis. We reviewed the approximate 1 in 300-500 risk for complications for amniocentesis, including spontaneous pregnancy loss.   After consideration of all the options, she elected to proceed with NIPS ([facility]).  Those results will be available in 8-10 days.  Diagnostic testing was declined today. They understand/s that screening tests cannot rule out all birth defects or genetic syndromes. The patient was advised of this limitation and states she still does not want additional testing at this time. Follow-up ultrasound was scheduled for 05/21/15. They declined fetal echocardiogram at this time.   Ms. 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 We discussed that sickle cell anemia (SCA) affects the shape  and  function of the red blood cell by producing abnormal hemoglobin. They were counseled that hemoglobin is a protein in the RBCs that carries oxygen to the body's organs. Individuals who have SCA have a changes within the genes that codes for hemoglobin. This couple was counseled that SCA is inherited in an autosomal recessive manner, and occurs when both copies of the hemoglobin gene are changed and produce an abnormal hemoglobin S. Typically, one abnormal gene for the production of hemoglobin S is inherited from each parent. A carrier of SCA has one altered copy of the gene for hemoglobin and one typical working copy. Carriers of recessive conditions typically do not have symptoms related to the condition because they still have one functioning copy of the gene, and thus some production of the typical protein coded for by that gene.  Given the recessive inheritance, we discussed the importance of understanding Mr. Rondel Baton carrier status in order to accurately predict the risk of a hemoglobinopathy in the fetus. Mr. Hyacinth Meeker reported that he does not have sickle cell trait but it was unclear if he has had screening previously to assess this. He reported no family history of sickle cell, and consanguinity to Ms. Mcquade was denied. The couple's son reportedly also has sickle cell trait. We discussed that other hemoglobin variants (hemoglobin C), when combined with hemoglobin S, can cause a medically significant hemoglobinopathy. Mr. Hyacinth Meeker was offered the option of repeat testing (hemoglobin electrophoresis) to determine whether he has any hemoglobin variant, including SCT. He declined this testing today. They understand that an accurate risk assessment cannot be performed without further information. We also reviewed the availability of newborn screening in [place] for hemoglobinopathies.   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  This reportedly closed on its own, and he did not require surgical or other medical treatment. He reported no additional health issues for himself. Congenital heart defects occur in approximately 1% of pregnancies.  Congenital heart defects may occur due to multifactorial influences, chromosomal abnormalities, genetic syndromes or environmental exposures.  Isolated heart defects are generally multifactorial.  Given the reported family history and assuming multifactorial inheritance, the risk for a congenital heart defect in the current pregnancy would be approximately 2-3%. We reviewed the options of detailed ultrasound and fetal echocardiogram to assess fetal heart development. They declined fetal echocardiogram at this time. Without further information regarding the provided family history, an accurate genetic risk cannot be calculated. Further genetic counseling is warranted if more information is obtained.  Ms. Blocher reported a paternal uncle with club foot and a paternal half-sister's daughter with club foot. These individuals are reportedly otherwise healthy. Club foot occurs in approximately 1-3 in 1,000 live births.  This birth defect is most often an isolated condition, but can be present in combination with other birth defects as part of a genetic syndrome.  When there is no syndrome as the cause, then the club foot is thought to be caused by a combination of genetic and environmental factors, called multifactorial inheritance. Data are limited regarding recurrence risk estimate for extended degree relatives. Given the reported family history, recurrence risk is likely close to the general population risk for the current pregnancy.   Ms. Jaya Lapka reported that her father died from pancreatic cancer and reported a paternal aunt with breast cancer diagnosed in her 46's. Though most cancers are thought to be sporadic or due to environmental factors, some families  appear to have a strong  predisposition to cancers.  When considering a family history of cancer, we look for common types of cancer in multiple family members occurring at younger than typical ages. we discussed the option of meeting with a cancer genetic counselor to discuss any possible screening or testing options available. If they are concerned about the family history of cancer and would like to learn more about the family's chance for an inherited cancer syndrome, she may be referred to [facility] [phone]). Additional information regarding the reported family history may alter recurrence risk estimate for relatives.   Ms. Linares denied exposure to environmental toxins or chemical agents. She denied the use of alcohol, tobacco or street drugs. She denied significant viral illnesses during the course of her pregnancy. Her medical and surgical histories were noncontributory.   I counseled this couple for approximately 40 minutes regarding the above risks and available options.   Quinn Plowman, MS,  Certified Genetic Counselor 04/09/2015

## 2015-04-12 ENCOUNTER — Other Ambulatory Visit: Payer: Self-pay | Admitting: Certified Nurse Midwife

## 2015-04-16 ENCOUNTER — Telehealth (HOSPITAL_COMMUNITY): Payer: Self-pay | Admitting: MS"

## 2015-04-16 NOTE — Telephone Encounter (Signed)
Left message for patient to return call.   Angela [name] 04/16/2015 1:54 PM

## 2015-04-19 ENCOUNTER — Other Ambulatory Visit (HOSPITAL_COMMUNITY): Payer: Self-pay

## 2015-04-19 NOTE — Telephone Encounter (Signed)
Attempted to call patient regarding results of Panorama (prenatal cell free DNA testing), which are within normal limits. Patient did not answer. Left message to return call.   Clydie BraunKaren Cinthya Bors 04/19/2015 9:24 AM

## 2015-04-20 ENCOUNTER — Ambulatory Visit (INDEPENDENT_AMBULATORY_CARE_PROVIDER_SITE_OTHER): Payer: Medicaid Other | Admitting: Certified Nurse Midwife

## 2015-04-20 VITALS — BP 107/73 | HR 73 | Temp 98.8°F | Wt 188.0 lb

## 2015-04-20 DIAGNOSIS — Z3482 Encounter for supervision of other normal pregnancy, second trimester: Secondary | ICD-10-CM

## 2015-04-20 LAB — POCT URINALYSIS DIPSTICK
BILIRUBIN UA: NEGATIVE
Glucose, UA: NEGATIVE
KETONES UA: NEGATIVE
Leukocytes, UA: NEGATIVE
Nitrite, UA: NEGATIVE
PH UA: 6
Protein, UA: NEGATIVE
RBC UA: NEGATIVE
Spec Grav, UA: 1.015
Urobilinogen, UA: NEGATIVE

## 2015-04-20 NOTE — Progress Notes (Signed)
Subjective:    Angela [name] is a 29 y.o. female being seen today for her obstetrical visit. She is at 509w5d gestation. Patient reports: no complaints . Fetal movement: normal.  Problem List Items Addressed This Visit    None    Visit Diagnoses    Encounter for supervision of other normal pregnancy in second trimester    -  Primary    Relevant Orders    POCT urinalysis dipstick (Completed)      Patient Active Problem List   Diagnosis Date Noted  . Abnormal maternal serum screening test 04/09/2015  . Supervision of normal intrauterine pregnancy in multigravida 02/23/2015   Objective:    BP 107/73 mmHg  Pulse 73  Temp(Src) 98.8 F (37.1 C)  Wt 188 lb (85.276 kg)  LMP 11/26/2014 (Exact Date) FHT: 145 BPM  Uterine Size: size equals dates     Assessment:    Pregnancy @ 769w5d    Plan:    OBGCT: discussed. Signs and symptoms of preterm labor: discussed.  Labs, problem list reviewed and updated 2 hr GTT planned Follow up in 4 weeks.

## 2015-04-22 ENCOUNTER — Telehealth (HOSPITAL_COMMUNITY): Payer: Self-pay | Admitting: MS"

## 2015-04-22 NOTE — Telephone Encounter (Signed)
Returning patient's call from earlier today. Patient did not answer. Left voicemail with number for patient to call back.  Clydie BraunKaren Lynard Postlewait 04/22/2015 11:38 AM

## 2015-04-23 ENCOUNTER — Telehealth (HOSPITAL_COMMUNITY): Payer: Self-pay | Admitting: MS"

## 2015-04-23 ENCOUNTER — Other Ambulatory Visit: Payer: Self-pay | Admitting: Certified Nurse Midwife

## 2015-04-23 NOTE — Telephone Encounter (Signed)
Called Angela [name] to discuss her prenatal cell free DNA test results.  Angela [name] had Panorama testing through [facility].  Testing was offered because of Trisomy 18 risk from Quad screen.   The patient was identified by name and DOB.  We reviewed that these are within normal limits, showing a less than 1 in 10,000 risk for trisomies 21, 18 and 13, and monosomy X (Turner syndrome).  In addition, the risk for triploidy/vanishing twin and sex chromosome trisomies (47,XXX and 47,XXY) was also low risk.  We reviewed that this testing identifies > 99% of pregnancies with trisomy 5521, trisomy 7113, sex chromosome trisomies (47,XXX and 47,XXY), and triploidy. The detection rate for trisomy 18 is 96%.  The detection rate for monosomy X is ~92%.  The false positive rate is <0.1% for all conditions. The patient did not wish to know fetal sex, and thus, this was not disclosed on the report.  She understands that this testing does not identify all genetic conditions.  All questions were answered to her satisfaction, she was encouraged to call with additional questions or concerns.  Quinn PlowmanKaren Zeah Germano, MS Certified Genetic Counselor 04/23/2015  9:03 AM

## 2015-05-18 ENCOUNTER — Ambulatory Visit (INDEPENDENT_AMBULATORY_CARE_PROVIDER_SITE_OTHER): Payer: Medicaid Other | Admitting: Certified Nurse Midwife

## 2015-05-18 VITALS — BP 99/59 | HR 79 | Temp 98.7°F | Wt 194.0 lb

## 2015-05-18 DIAGNOSIS — Q270 Congenital absence and hypoplasia of umbilical artery: Secondary | ICD-10-CM

## 2015-05-18 DIAGNOSIS — O0990 Supervision of high risk pregnancy, unspecified, unspecified trimester: Secondary | ICD-10-CM

## 2015-05-18 DIAGNOSIS — R772 Abnormality of alphafetoprotein: Secondary | ICD-10-CM

## 2015-05-18 NOTE — Progress Notes (Signed)
Subjective:    Angela [name] is a [age] y.o. female being seen today for her obstetrical visit. She is at 6855w5d gestation. Patient reports: no complaints . Fetal movement: normal.  States she has a bill for ultrasound and would like it corrected, encouraged patient to bring it to her next ROB visit and we can deal with the issue then, if not already resolved.    Problem List Items Addressed This Visit    None    Visit Diagnoses    Supervision of other normal pregnancy, antepartum, second trimester    -  Primary    Relevant Orders    POCT urinalysis dipstick      Patient Active Problem List   Diagnosis Date Noted  . Abnormal maternal serum screening test 04/09/2015  . Supervision of normal intrauterine pregnancy in multigravida 02/23/2015   Objective:    BP 99/59 mmHg  Pulse 79  Temp(Src) 98.7 F (37.1 C)  Wt 194 lb (87.998 kg)  LMP 11/26/2014 (Exact Date) FHT: 135 BPM  Uterine Size: size equals dates     Assessment:    Pregnancy @ 3955w5d    Plan:    OBGCT: discussed and ordered for next visit. Signs and symptoms of preterm labor: discussed.  Labs, problem list reviewed and updated 2 hr GTT planned Follow up in 3 weeks.

## 2015-05-21 ENCOUNTER — Encounter (HOSPITAL_COMMUNITY): Payer: Self-pay

## 2015-05-21 ENCOUNTER — Ambulatory Visit (HOSPITAL_COMMUNITY)
Admission: RE | Admit: 2015-05-21 | Discharge: 2015-05-21 | Disposition: A | Discharge status: Home / Self Care | Payer: Medicaid Other | Source: Ambulatory Visit | Attending: Certified Nurse Midwife | Admitting: Certified Nurse Midwife

## 2015-05-21 ENCOUNTER — Other Ambulatory Visit (HOSPITAL_COMMUNITY): Payer: Self-pay | Admitting: Maternal and Fetal Medicine

## 2015-05-21 DIAGNOSIS — Z832 Family history of diseases of the blood and blood-forming organs and certain disorders involving the immune mechanism: Secondary | ICD-10-CM

## 2015-05-21 DIAGNOSIS — IMO0001 Reserved for inherently not codable concepts without codable children: Secondary | ICD-10-CM

## 2015-05-21 DIAGNOSIS — O34219 Maternal care for unspecified type scar from previous cesarean delivery: Secondary | ICD-10-CM

## 2015-05-21 DIAGNOSIS — O289 Unspecified abnormal findings on antenatal screening of mother: Secondary | ICD-10-CM

## 2015-05-21 DIAGNOSIS — O283 Abnormal ultrasonic finding on antenatal screening of mother: Secondary | ICD-10-CM | POA: Diagnosis present

## 2015-05-21 DIAGNOSIS — Z3A25 25 weeks gestation of pregnancy: Secondary | ICD-10-CM | POA: Insufficient documentation

## 2015-06-01 ENCOUNTER — Other Ambulatory Visit: Payer: Self-pay | Admitting: Certified Nurse Midwife

## 2015-06-09 ENCOUNTER — Other Ambulatory Visit: Payer: Medicaid Other

## 2015-06-09 ENCOUNTER — Ambulatory Visit (INDEPENDENT_AMBULATORY_CARE_PROVIDER_SITE_OTHER): Payer: Medicaid Other | Admitting: Certified Nurse Midwife

## 2015-06-09 VITALS — BP 112/69 | HR 75 | Temp 98.2°F | Wt 196.0 lb

## 2015-06-09 DIAGNOSIS — D573 Sickle-cell trait: Secondary | ICD-10-CM

## 2015-06-09 DIAGNOSIS — Z3482 Encounter for supervision of other normal pregnancy, second trimester: Secondary | ICD-10-CM

## 2015-06-09 DIAGNOSIS — K644 Residual hemorrhoidal skin tags: Secondary | ICD-10-CM

## 2015-06-09 HISTORY — DX: Residual hemorrhoidal skin tags: K64.4

## 2015-06-09 LAB — POCT URINALYSIS DIPSTICK
BILIRUBIN UA: NEGATIVE
Blood, UA: NEGATIVE
Glucose, UA: NEGATIVE
Ketones, UA: NEGATIVE
LEUKOCYTES UA: NEGATIVE
NITRITE UA: NEGATIVE
PH UA: 6
PROTEIN UA: NEGATIVE
Spec Grav, UA: 1.01
Urobilinogen, UA: NEGATIVE

## 2015-06-09 LAB — CBC
HCT: 39.5 % (ref 36.0–46.0)
HEMOGLOBIN: 12.7 g/dL (ref 12.0–15.0)
MCH: 28.2 pg (ref 26.0–34.0)
MCHC: 32.2 g/dL (ref 30.0–36.0)
MCV: 87.6 fL (ref 78.0–100.0)
MPV: 9.8 fL (ref 8.6–12.4)
PLATELETS: 263 10*3/uL (ref 150–400)
RBC: 4.51 MIL/uL (ref 3.87–5.11)
RDW: 15.1 % (ref 11.5–15.5)
WBC: 8 10*3/uL (ref 4.0–10.5)

## 2015-06-09 MED ORDER — HYDROCORTISONE ACETATE 25 MG RE SUPP: 25.0000 mg | Freq: Two times a day (BID) | RECTAL | Status: DC

## 2015-06-09 MED ORDER — DOCUSATE SODIUM 100 MG PO CAPS: 100.0000 mg | ORAL_CAPSULE | Freq: Two times a day (BID) | ORAL | Status: DC
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
## 2015-06-10 LAB — GLUCOSE TOLERANCE, 2 HOURS W/ 1HR
GLUCOSE, 2 HOUR: 105 mg/dL (ref 70–139)
GLUCOSE: 141 mg/dL (ref 70–170)
Glucose, Fasting: 81 mg/dL (ref 65–99)

## 2015-06-10 LAB — RPR

## 2015-06-10 LAB — HIV ANTIBODY (ROUTINE TESTING W REFLEX): HIV: NONREACTIVE

## 2015-06-23 ENCOUNTER — Encounter: Payer: Medicaid Other | Admitting: Certified Nurse Midwife

## 2015-06-23 ENCOUNTER — Ambulatory Visit (INDEPENDENT_AMBULATORY_CARE_PROVIDER_SITE_OTHER): Payer: Medicaid Other | Admitting: Certified Nurse Midwife

## 2015-06-23 VITALS — BP 107/72 | HR 63 | Temp 98.9°F | Wt 192.0 lb

## 2015-06-23 DIAGNOSIS — O2693 Pregnancy related conditions, unspecified, third trimester: Secondary | ICD-10-CM

## 2015-06-23 DIAGNOSIS — Z3483 Encounter for supervision of other normal pregnancy, third trimester: Secondary | ICD-10-CM

## 2015-06-23 LAB — POCT URINALYSIS DIPSTICK
BILIRUBIN UA: NEGATIVE
GLUCOSE UA: NEGATIVE
Leukocytes, UA: NEGATIVE
NITRITE UA: NEGATIVE
PH UA: 6
Protein, UA: NEGATIVE
RBC UA: NEGATIVE
SPEC GRAV UA: 1.015
Urobilinogen, UA: NEGATIVE

## 2015-06-23 NOTE — Progress Notes (Signed)
Subjective:    Angela [name] is a 30 y.o. female being seen today for her obstetrical visit. She is at [redacted]w[redacted]d gestation. Patient reports no complaints. Fetal movement: normal.  Patient reports fall on 06/22/15 while walking across the street.  Fell on her side not on abdomen.  Denies any vaginal bleeding and reports normal fetal movement afterwards.  No bruising post fall.  Fetus hx of single umbilical artery vein and elevated AFP, had [place] scheduled for 2/10.  Moved up [place] to 06/24/15 d/t post fall.    Problem List Items Addressed This Visit    None    Visit Diagnoses    Encounter for supervision of other normal pregnancy in third trimester    -  Primary    Relevant Orders    POCT urinalysis dipstick (Completed)      Patient Active Problem List   Diagnosis Date Noted  . Hemorrhoidal skin tags 06/09/2015  . Sickle cell trait (HCC) 06/09/2015  . Abnormal maternal serum screening test 04/09/2015  . Supervision of normal intrauterine pregnancy in multigravida 02/23/2015   Objective:    BP 107/72 mmHg  Pulse 63  Temp(Src) 98.9 F (37.2 C)  Wt 192 lb (87.091 kg)  LMP 11/26/2014 (Exact Date) FHT:  145 BPM  Uterine Size: unable to determine d/t maternal body habitus  Presentation: unsure   NST: = 10X10 accels, moderate variability, no decels.  No contractions in 40 minutes.  Cat. 1 tracing.   Assessment:    Pregnancy @ [redacted]w[redacted]d weeks   Reactive NST  Status post fall during pregnancy  Maternal obesity  Single umbilical UA noted on [place]  Repeat C-section  Plan:     labs reviewed, problem list updated Consent signed. GBS planning TDAP offered  Rhogam given for RH negative Pediatrician: discussed. Infant feeding: plans to breastfeed. Maternity leave: discussed. Cigarette smoking: never smoked. Orders Placed This Encounter  Procedures  . POCT urinalysis dipstick   No orders of the defined types were placed in this encounter.   Follow up in 2 Weeks.

## 2015-06-23 NOTE — Progress Notes (Signed)
Pt states that she fell on her left side yesterday along with her toddler.  Pt states that she was not seen for evaluation after fall.  Pt states that she has had some crampiness, denies bleeding or loss of fluid.

## 2015-06-24 ENCOUNTER — Ambulatory Visit (HOSPITAL_COMMUNITY)
Admission: RE | Admit: 2015-06-24 | Discharge: 2015-06-24 | Disposition: A | Discharge status: Home / Self Care | Payer: Medicaid Other | Source: Ambulatory Visit | Attending: Certified Nurse Midwife | Admitting: Certified Nurse Midwife

## 2015-06-24 ENCOUNTER — Encounter (HOSPITAL_COMMUNITY): Payer: Self-pay

## 2015-06-24 ENCOUNTER — Other Ambulatory Visit (HOSPITAL_COMMUNITY): Payer: Self-pay | Admitting: Maternal and Fetal Medicine

## 2015-06-24 VITALS — BP 103/67 | HR 79 | Wt 191.6 lb

## 2015-06-24 DIAGNOSIS — Z3A30 30 weeks gestation of pregnancy: Secondary | ICD-10-CM | POA: Diagnosis not present

## 2015-06-24 DIAGNOSIS — IMO0001 Reserved for inherently not codable concepts without codable children: Secondary | ICD-10-CM

## 2015-06-24 DIAGNOSIS — O34219 Maternal care for unspecified type scar from previous cesarean delivery: Secondary | ICD-10-CM | POA: Diagnosis not present

## 2015-06-24 DIAGNOSIS — O283 Abnormal ultrasonic finding on antenatal screening of mother: Secondary | ICD-10-CM | POA: Insufficient documentation

## 2015-06-24 DIAGNOSIS — O280 Abnormal hematological finding on antenatal screening of mother: Secondary | ICD-10-CM

## 2015-06-25 ENCOUNTER — Other Ambulatory Visit: Payer: Self-pay | Admitting: Certified Nurse Midwife

## 2015-06-25 LAB — CULTURE, OB URINE

## 2015-07-01 ENCOUNTER — Ambulatory Visit (HOSPITAL_COMMUNITY)
Admission: RE | Admit: 2015-07-01 | Discharge: 2015-07-01 | Disposition: A | Discharge status: Home / Self Care | Payer: Medicaid Other | Source: Ambulatory Visit | Attending: Certified Nurse Midwife | Admitting: Certified Nurse Midwife

## 2015-07-01 ENCOUNTER — Other Ambulatory Visit (HOSPITAL_COMMUNITY): Payer: Self-pay | Admitting: Maternal and Fetal Medicine

## 2015-07-01 ENCOUNTER — Encounter (HOSPITAL_COMMUNITY): Payer: Self-pay

## 2015-07-01 DIAGNOSIS — IMO0001 Reserved for inherently not codable concepts without codable children: Secondary | ICD-10-CM

## 2015-07-01 DIAGNOSIS — D573 Sickle-cell trait: Secondary | ICD-10-CM

## 2015-07-01 DIAGNOSIS — O34219 Maternal care for unspecified type scar from previous cesarean delivery: Secondary | ICD-10-CM

## 2015-07-01 DIAGNOSIS — O403XX0 Polyhydramnios, third trimester, not applicable or unspecified: Secondary | ICD-10-CM

## 2015-07-01 DIAGNOSIS — Z3A31 31 weeks gestation of pregnancy: Secondary | ICD-10-CM | POA: Diagnosis not present

## 2015-07-01 DIAGNOSIS — O283 Abnormal ultrasonic finding on antenatal screening of mother: Secondary | ICD-10-CM | POA: Diagnosis not present

## 2015-07-01 DIAGNOSIS — O280 Abnormal hematological finding on antenatal screening of mother: Secondary | ICD-10-CM

## 2015-07-02 ENCOUNTER — Ambulatory Visit (HOSPITAL_COMMUNITY): Payer: Medicaid Other

## 2015-07-06 ENCOUNTER — Other Ambulatory Visit: Payer: Self-pay | Admitting: Certified Nurse Midwife

## 2015-07-07 ENCOUNTER — Ambulatory Visit (INDEPENDENT_AMBULATORY_CARE_PROVIDER_SITE_OTHER): Payer: Medicaid Other | Admitting: Certified Nurse Midwife

## 2015-07-07 VITALS — BP 105/72 | HR 93 | Temp 98.1°F | Wt 195.0 lb

## 2015-07-07 DIAGNOSIS — Z3483 Encounter for supervision of other normal pregnancy, third trimester: Secondary | ICD-10-CM

## 2015-07-07 LAB — POCT URINALYSIS DIPSTICK
Bilirubin, UA: NEGATIVE
Blood, UA: NEGATIVE
GLUCOSE UA: NEGATIVE
KETONES UA: NEGATIVE
LEUKOCYTES UA: NEGATIVE
Nitrite, UA: NEGATIVE
PROTEIN UA: NEGATIVE
SPEC GRAV UA: 1.01
Urobilinogen, UA: NEGATIVE
pH, UA: 7

## 2015-07-07 MED ORDER — TRIPLE ANTIBIOTIC 5-400-5000 EX OINT: TOPICAL_OINTMENT | Freq: Four times a day (QID) | CUTANEOUS | Status: DC

## 2015-07-07 NOTE — Progress Notes (Signed)
Subjective:    Angela [name] is a 30 y.o. female being seen today for her obstetrical visit. She is at [redacted]w[redacted]d gestation. Patient reports backache, no bleeding, no cramping, no leaking and occasional contractions. Fetal movement: normal.  Discussed implications of polyhydramnios with patient.    Problem List Items Addressed This Visit    None    Visit Diagnoses    Supervision of other normal pregnancy, antepartum, third trimester    -  Primary    Relevant Orders    POCT urinalysis dipstick (Completed)      Patient Active Problem List   Diagnosis Date Noted  . Hemorrhoidal skin tags 06/09/2015  . Sickle cell trait (HCC) 06/09/2015  . Abnormal maternal serum screening test 04/09/2015  . Supervision of normal intrauterine pregnancy in multigravida 02/23/2015   Objective:    BP 105/72 mmHg  Pulse 93  Temp(Src) 98.1 F (36.7 C)  Wt 195 lb (88.451 kg)  LMP 11/26/2014 (Exact Date) FHT:  145 BPM  Uterine Size: size greater than dates  Presentation: cephalic   Skin abrasion right knee is healing, has crusting with scab, no s/s infection, continue to monitor  Assessment:    Pregnancy @ [redacted]w[redacted]d weeks   Polyhydramnios  Lumbar back pain  H/O fall during pregnancy  Repeat C-section  Plan:   Rx: abdominal maternity support belt   labs reviewed, problem list updated Consent signed. GBS planning TDAP offered  Rhogam given for RH negative Pediatrician: discussed. Infant feeding: plans to breastfeed. Maternity leave: discussed. Cigarette smoking: never smoked. Orders Placed This Encounter  Procedures  . POCT urinalysis dipstick   No orders of the defined types were placed in this encounter.   Follow up in 2 Weeks.

## 2015-07-08 ENCOUNTER — Ambulatory Visit (HOSPITAL_COMMUNITY)
Admission: RE | Admit: 2015-07-08 | Discharge: 2015-07-08 | Disposition: A | Discharge status: Home / Self Care | Payer: Medicaid Other | Source: Ambulatory Visit | Attending: Certified Nurse Midwife | Admitting: Certified Nurse Midwife

## 2015-07-08 ENCOUNTER — Encounter (HOSPITAL_COMMUNITY): Payer: Self-pay

## 2015-07-08 ENCOUNTER — Other Ambulatory Visit (HOSPITAL_COMMUNITY): Payer: Self-pay | Admitting: Maternal and Fetal Medicine

## 2015-07-08 DIAGNOSIS — Z3A32 32 weeks gestation of pregnancy: Secondary | ICD-10-CM | POA: Diagnosis not present

## 2015-07-08 DIAGNOSIS — O403XX0 Polyhydramnios, third trimester, not applicable or unspecified: Secondary | ICD-10-CM

## 2015-07-08 DIAGNOSIS — O283 Abnormal ultrasonic finding on antenatal screening of mother: Secondary | ICD-10-CM | POA: Insufficient documentation

## 2015-07-08 DIAGNOSIS — O2693 Pregnancy related conditions, unspecified, third trimester: Secondary | ICD-10-CM

## 2015-07-08 DIAGNOSIS — O34219 Maternal care for unspecified type scar from previous cesarean delivery: Secondary | ICD-10-CM | POA: Diagnosis not present

## 2015-07-08 DIAGNOSIS — O289 Unspecified abnormal findings on antenatal screening of mother: Secondary | ICD-10-CM

## 2015-07-15 ENCOUNTER — Ambulatory Visit (HOSPITAL_COMMUNITY): Payer: Medicaid Other

## 2015-07-15 ENCOUNTER — Other Ambulatory Visit: Payer: Self-pay | Admitting: Certified Nurse Midwife

## 2015-07-16 ENCOUNTER — Ambulatory Visit (HOSPITAL_COMMUNITY)
Admission: RE | Admit: 2015-07-16 | Discharge: 2015-07-16 | Disposition: A | Discharge status: Home / Self Care | Payer: Medicaid Other | Source: Ambulatory Visit | Attending: Certified Nurse Midwife | Admitting: Certified Nurse Midwife

## 2015-07-16 ENCOUNTER — Other Ambulatory Visit (HOSPITAL_COMMUNITY): Payer: Self-pay | Admitting: Maternal and Fetal Medicine

## 2015-07-16 ENCOUNTER — Encounter (HOSPITAL_COMMUNITY): Payer: Self-pay

## 2015-07-16 DIAGNOSIS — O403XX0 Polyhydramnios, third trimester, not applicable or unspecified: Secondary | ICD-10-CM | POA: Insufficient documentation

## 2015-07-16 DIAGNOSIS — O283 Abnormal ultrasonic finding on antenatal screening of mother: Secondary | ICD-10-CM | POA: Insufficient documentation

## 2015-07-16 DIAGNOSIS — IMO0001 Reserved for inherently not codable concepts without codable children: Secondary | ICD-10-CM

## 2015-07-16 DIAGNOSIS — O280 Abnormal hematological finding on antenatal screening of mother: Secondary | ICD-10-CM

## 2015-07-16 DIAGNOSIS — O34219 Maternal care for unspecified type scar from previous cesarean delivery: Secondary | ICD-10-CM

## 2015-07-16 DIAGNOSIS — Z3A33 33 weeks gestation of pregnancy: Secondary | ICD-10-CM | POA: Diagnosis not present

## 2015-07-16 DIAGNOSIS — O2693 Pregnancy related conditions, unspecified, third trimester: Secondary | ICD-10-CM

## 2015-07-20 ENCOUNTER — Other Ambulatory Visit: Payer: Self-pay | Admitting: Certified Nurse Midwife

## 2015-07-21 ENCOUNTER — Ambulatory Visit (INDEPENDENT_AMBULATORY_CARE_PROVIDER_SITE_OTHER): Payer: Medicaid Other | Admitting: Certified Nurse Midwife

## 2015-07-21 VITALS — BP 114/73 | HR 97 | Temp 98.5°F | Wt 200.0 lb

## 2015-07-21 DIAGNOSIS — O0993 Supervision of high risk pregnancy, unspecified, third trimester: Secondary | ICD-10-CM

## 2015-07-21 DIAGNOSIS — N760 Acute vaginitis: Secondary | ICD-10-CM

## 2015-07-21 LAB — POCT URINALYSIS DIPSTICK
Bilirubin, UA: NEGATIVE
Blood, UA: NEGATIVE
Glucose, UA: NEGATIVE
Ketones, UA: NEGATIVE
Leukocytes, UA: NEGATIVE
Nitrite, UA: NEGATIVE
Protein, UA: NEGATIVE
Spec Grav, UA: 1.01
Urobilinogen, UA: NEGATIVE
pH, UA: 5

## 2015-07-21 MED ORDER — TERCONAZOLE 0.4 % VA CREA: 1.0000 | TOPICAL_CREAM | Freq: Every day | VAGINAL | Status: DC

## 2015-07-21 MED ORDER — FLUCONAZOLE 100 MG PO TABS: 100.0000 mg | ORAL_TABLET | Freq: Once | ORAL | Status: DC

## 2015-07-21 MED ORDER — VITAFOL GUMMIES 3.33-0.333-34.8 MG PO CHEW: 3.0000 | CHEWABLE_TABLET | Freq: Every day | ORAL | Status: DC

## 2015-07-21 NOTE — Addendum Note (Signed)
Addended by: Marya Landry D on: 07/21/2015 04:30 PM   Modules accepted: Orders

## 2015-07-21 NOTE — Progress Notes (Signed)
Subjective:    Angela [name] is a 30 y.o. female being seen today for her obstetrical visit. She is at [redacted]w[redacted]d gestation. Patient reports backache, no bleeding, no contractions, no cramping, no leaking and vaginal irritation. Fetal movement: normal.  Problem List Items Addressed This Visit    None    Visit Diagnoses    Vaginitis    -  Primary    Relevant Medications    fluconazole (DIFLUCAN) 100 MG tablet    terconazole (TERAZOL 7) 0.4 % vaginal cream    Supervision of high risk pregnancy in third trimester        Relevant Medications    Prenatal Vit-Fe Phos-FA-Omega (VITAFOL GUMMIES) 3.33-0.333-34.8 MG CHEW      Patient Active Problem List   Diagnosis Date Noted  . Hemorrhoidal skin tags 06/09/2015  . Sickle cell trait (HCC) 06/09/2015  . Abnormal maternal serum screening test 04/09/2015  . Supervision of normal intrauterine pregnancy in multigravida 02/23/2015   Objective:    BP 114/73 mmHg  Pulse 97  Temp(Src) 98.5 F (36.9 C)  Wt 200 lb (90.719 kg)  LMP 11/26/2014 (Exact Date) FHT:  140 BPM  Uterine Size: size greater than dates  Presentation: unsure     Assessment:    Pregnancy @ [redacted]w[redacted]d weeks   Repeat C-Section  High risk pregnancy: polyhydramnios, single UA  Vaginitis  Plan:     labs reviewed, problem list updated Consent signed. GBS planning TDAP offered  Rhogam given for RH negative Pediatrician: discussed. Infant feeding: plans to breastfeed. Maternity leave: N/A. Cigarette smoking: never smoked. No orders of the defined types were placed in this encounter.   Meds ordered this encounter  Medications  . Prenatal Vit-Fe Phos-FA-Omega (VITAFOL GUMMIES) 3.33-0.333-34.8 MG CHEW    Sig: Chew 3 tablets by mouth daily.    Dispense:  90 tablet    Refill:  12  . fluconazole (DIFLUCAN) 100 MG tablet    Sig: Take 1 tablet (100 mg total) by mouth once. Repeat dose in 48-72 hour.    Dispense:  3 tablet    Refill:  0  . terconazole (TERAZOL 7) 0.4 % vaginal  cream    Sig: Place 1 applicator vaginally at bedtime.    Dispense:  45 g    Refill:  0   Follow up in 2 Weeks with NST & GBS.

## 2015-07-21 NOTE — Addendum Note (Signed)
Addended by: Henriette Combs on: 07/21/2015 12:00 PM   Modules accepted: Orders

## 2015-07-22 ENCOUNTER — Ambulatory Visit (HOSPITAL_COMMUNITY)
Admission: RE | Admit: 2015-07-22 | Discharge: 2015-07-22 | Disposition: A | Discharge status: Home / Self Care | Payer: Medicaid Other | Source: Ambulatory Visit | Attending: Certified Nurse Midwife | Admitting: Certified Nurse Midwife

## 2015-07-22 ENCOUNTER — Other Ambulatory Visit (HOSPITAL_COMMUNITY): Payer: Self-pay | Admitting: Maternal and Fetal Medicine

## 2015-07-22 VITALS — BP 108/61 | HR 65 | Wt 199.0 lb

## 2015-07-22 DIAGNOSIS — IMO0001 Reserved for inherently not codable concepts without codable children: Secondary | ICD-10-CM

## 2015-07-22 DIAGNOSIS — O289 Unspecified abnormal findings on antenatal screening of mother: Secondary | ICD-10-CM

## 2015-07-22 DIAGNOSIS — O2693 Pregnancy related conditions, unspecified, third trimester: Secondary | ICD-10-CM

## 2015-07-22 DIAGNOSIS — O280 Abnormal hematological finding on antenatal screening of mother: Secondary | ICD-10-CM

## 2015-07-22 DIAGNOSIS — O283 Abnormal ultrasonic finding on antenatal screening of mother: Secondary | ICD-10-CM | POA: Insufficient documentation

## 2015-07-22 DIAGNOSIS — O34219 Maternal care for unspecified type scar from previous cesarean delivery: Secondary | ICD-10-CM | POA: Insufficient documentation

## 2015-07-22 DIAGNOSIS — Z3A37 37 weeks gestation of pregnancy: Secondary | ICD-10-CM

## 2015-07-22 DIAGNOSIS — O403XX0 Polyhydramnios, third trimester, not applicable or unspecified: Secondary | ICD-10-CM

## 2015-07-22 DIAGNOSIS — Z3A34 34 weeks gestation of pregnancy: Secondary | ICD-10-CM | POA: Diagnosis not present

## 2015-07-22 DIAGNOSIS — Z3A36 36 weeks gestation of pregnancy: Secondary | ICD-10-CM

## 2015-07-26 LAB — SURESWAB, VAGINOSIS/VAGINITIS PLUS
Atopobium vaginae: NOT DETECTED Log (cells/mL)
C. ALBICANS, DNA: DETECTED — AB
C. GLABRATA, DNA: NOT DETECTED
C. PARAPSILOSIS, DNA: NOT DETECTED
C. TROPICALIS, DNA: NOT DETECTED
C. trachomatis RNA, TMA: NOT DETECTED
LACTOBACILLUS SPECIES: NOT DETECTED Log (cells/mL)
MEGASPHAERA SPECIES: NOT DETECTED Log (cells/mL)
N. gonorrhoeae RNA, TMA: NOT DETECTED
T. VAGINALIS RNA, QL TMA: NOT DETECTED

## 2015-07-28 ENCOUNTER — Other Ambulatory Visit: Payer: Self-pay | Admitting: Certified Nurse Midwife

## 2015-07-29 ENCOUNTER — Ambulatory Visit (HOSPITAL_COMMUNITY)
Admission: RE | Admit: 2015-07-29 | Discharge: 2015-07-29 | Disposition: A | Discharge status: Home / Self Care | Payer: Medicaid Other | Source: Ambulatory Visit | Attending: Certified Nurse Midwife | Admitting: Certified Nurse Midwife

## 2015-07-29 ENCOUNTER — Other Ambulatory Visit (HOSPITAL_COMMUNITY): Payer: Self-pay | Admitting: Maternal and Fetal Medicine

## 2015-07-29 ENCOUNTER — Encounter (HOSPITAL_COMMUNITY): Payer: Self-pay

## 2015-07-29 DIAGNOSIS — O2693 Pregnancy related conditions, unspecified, third trimester: Secondary | ICD-10-CM | POA: Diagnosis present

## 2015-07-29 DIAGNOSIS — O403XX0 Polyhydramnios, third trimester, not applicable or unspecified: Secondary | ICD-10-CM

## 2015-07-29 DIAGNOSIS — O34219 Maternal care for unspecified type scar from previous cesarean delivery: Secondary | ICD-10-CM

## 2015-07-29 DIAGNOSIS — O289 Unspecified abnormal findings on antenatal screening of mother: Secondary | ICD-10-CM | POA: Insufficient documentation

## 2015-07-29 DIAGNOSIS — Z3A35 35 weeks gestation of pregnancy: Secondary | ICD-10-CM

## 2015-08-04 ENCOUNTER — Ambulatory Visit (INDEPENDENT_AMBULATORY_CARE_PROVIDER_SITE_OTHER): Payer: Medicaid Other | Admitting: Certified Nurse Midwife

## 2015-08-04 VITALS — BP 102/71 | HR 76 | Temp 98.3°F

## 2015-08-04 DIAGNOSIS — O403XX1 Polyhydramnios, third trimester, fetus 1: Secondary | ICD-10-CM | POA: Diagnosis not present

## 2015-08-04 DIAGNOSIS — J069 Acute upper respiratory infection, unspecified: Secondary | ICD-10-CM

## 2015-08-04 DIAGNOSIS — Z3483 Encounter for supervision of other normal pregnancy, third trimester: Secondary | ICD-10-CM

## 2015-08-04 LAB — POCT URINALYSIS DIPSTICK
Bilirubin, UA: NEGATIVE
Blood, UA: NEGATIVE
Glucose, UA: NEGATIVE
Ketones, UA: NEGATIVE
Leukocytes, UA: NEGATIVE
Nitrite, UA: NEGATIVE
Spec Grav, UA: 1.02
Urobilinogen, UA: NEGATIVE
pH, UA: 6

## 2015-08-04 MED ORDER — BENZONATATE 100 MG PO CAPS: 200.0000 mg | ORAL_CAPSULE | Freq: Three times a day (TID) | ORAL | Status: DC | PRN

## 2015-08-04 MED ORDER — AMOXICILLIN-POT CLAVULANATE 875-125 MG PO TABS: 1.0000 | ORAL_TABLET | Freq: Two times a day (BID) | ORAL | Status: DC

## 2015-08-04 MED ORDER — PSEUDOEPHEDRINE-GUAIFENESIN ER 120-1200 MG PO TB12: 1.0000 | ORAL_TABLET | Freq: Two times a day (BID) | ORAL | Status: DC

## 2015-08-04 NOTE — Progress Notes (Signed)
Subjective:    Angela [name] is a 30 y.o. female being seen today for her obstetrical visit. She is at 9574w6d gestation. Patient reports backache, fatigue, headache, no bleeding, no contractions, no cramping, no leaking and URI symptoms with cough for a week, denies any fever. Fetal movement: normal.  Problem List Items Addressed This Visit    None    Visit Diagnoses    Supervision of other normal pregnancy, antepartum, third trimester    -  Primary    Relevant Orders    POCT urinalysis dipstick    Strep Gp B NAA    URI, acute        Relevant Medications    Pseudoephedrine-Guaifenesin [phone] MG TB12    benzonatate (TESSALON PERLES) 100 MG capsule    amoxicillin-clavulanate (AUGMENTIN) 875-125 MG tablet      Patient Active Problem List   Diagnosis Date Noted  . Hemorrhoidal skin tags 06/09/2015  . Sickle cell trait (HCC) 06/09/2015  . Abnormal maternal serum screening test 04/09/2015  . Supervision of normal intrauterine pregnancy in multigravida 02/23/2015   Objective:    BP 102/71 mmHg  Pulse 76  Temp(Src) 98.3 F (36.8 C)  LMP 11/26/2014 (Exact Date) FHT:  145 BPM  Uterine Size: size greater than dates  Presentation: unsure   NST: reactive, + accels, no decels, moderate variability, no contractions  Assessment:    Pregnancy @ 274w6d weeks   Polyhydramnios  Repeat C-section  reactive NST  URI  Plan:     labs reviewed, problem list updated Consent signed. GBS sent TDAP offered  Rhogam given for RH negative Pediatrician: discussed. Infant feeding: plans to breastfeed. Maternity leave: discussed. Cigarette smoking: never smoked. Orders Placed This Encounter  Procedures  . Strep Gp B NAA  . POCT urinalysis dipstick   Meds ordered this encounter  Medications  . Pseudoephedrine-Guaifenesin [phone] MG TB12    Sig: Take 1 tablet by mouth every 12 (twelve) hours.    Dispense:  28 each    Refill:  0  . benzonatate (TESSALON PERLES) 100 MG capsule    Sig:  Take 2 capsules (200 mg total) by mouth 3 (three) times daily as needed for cough.    Dispense:  20 capsule    Refill:  0  . amoxicillin-clavulanate (AUGMENTIN) 875-125 MG tablet    Sig: Take 1 tablet by mouth 2 (two) times daily.    Dispense:  14 tablet    Refill:  0   Follow up in 1 Week.

## 2015-08-04 NOTE — Addendum Note (Signed)
Addended by: Samantha CrimesENNEY, Cherrell Maybee ANNE on: 08/04/2015 02:10 PM   Modules accepted: Orders

## 2015-08-05 ENCOUNTER — Other Ambulatory Visit (HOSPITAL_COMMUNITY): Payer: Self-pay | Admitting: Maternal and Fetal Medicine

## 2015-08-05 ENCOUNTER — Encounter (HOSPITAL_COMMUNITY): Payer: Self-pay

## 2015-08-05 ENCOUNTER — Ambulatory Visit (HOSPITAL_COMMUNITY)
Admission: RE | Admit: 2015-08-05 | Discharge: 2015-08-05 | Disposition: A | Discharge status: Home / Self Care | Payer: Medicaid Other | Source: Ambulatory Visit | Attending: Certified Nurse Midwife | Admitting: Certified Nurse Midwife

## 2015-08-05 DIAGNOSIS — O2693 Pregnancy related conditions, unspecified, third trimester: Secondary | ICD-10-CM

## 2015-08-05 DIAGNOSIS — O403XX0 Polyhydramnios, third trimester, not applicable or unspecified: Secondary | ICD-10-CM | POA: Insufficient documentation

## 2015-08-05 DIAGNOSIS — O34219 Maternal care for unspecified type scar from previous cesarean delivery: Secondary | ICD-10-CM

## 2015-08-05 DIAGNOSIS — O283 Abnormal ultrasonic finding on antenatal screening of mother: Secondary | ICD-10-CM | POA: Insufficient documentation

## 2015-08-05 DIAGNOSIS — Z3A36 36 weeks gestation of pregnancy: Secondary | ICD-10-CM | POA: Insufficient documentation

## 2015-08-05 DIAGNOSIS — O289 Unspecified abnormal findings on antenatal screening of mother: Secondary | ICD-10-CM

## 2015-08-06 LAB — STREP GP B NAA: STREP GROUP B AG: NEGATIVE

## 2015-08-10 LAB — NUSWAB VAGINITIS PLUS (VG+)
CANDIDA ALBICANS, NAA: NEGATIVE
CANDIDA GLABRATA, NAA: NEGATIVE
Chlamydia trachomatis, NAA: NEGATIVE
Neisseria gonorrhoeae, NAA: NEGATIVE
Trich vag by NAA: NEGATIVE

## 2015-08-11 ENCOUNTER — Inpatient Hospital Stay (HOSPITAL_COMMUNITY)
Admission: AD | Admit: 2015-08-11 | Discharge: 2015-08-12 | DRG: 778 | Disposition: A | Discharge status: Home / Self Care | Payer: Medicaid Other | Source: Ambulatory Visit | Attending: Obstetrics | Admitting: Obstetrics

## 2015-08-11 ENCOUNTER — Ambulatory Visit (INDEPENDENT_AMBULATORY_CARE_PROVIDER_SITE_OTHER): Payer: Medicaid Other | Admitting: Certified Nurse Midwife

## 2015-08-11 ENCOUNTER — Encounter (HOSPITAL_COMMUNITY): Payer: Self-pay | Admitting: *Deleted

## 2015-08-11 VITALS — BP 104/70 | HR 76 | Wt 195.0 lb

## 2015-08-11 DIAGNOSIS — O403XX1 Polyhydramnios, third trimester, fetus 1: Secondary | ICD-10-CM | POA: Diagnosis not present

## 2015-08-11 DIAGNOSIS — O403XX0 Polyhydramnios, third trimester, not applicable or unspecified: Secondary | ICD-10-CM | POA: Diagnosis present

## 2015-08-11 DIAGNOSIS — O479 False labor, unspecified: Secondary | ICD-10-CM | POA: Diagnosis present

## 2015-08-11 DIAGNOSIS — O280 Abnormal hematological finding on antenatal screening of mother: Secondary | ICD-10-CM

## 2015-08-11 DIAGNOSIS — O4700 False labor before 37 completed weeks of gestation, unspecified trimester: Secondary | ICD-10-CM | POA: Diagnosis present

## 2015-08-11 DIAGNOSIS — Z3A37 37 weeks gestation of pregnancy: Secondary | ICD-10-CM | POA: Diagnosis not present

## 2015-08-11 DIAGNOSIS — O99013 Anemia complicating pregnancy, third trimester: Secondary | ICD-10-CM | POA: Diagnosis present

## 2015-08-11 DIAGNOSIS — O283 Abnormal ultrasonic finding on antenatal screening of mother: Secondary | ICD-10-CM | POA: Diagnosis present

## 2015-08-11 DIAGNOSIS — Z3483 Encounter for supervision of other normal pregnancy, third trimester: Secondary | ICD-10-CM

## 2015-08-11 DIAGNOSIS — D573 Sickle-cell trait: Secondary | ICD-10-CM | POA: Diagnosis present

## 2015-08-11 DIAGNOSIS — Z3A36 36 weeks gestation of pregnancy: Secondary | ICD-10-CM | POA: Diagnosis not present

## 2015-08-11 DIAGNOSIS — O34219 Maternal care for unspecified type scar from previous cesarean delivery: Secondary | ICD-10-CM | POA: Diagnosis not present

## 2015-08-11 LAB — POCT URINALYSIS DIPSTICK
BILIRUBIN UA: NEGATIVE
Glucose, UA: NEGATIVE
KETONES UA: NEGATIVE
Leukocytes, UA: NEGATIVE
Nitrite, UA: NEGATIVE
PH UA: 7
PROTEIN UA: NEGATIVE
RBC UA: NEGATIVE
SPEC GRAV UA: 1.01
Urobilinogen, UA: NEGATIVE

## 2015-08-11 LAB — URINALYSIS, ROUTINE W REFLEX MICROSCOPIC
Bilirubin Urine: NEGATIVE
Bilirubin Urine: NEGATIVE
GLUCOSE, UA: NEGATIVE mg/dL
GLUCOSE, UA: NEGATIVE mg/dL
HGB URINE DIPSTICK: NEGATIVE
Hgb urine dipstick: NEGATIVE
KETONES UR: NEGATIVE mg/dL
Ketones, ur: NEGATIVE mg/dL
LEUKOCYTES UA: NEGATIVE
Leukocytes, UA: NEGATIVE
Nitrite: NEGATIVE
Nitrite: NEGATIVE
PH: 7 (ref 5.0–8.0)
PROTEIN: NEGATIVE mg/dL
PROTEIN: NEGATIVE mg/dL
Specific Gravity, Urine: 1.01 (ref 1.005–1.030)
Specific Gravity, Urine: <1.005 — ABNORMAL LOW (ref 1.005–1.030)
pH: 7 (ref 5.0–8.0)

## 2015-08-11 LAB — TYPE AND SCREEN
ABO/RH(D): A POS
Antibody Screen: NEGATIVE

## 2015-08-11 MED ORDER — OXYCODONE-ACETAMINOPHEN 5-325 MG PO TABS
2.0000 | ORAL_TABLET | ORAL | Status: DC | PRN
  Administered 2015-08-11 – 2015-08-12 (×3): 2 via ORAL
  Filled 2015-08-11 (×3): qty 2

## 2015-08-11 MED ORDER — ZOLPIDEM TARTRATE 5 MG PO TABS: 5.0000 mg | ORAL_TABLET | Freq: Every evening | ORAL | Status: DC | PRN

## 2015-08-11 MED ORDER — CALCIUM CARBONATE ANTACID 500 MG PO CHEW: 2.0000 | CHEWABLE_TABLET | ORAL | Status: DC | PRN

## 2015-08-11 MED ORDER — ACETAMINOPHEN 325 MG PO TABS: 650.0000 mg | ORAL_TABLET | ORAL | Status: DC | PRN

## 2015-08-11 MED ORDER — LACTATED RINGERS IV SOLN
INTRAVENOUS | Status: DC
  Administered 2015-08-11 – 2015-08-12 (×4): via INTRAVENOUS

## 2015-08-11 MED ORDER — LACTATED RINGERS IV BOLUS (SEPSIS)
1000.0000 mL | Freq: Once | INTRAVENOUS | Status: AC
  Administered 2015-08-11: 1000 mL via INTRAVENOUS

## 2015-08-11 MED ORDER — PROMETHAZINE HCL 25 MG/ML IJ SOLN
25.0000 mg | Freq: Once | INTRAMUSCULAR | Status: AC
  Administered 2015-08-11: 25 mg via INTRAMUSCULAR
  Filled 2015-08-11: qty 1

## 2015-08-11 MED ORDER — PRENATAL MULTIVITAMIN CH
1.0000 | ORAL_TABLET | Freq: Every day | ORAL | Status: DC
  Administered 2015-08-12: 1 via ORAL
  Filled 2015-08-11: qty 1

## 2015-08-11 MED ORDER — MORPHINE SULFATE (PF) 10 MG/ML IV SOLN
10.0000 mg | Freq: Once | INTRAVENOUS | Status: AC
  Administered 2015-08-11: 10 mg via INTRAMUSCULAR
  Filled 2015-08-11: qty 1

## 2015-08-11 MED ORDER — DOCUSATE SODIUM 100 MG PO CAPS
100.0000 mg | ORAL_CAPSULE | Freq: Every day | ORAL | Status: DC
  Administered 2015-08-12: 100 mg via ORAL
  Filled 2015-08-11 (×3): qty 1

## 2015-08-11 NOTE — H&P (Signed)
Angela [name] is a 30 y.o. female presenting for UC's and backache, not in labor. Maternal Medical History:  Reason for admission: Contractions.   Contractions: Frequency: regular.   Perceived severity is mild.    Fetal activity: Perceived fetal activity is normal.   Last perceived fetal movement was within the past hour.    Prenatal complications: Placental abnormality.   Single umbilical artery  Prenatal Complications - Diabetes: none.    OB History    Gravida Para Term Preterm AB TAB SAB Ectopic Multiple Living   7 3 3  0 3 0 3 0 0 3     Past Medical History  Diagnosis Date  . Anemia   . No pertinent past medical history   . Anginal pain (HCC)     within the past week Dr. Clearance CootsHarper heart burn  . Headache(784.0)   . Sickle cell anemia [facility](HCC)    Past Surgical History  Procedure Laterality Date  . Cesarean section    . Cesarean section  05/27/2012    Procedure: CESAREAN SECTION;  Surgeon: Antionette CharLisa Jackson-Moore, MD;  Location: [facility];  Service: Obstetrics;  Laterality: N/A;  Repeat cesarean section with delivery of baby boy at [id number].    Family History: family history includes Cancer in her father. There is no history of Other. Social History:  reports that she has never smoked. She has never used smokeless tobacco. She reports that she does not drink alcohol or use illicit drugs.   Prenatal Transfer Tool  Maternal Diabetes: No Genetic Screening: Abnormal:  Results: Elevated risk of Trisomy 18.  Panorama low risk. Maternal Ultrasounds/Referrals: Abnormal:  Findings:   Other: Single umbilical artery Fetal Ultrasounds or other Referrals:  Referred to Materal Fetal Medicine  Maternal Substance Abuse:  No Significant Maternal Medications:  None Significant Maternal Lab Results:  None Other Comments:  None  Review of Systems  Musculoskeletal: Positive for back pain.  All other systems reviewed and are negative.     Blood pressure 113/67, pulse 70, temperature 98.5 F (36.9  C), temperature source Oral, resp. rate 18, height 5\' 2"  (1.575 m), last menstrual period 11/26/2014. Maternal Exam:  Uterine Assessment: Contraction strength is mild.  Contraction frequency is regular.   Abdomen: Patient reports no abdominal tenderness. Fetal presentation: vertex  Introitus: Normal vulva. Normal vagina.  Cervix: Cervix evaluated by digital exam.     Physical Exam  Nursing note and vitals reviewed. Constitutional: She is oriented to person, place, and time. She appears well-developed and well-nourished.  HENT:  Head: Normocephalic and atraumatic.  Eyes: Conjunctivae are normal. Pupils are equal, round, and reactive to light.  Neck: Normal range of motion. Neck supple.  Cardiovascular: Normal rate and regular rhythm.   Respiratory: Effort normal and breath sounds normal.  GI: Soft. Bowel sounds are normal.  Genitourinary: Vagina normal and uterus normal.  Musculoskeletal: Normal range of motion.  Neurological: She is alert and oriented to person, place, and time.  Skin: Skin is warm and dry.  Psychiatric: She has a normal mood and affect. Her behavior is normal. Judgment and thought content normal.    Prenatal labs: ABO, Rh: A/POS/-- (10/04 1401) Antibody: NEG (10/04 1401) Rubella: 2.48 (10/04 1401) RPR: NON REAC (01/18 1142)  HBsAg: NEGATIVE (10/04 1401)  HIV: NONREACTIVE (01/18 1142)  GBS: Negative (03/15 1454)   Assessment/Plan: 36.6 weeks.  UC's  Not in labor.  Previous C/S x 2.  Admit for observation, R/O labor.   Braden Cimo A 08/11/2015, 2:27 PM

## 2015-08-11 NOTE — MAU Note (Addendum)
Pt sent from MD office for contractions.  Pt states uc's are stronger today, SVE 1 cm.  Denies bleeding or LOF.  C/O urinary frequency.

## 2015-08-11 NOTE — Progress Notes (Signed)
Subjective:    Angela [name] is a 30 y.o. female being seen today for her obstetrical visit. She is at 4238w6d gestation. Patient reports backache, contractions since yesterday, no bleeding and no leaking. Fetal movement: normal.  Discussed dental pain: list of dental providers given.  Denies any fever, sinus problems, congestion, or coughing.    Problem List Items Addressed This Visit    None    Visit Diagnoses    Encounter for supervision of other normal pregnancy in third trimester    -  Primary    Relevant Orders    POCT urinalysis dipstick      Patient Active Problem List   Diagnosis Date Noted  . Hemorrhoidal skin tags 06/09/2015  . Sickle cell trait (HCC) 06/09/2015  . Abnormal maternal serum screening test 04/09/2015  . Supervision of normal intrauterine pregnancy in multigravida 02/23/2015   Objective:    BP 104/70 mmHg  Pulse 76  Wt 195 lb (88.451 kg)  LMP 11/26/2014 (Exact Date) FHT:  150 BPM  Uterine Size: unable to determine d/t body habitus  Presentation: unsure   Cervix: 1 cm, posterior, soft, long.    NST: reactive, + accels, no decels, moderate variability.  + contractions about every 6 minutes, patient does feel them.    Assessment:    Pregnancy @ 1838w6d weeks   Contractions on NST Repeat C-section  Plan:     labs reviewed, problem list updated Consent signed. GBS results reviewed, negative.   TDAP offered  Rhogam given for RH negative Pediatrician: discussed. Infant feeding: plans to breastfeed. Maternity leave: discussed. Cigarette smoking: never smoked. Orders Placed This Encounter  Procedures  . POCT urinalysis dipstick   No orders of the defined types were placed in this encounter.   Follow up in 1 Week with NST.

## 2015-08-12 ENCOUNTER — Ambulatory Visit (HOSPITAL_COMMUNITY)
Admission: RE | Admit: 2015-08-12 | Discharge: 2015-08-12 | Disposition: A | Discharge status: Home / Self Care | Payer: Medicaid Other | Source: Ambulatory Visit | Attending: Certified Nurse Midwife | Admitting: Certified Nurse Midwife

## 2015-08-12 DIAGNOSIS — O283 Abnormal ultrasonic finding on antenatal screening of mother: Secondary | ICD-10-CM | POA: Insufficient documentation

## 2015-08-12 DIAGNOSIS — O2693 Pregnancy related conditions, unspecified, third trimester: Secondary | ICD-10-CM

## 2015-08-12 DIAGNOSIS — O289 Unspecified abnormal findings on antenatal screening of mother: Secondary | ICD-10-CM

## 2015-08-12 DIAGNOSIS — Z3A36 36 weeks gestation of pregnancy: Secondary | ICD-10-CM

## 2015-08-12 DIAGNOSIS — O34219 Maternal care for unspecified type scar from previous cesarean delivery: Secondary | ICD-10-CM

## 2015-08-12 DIAGNOSIS — O403XX0 Polyhydramnios, third trimester, not applicable or unspecified: Secondary | ICD-10-CM

## 2015-08-12 DIAGNOSIS — Z3A37 37 weeks gestation of pregnancy: Secondary | ICD-10-CM

## 2015-08-12 MED ORDER — ONDANSETRON HCL 4 MG/2ML IJ SOLN
4.0000 mg | Freq: Four times a day (QID) | INTRAMUSCULAR | Status: DC | PRN
  Administered 2015-08-12: 4 mg via INTRAVENOUS
  Filled 2015-08-12: qty 2

## 2015-08-12 MED ORDER — CALCIUM CARBONATE ANTACID 500 MG PO CHEW: 2.0000 | CHEWABLE_TABLET | Freq: Three times a day (TID) | ORAL | Status: DC

## 2015-08-12 MED ORDER — OXYCODONE-ACETAMINOPHEN 5-325 MG PO TABS: 2.0000 | ORAL_TABLET | ORAL | Status: DC | PRN

## 2015-08-12 NOTE — Discharge Summary (Signed)
Physician Discharge Summary  Patient ID: Angela [name] MRN: [id number] DOB/AGE: 32/05/1985 31 y.o.  Admit date: 08/11/2015 Discharge date: 08/12/2015  Admission Diagnoses:  Contractions, Polyhydramnios  Discharge Diagnoses: Contractions, Polyhydramnios Active Problems:   Preterm contractions   Discharged Condition: good  Hospital Course: Uneventful  Consults: MFM  Significant Diagnostic Studies: Ultrasound  Treatments: IV hydration  Discharge Exam: Blood pressure 105/60, pulse 62, temperature 97.4 F (36.3 C), temperature source Axillary, resp. rate 16, height 5\' 2"  (1.575 m), weight 195 lb (88.451 kg), last menstrual period 11/26/2014, SpO2 99 %. General appearance: alert, cooperative and no distress Resp: clear to auscultation bilaterally Cardio: regular rate and rhythm, S1, S2 normal, no murmur, click, rub or gallop GI: gravid  Disposition: 01-Home or Self Care     Medication List    STOP taking these medications        benzonatate 100 MG capsule  Commonly known as:  TESSALON PERLES      TAKE these medications        butalbital-acetaminophen-caffeine 50-325-40 MG tablet  Commonly known as:  FIORICET  Take 1-2 tablets by mouth every 6 (six) hours as needed for headache.     calcium carbonate 500 MG chewable tablet  Commonly known as:  TUMS - dosed in mg elemental calcium  Chew 2 tablets (400 mg of elemental calcium total) by mouth 3 (three) times daily with meals.     docusate sodium 100 MG capsule  Commonly known as:  COLACE  Take 100 mg by mouth 2 (two) times daily.     oxyCODONE-acetaminophen 5-325 MG tablet  Commonly known as:  PERCOCET/ROXICET  Take 2 tablets by mouth every 4 (four) hours as needed for moderate pain.     VITAFOL GUMMIES 3.33-0.333-34.8 MG Chew  Chew 3 tablets by mouth daily.         Signed: Roe Coombsachelle A Khyree Carillo, CNM 08/12/2015, 6:36 PM

## 2015-08-12 NOTE — Discharge Instructions (Signed)
Fetal Movement Counts °Angela Name: __________________________________________________ Angela [name]: ____________________ °Performing a fetal movement count is highly recommended in high-risk pregnancies, but it is good for every pregnant woman to do. Your health care provider may ask you to start counting fetal movements at 28 weeks of the pregnancy. Fetal movements often increase: °· After eating a full meal. °· After physical activity. °· After eating or drinking something sweet or cold. °· At rest. °Pay attention to when you feel the baby is most active. This will help you notice a pattern of your baby's sleep and wake cycles and what factors contribute to an increase in fetal movement. It is important to perform a fetal movement count at the same time each day when your baby is normally most active.  °HOW TO COUNT FETAL MOVEMENTS °· Find a quiet and comfortable area to sit or lie down on your left side. Lying on your left side provides the best blood and oxygen circulation to your baby. °· Write down the day and time on a sheet of paper or in a journal. °· Start counting kicks, flutters, swishes, rolls, or jabs in a 2-hour period. You should feel at least 10 movements within 2 hours. °· If you do not feel 10 movements in 2 hours, wait 2-3 hours and count again. Look for a change in the pattern or not enough counts in 2 hours. °SEEK MEDICAL CARE IF: °· You feel less than 10 counts in 2 hours, tried twice. °· There is no movement in over an hour. °· The pattern is changing or taking longer each day to reach 10 counts in 2 hours. °· You feel the baby is not moving as he or she usually does. °[name]: ____________ Movements: ____________ Start time: ____________ Finish time: ____________  °[name]: ____________ Movements: ____________ Start time: ____________ Finish time: ____________ °[name]: ____________ Movements: ____________ Start time: ____________ Finish time: ____________ °[name]: ____________ Movements:  ____________ Start time: ____________ Finish time: ____________ °[name]: ____________ Movements: ____________ Start time: ____________ Finish time: ____________ °[name]: ____________ Movements: ____________ Start time: ____________ Finish time: ____________ °[name]: ____________ Movements: ____________ Start time: ____________ Finish time: ____________ °[name]: ____________ Movements: ____________ Start time: ____________ Finish time: ____________  °[name]: ____________ Movements: ____________ Start time: ____________ Finish time: ____________ °[name]: ____________ Movements: ____________ Start time: ____________ Finish time: ____________ °[name]: ____________ Movements: ____________ Start time: ____________ Finish time: ____________ °[name]: ____________ Movements: ____________ Start time: ____________ Finish time: ____________ °[name]: ____________ Movements: ____________ Start time: ____________ Finish time: ____________ °[name]: ____________ Movements: ____________ Start time: ____________ Finish time: ____________ °[name]: ____________ Movements: ____________ Start time: ____________ Finish time: ____________  °[name]: ____________ Movements: ____________ Start time: ____________ Finish time: ____________ °[name]: ____________ Movements: ____________ Start time: ____________ Finish time: ____________ °[name]: ____________ Movements: ____________ Start time: ____________ Finish time: ____________ °[name]: ____________ Movements: ____________ Start time: ____________ Finish time: ____________ °[name]: ____________ Movements: ____________ Start time: ____________ Finish time: ____________ °[name]: ____________ Movements: ____________ Start time: ____________ Finish time: ____________ °[name]: ____________ Movements: ____________ Start time: ____________ Finish time: ____________  °[name]: ____________ Movements: ____________ Start time: ____________ Finish time: ____________ °[name]: ____________ Movements: ____________ Start time: ____________ Finish  time: ____________ °[name]: ____________ Movements: ____________ Start time: ____________ Finish time: ____________ °[name]: ____________ Movements: ____________ Start time: ____________ Finish time: ____________ °[name]: ____________ Movements: ____________ Start time: ____________ Finish time: ____________ °[name]: ____________ Movements: ____________ Start time: ____________ Finish time: ____________ °[name]: ____________ Movements: ____________ Start time: ____________ Finish time: ____________  °[name]: ____________ Movements: ____________ Start time: ____________ Finish   time: ____________ [name]: ____________ Movements: ____________ Start time: ____________ Angela [name] time: ____________ [name]: ____________ Movements: ____________ Start time: ____________ Angela [name] time: ____________ [name]: ____________ Movements: ____________ Start time: ____________ Angela [name] time: ____________ [name]: ____________ Movements: ____________ Start time: ____________ Angela [name] time: ____________ [name]: ____________ Movements: ____________ Start time: ____________ Angela [name] time: ____________ [name]: ____________ Movements: ____________ Start time: ____________ Angela [name] time: ____________  [name]: ____________ Movements: ____________ Start time: ____________ Angela [name] time: ____________ [name]: ____________ Movements: ____________ Start time: ____________ Angela [name] time: ____________ [name]: ____________ Movements: ____________ Start time: ____________ Angela [name] time: ____________ [name]: ____________ Movements: ____________ Start time: ____________ Angela [name] time: ____________ [name]: ____________ Movements: ____________ Start time: ____________ Angela [name] time: ____________ [name]: ____________ Movements: ____________ Start time: ____________ Angela [name] time: ____________ [name]: ____________ Movements: ____________ Start time: ____________ Angela [name] time: ____________  [name]: ____________ Movements: ____________ Start time: ____________ Angela [name] time: ____________ [name]: ____________  Movements: ____________ Start time: ____________ Angela [name] time: ____________ [name]: ____________ Movements: ____________ Start time: ____________ Angela [name] time: ____________ [name]: ____________ Movements: ____________ Start time: ____________ Angela [name] time: ____________ [name]: ____________ Movements: ____________ Start time: ____________ Angela [name] time: ____________ [name]: ____________ Movements: ____________ Start time: ____________ Angela [name] time: ____________ [name]: ____________ Movements: ____________ Start time: ____________ Angela [name] time: ____________  [name]: ____________ Movements: ____________ Start time: ____________ Angela [name] time: ____________ [name]: ____________ Movements: ____________ Start time: ____________ Angela [name] time: ____________ [name]: ____________ Movements: ____________ Start time: ____________ Angela [name] time: ____________ [name]: ____________ Movements: ____________ Start time: ____________ Angela [name] time: ____________ [name]: ____________ Movements: ____________ Start time: ____________ Angela [name] time: ____________ [name]: ____________ Movements: ____________ Start time: ____________ Angela [name] time: ____________   This information is not intended to replace advice given to you by your health care provider. Make sure you discuss any questions you have with your health care provider.   Document Released: 06/07/2006 Document Revised: 05/29/2014 Document Reviewed: 03/04/2012 [employer]   Polyhydramnios When a woman becomes pregnant, a sac is formed around the fertilized egg (embryo) and later the growing baby (fetus). This sac is called the amniotic sac. The amniotic sac is filled with fluid. It gets bigger as the pregnancy grows. When there is too much fluid in the sac, it is called polyhydramnios. All babies born with polyhydramnios should be looked at for congenital abnormalities. The amniotic fluid cushions and protects the baby. It also provides the baby with fluids  and is crucial to normal development. Your baby breathes this fluid into its lungs and swallows it. This helps promote the healthy growth of the lungs and gastrointestinal tract. Amniotic fluid also helps the baby move around, helping with the normal development of muscle and bone.  CAUSES   Diabetes mellitus.  Downs Syndrome, fetal abnormalities of the intestinal tract and anencephaly (the fetus has no brain) can prevent the fetus from swallowing the amniotic fluid.  One twins passes (transfuses) their blood into the other twin (twin-twin transfusion syndrome).  Medical illness of the mother or heart.  Kidney disease.  Tumor (chorioangioma) of the placenta. SYMPTOMS   The uterus enlarges beyond the size it should be for that particular time of the pregnancy.  The mother may feel more pressure and discomfort than should be expected.  The mother may notice a quick and unexpected enlargement of her stomach. DIAGNOSIS   Your health care provider notices your uterus is beyond the size that is consistent with the time of the pregnancy when he or she measures you.  An ultrasound is then used (abdominally or vaginally) to see if there are twins or more, measure  the growth of the baby, looks for birth defects and measures the amount of fluid in the amniotic sac.  Amniotic Fluid Index (AFI). AFI measures the amount of fluid in the amniotic sac in four different areas. If there is more than 24 centimeters, you have polyhydramnios. TREATMENT   Removing some fluid from the amniotic sac.  Take medications that lower the fluids in your body.  Stop using salt or salty foods because it causes you to keep fluid in your body (retention).  If your health care provider thinks you have polyhydramnios, you will likely need more testing. You will be watched for the rest of your pregnancy. HOME CARE INSTRUCTIONS   Keep all your prenatal appointments. Follow your health care provider's  recommendations.  Do not eat a lot of salt and salty foods.  If you have diabetes, keep in it control.  If you have heart or kidney disease, treat the disease as advised by your health care provider. SEEK MEDICAL CARE IF:   You think your uterus has grown too fast in a short period of time.  You feel a great amount of pressure in your lower belly (pelvis) and are more uncomfortable than expected. SEEK IMMEDIATE MEDICAL CARE IF:   You have a gush of fluid or are leaking fluid from your vagina.  You stop feeling the baby move.  You do not feel the baby kicking as much as usual.  You have a hard time keeping your diabetes under control.  You are having problems with your heart or kidney disease.   This information is not intended to replace advice given to you by your health care provider. Make sure you discuss any questions you have with your health care provider.   Document Released: 07/29/2002 Document Revised: 02/26/2013 Document Reviewed: 12/05/2012 [employer]

## 2015-08-12 NOTE — Progress Notes (Signed)
Patient ID: [name], female   DOB: 08/19/1985, 30 y.o.   MRN: [id number] Hospital Day: 2  S: Preterm labor symptoms: low back pain and cramping  O: Blood pressure 116/73, pulse 61, temperature 98.5 F (36.9 C), temperature source Oral, resp. rate 18, height 5\' 2"  (1.575 m), weight 195 lb (88.451 kg), last menstrual period 11/26/2014, SpO2 99 %.   WUJ:[id number]:Baseline: 150 bpm Toco: Occasional mild UC SVE:   A/P- 30 y.o. admitted with: UC's, mild and no cervical change  Present on Admission:  . Preterm contractions  Pregnancy Complications: Preterm UC's  Preterm labor management: IVF's and observation Dating:  5627w0d PNL Needed:  none FWB:  good PTL:  none ROD: C-section without labor

## 2015-08-16 ENCOUNTER — Other Ambulatory Visit: Payer: Self-pay | Admitting: Certified Nurse Midwife

## 2015-08-18 ENCOUNTER — Telehealth (HOSPITAL_COMMUNITY): Payer: Self-pay | Admitting: *Deleted

## 2015-08-18 ENCOUNTER — Ambulatory Visit (INDEPENDENT_AMBULATORY_CARE_PROVIDER_SITE_OTHER): Payer: Medicaid Other | Admitting: Certified Nurse Midwife

## 2015-08-18 VITALS — BP 101/68 | HR 76 | Temp 98.4°F | Wt 196.0 lb

## 2015-08-18 DIAGNOSIS — O403XX1 Polyhydramnios, third trimester, fetus 1: Secondary | ICD-10-CM

## 2015-08-18 DIAGNOSIS — Z3483 Encounter for supervision of other normal pregnancy, third trimester: Secondary | ICD-10-CM

## 2015-08-18 LAB — POCT URINALYSIS DIPSTICK
BILIRUBIN UA: NEGATIVE
Blood, UA: NEGATIVE
GLUCOSE UA: NEGATIVE
Ketones, UA: NEGATIVE
LEUKOCYTES UA: NEGATIVE
NITRITE UA: NEGATIVE
Protein, UA: NEGATIVE
Spec Grav, UA: 1.01
UROBILINOGEN UA: NEGATIVE
pH, UA: 7

## 2015-08-18 NOTE — Telephone Encounter (Signed)
Preadmission screen  

## 2015-08-18 NOTE — Progress Notes (Signed)
Subjective:    Angela [name] is a 30 y.o. female being seen today for her obstetrical visit. She is at 9078w6d gestation. Patient reports no bleeding, no leaking and occasional contractions. Fetal movement: normal.  Problem List Items Addressed This Visit    None    Visit Diagnoses    Supervision of other normal pregnancy, antepartum, third trimester    -  Primary    Relevant Orders    POCT urinalysis dipstick      Patient Active Problem List   Diagnosis Date Noted  . Preterm contractions 08/11/2015  . Hemorrhoidal skin tags 06/09/2015  . Sickle cell trait (HCC) 06/09/2015  . Abnormal maternal serum screening test 04/09/2015  . Supervision of normal intrauterine pregnancy in multigravida 02/23/2015    Objective:    BP 101/68 mmHg  Pulse 76  Temp(Src) 98.4 F (36.9 C)  Wt 196 lb (88.905 kg)  LMP 11/26/2014 (Exact Date) FHT: 145 BPM  Uterine Size: size greater than dates  Presentations: cephalic  Pelvic Exam: deferred     Assessment:    Pregnancy @ 7178w6d weeks   Repeat C-section  polyhydramnios  Plan:   Plans for delivery: C/Section scheduled; labs reviewed; problem list updated Counseling: Consent signed. Infant feeding: plans to breastfeed. Cigarette smoking: never smoked. L&D discussion: symptoms of labor, discussed when to call, discussed what number to call, anesthetic/analgesic options reviewed and delivering clinician:  plans Physician. Postpartum supports and preparation: circumcision discussed and contraception plans discussed.  Follow up in 1 Week with NST.

## 2015-08-19 ENCOUNTER — Encounter (HOSPITAL_COMMUNITY): Payer: Self-pay

## 2015-08-19 ENCOUNTER — Ambulatory Visit (HOSPITAL_COMMUNITY)
Admission: RE | Admit: 2015-08-19 | Discharge: 2015-08-19 | Disposition: A | Discharge status: Home / Self Care | Payer: Medicaid Other | Source: Ambulatory Visit | Attending: Certified Nurse Midwife | Admitting: Certified Nurse Midwife

## 2015-08-19 VITALS — BP 123/60 | HR 71 | Wt 196.0 lb

## 2015-08-19 DIAGNOSIS — O280 Abnormal hematological finding on antenatal screening of mother: Secondary | ICD-10-CM

## 2015-08-19 DIAGNOSIS — Z3A38 38 weeks gestation of pregnancy: Secondary | ICD-10-CM | POA: Insufficient documentation

## 2015-08-19 DIAGNOSIS — O403XX0 Polyhydramnios, third trimester, not applicable or unspecified: Secondary | ICD-10-CM

## 2015-08-19 DIAGNOSIS — O283 Abnormal ultrasonic finding on antenatal screening of mother: Secondary | ICD-10-CM | POA: Diagnosis not present

## 2015-08-19 DIAGNOSIS — O34219 Maternal care for unspecified type scar from previous cesarean delivery: Secondary | ICD-10-CM | POA: Insufficient documentation

## 2015-08-20 ENCOUNTER — Encounter (HOSPITAL_COMMUNITY): Payer: Self-pay | Admitting: *Deleted

## 2015-08-20 NOTE — Telephone Encounter (Signed)
Preadmission screen  

## 2015-08-25 ENCOUNTER — Ambulatory Visit (INDEPENDENT_AMBULATORY_CARE_PROVIDER_SITE_OTHER): Payer: Medicaid Other | Admitting: Certified Nurse Midwife

## 2015-08-25 VITALS — BP 107/74 | HR 67 | Wt 193.0 lb

## 2015-08-25 DIAGNOSIS — Z3483 Encounter for supervision of other normal pregnancy, third trimester: Secondary | ICD-10-CM

## 2015-08-25 DIAGNOSIS — O9989 Other specified diseases and conditions complicating pregnancy, childbirth and the puerperium: Secondary | ICD-10-CM

## 2015-08-25 LAB — POCT URINALYSIS DIPSTICK
Bilirubin, UA: NEGATIVE
GLUCOSE UA: NEGATIVE
Ketones, UA: NEGATIVE
Leukocytes, UA: NEGATIVE
NITRITE UA: NEGATIVE
PROTEIN UA: NEGATIVE
RBC UA: NEGATIVE
Spec Grav, UA: <=1.005
UROBILINOGEN UA: NEGATIVE
pH, UA: 7

## 2015-08-25 NOTE — Progress Notes (Signed)
Subjective:    Angela [name] is a 30 y.o. female being seen today for her obstetrical visit. She is at 2143w6d gestation. Patient reports no bleeding, no leaking and occasional contractions. Fetal movement: normal.  Problem List Items Addressed This Visit    None    Visit Diagnoses    Encounter for supervision of other normal pregnancy in third trimester    -  Primary    Relevant Orders    POCT urinalysis dipstick (Completed)      Patient Active Problem List   Diagnosis Date Noted  . Preterm contractions 08/11/2015  . Hemorrhoidal skin tags 06/09/2015  . Sickle cell trait (HCC) 06/09/2015  . Abnormal maternal serum screening test 04/09/2015  . Supervision of normal intrauterine pregnancy in multigravida 02/23/2015    Objective:    BP 107/74 mmHg  Pulse 67  Wt 193 lb (87.544 kg)  LMP 11/26/2014 (Exact Date) FHT: 145 BPM  Uterine Size: size greater than dates  Presentations: cephalic  Pelvic Exam:              Dilation: Closed       Effacement: Long             Station:  -3    Consistency: soft            Position: posterior   NST: +accels, no decels, moderate variability, contractions every 6-10 minutes.  Cat. 1 tracing. Contractions discussed with Dr. Clearance CootsHarper.  Keep C-Section as scheduled.    Assessment:    Pregnancy @ 6943w6d weeks   Polyhydramnios this pregnancy  NST reactive Plan:   Plans for delivery: C/Section scheduled; labs reviewed; problem list updated Counseling: Consent signed. Infant feeding: plans to breastfeed. Cigarette smoking: never smoked. L&D discussion: symptoms of labor, discussed when to call, discussed what number to call, anesthetic/analgesic options reviewed and delivering clinician:  plans Physician. Postpartum supports and preparation: circumcision discussed and contraception plans discussed.  Follow up in 1 Week postpartum.

## 2015-08-25 NOTE — Progress Notes (Signed)
Pt states she was told her fluid levels are normal after last u/s.

## 2015-08-26 ENCOUNTER — Encounter (HOSPITAL_COMMUNITY)
Admission: RE | Admit: 2015-08-26 | Discharge: 2015-08-26 | Disposition: A | Discharge status: Home / Self Care | Payer: Medicaid Other | Source: Ambulatory Visit | Attending: Obstetrics | Admitting: Obstetrics

## 2015-08-26 LAB — CBC
HCT: 37.2 % (ref 36.0–46.0)
Hemoglobin: 12.9 g/dL (ref 12.0–15.0)
MCH: 28.7 pg (ref 26.0–34.0)
MCHC: 34.7 g/dL (ref 30.0–36.0)
MCV: 82.7 fL (ref 78.0–100.0)
Platelets: 234 10*3/uL (ref 150–400)
RBC: 4.5 MIL/uL (ref 3.87–5.11)
RDW: 15.9 % — AB (ref 11.5–15.5)
WBC: 8.5 10*3/uL (ref 4.0–10.5)

## 2015-08-27 ENCOUNTER — Encounter (HOSPITAL_COMMUNITY): Payer: Self-pay | Admitting: *Deleted

## 2015-08-27 ENCOUNTER — Inpatient Hospital Stay (HOSPITAL_COMMUNITY)
Admission: RE | Admit: 2015-08-27 | Discharge: 2015-08-30 | DRG: 765 | Disposition: A | Discharge status: Home / Self Care | Payer: Medicaid Other | Source: Ambulatory Visit | Attending: Obstetrics | Admitting: Obstetrics

## 2015-08-27 ENCOUNTER — Inpatient Hospital Stay (HOSPITAL_COMMUNITY): Payer: Medicaid Other | Admitting: Anesthesiology

## 2015-08-27 ENCOUNTER — Encounter (HOSPITAL_COMMUNITY)
Admission: RE | Disposition: A | Discharge status: Home / Self Care | Payer: Self-pay | Source: Ambulatory Visit | Attending: Obstetrics

## 2015-08-27 ENCOUNTER — Other Ambulatory Visit: Payer: Self-pay | Admitting: *Deleted

## 2015-08-27 DIAGNOSIS — O403XX0 Polyhydramnios, third trimester, not applicable or unspecified: Secondary | ICD-10-CM | POA: Diagnosis present

## 2015-08-27 DIAGNOSIS — Z3A39 39 weeks gestation of pregnancy: Secondary | ICD-10-CM | POA: Diagnosis not present

## 2015-08-27 DIAGNOSIS — D573 Sickle-cell trait: Secondary | ICD-10-CM | POA: Diagnosis present

## 2015-08-27 DIAGNOSIS — O280 Abnormal hematological finding on antenatal screening of mother: Secondary | ICD-10-CM

## 2015-08-27 DIAGNOSIS — O9902 Anemia complicating childbirth: Secondary | ICD-10-CM | POA: Diagnosis present

## 2015-08-27 DIAGNOSIS — O34211 Maternal care for low transverse scar from previous cesarean delivery: Principal | ICD-10-CM | POA: Diagnosis present

## 2015-08-27 LAB — PREPARE RBC (CROSSMATCH)

## 2015-08-27 LAB — RPR: RPR: NONREACTIVE

## 2015-08-27 SURGERY — Surgical Case
Anesthesia: Spinal

## 2015-08-27 MED ORDER — DIPHENHYDRAMINE HCL 50 MG/ML IJ SOLN: 12.5000 mg | INTRAMUSCULAR | Status: DC | PRN

## 2015-08-27 MED ORDER — BUPIVACAINE IN DEXTROSE 0.75-8.25 % IT SOLN
INTRATHECAL | Status: DC | PRN
  Administered 2015-08-27: 1.6 mL via INTRATHECAL

## 2015-08-27 MED ORDER — ZOLPIDEM TARTRATE 5 MG PO TABS: 5.0000 mg | ORAL_TABLET | Freq: Every evening | ORAL | Status: DC | PRN

## 2015-08-27 MED ORDER — MORPHINE SULFATE (PF) 0.5 MG/ML IJ SOLN
INTRAMUSCULAR | Status: DC | PRN
  Administered 2015-08-27: .2 mg via EPIDURAL

## 2015-08-27 MED ORDER — NALBUPHINE HCL 10 MG/ML IJ SOLN: 5.0000 mg | INTRAMUSCULAR | Status: DC | PRN

## 2015-08-27 MED ORDER — MORPHINE SULFATE (PF) 0.5 MG/ML IJ SOLN
INTRAMUSCULAR | Status: AC
  Filled 2015-08-27: qty 10

## 2015-08-27 MED ORDER — SIMETHICONE 80 MG PO CHEW
80.0000 mg | CHEWABLE_TABLET | ORAL | Status: DC
  Administered 2015-08-27 – 2015-08-29 (×3): 80 mg via ORAL
  Filled 2015-08-27 (×3): qty 1

## 2015-08-27 MED ORDER — OXYTOCIN 10 UNIT/ML IJ SOLN
40.0000 [IU] | INTRAVENOUS | Status: DC | PRN
  Administered 2015-08-27: 40 [IU] via INTRAVENOUS

## 2015-08-27 MED ORDER — SIMETHICONE 80 MG PO CHEW
80.0000 mg | CHEWABLE_TABLET | ORAL | Status: DC | PRN
  Filled 2015-08-27: qty 1

## 2015-08-27 MED ORDER — LACTATED RINGERS IV SOLN
INTRAVENOUS | Status: DC
  Administered 2015-08-27: 13:00:00 via INTRAVENOUS

## 2015-08-27 MED ORDER — OXYTOCIN 10 UNIT/ML IJ SOLN: 2.5000 [IU]/h | INTRAMUSCULAR | Status: AC

## 2015-08-27 MED ORDER — NALBUPHINE HCL 10 MG/ML IJ SOLN: 5.0000 mg | Freq: Once | INTRAMUSCULAR | Status: DC | PRN

## 2015-08-27 MED ORDER — SODIUM CHLORIDE 0.9% FLUSH: 3.0000 mL | INTRAVENOUS | Status: DC | PRN

## 2015-08-27 MED ORDER — SIMETHICONE 80 MG PO CHEW
80.0000 mg | CHEWABLE_TABLET | Freq: Three times a day (TID) | ORAL | Status: DC
  Administered 2015-08-28 – 2015-08-30 (×6): 80 mg via ORAL
  Filled 2015-08-27 (×8): qty 1

## 2015-08-27 MED ORDER — LANOLIN HYDROUS EX OINT: 1.0000 "application " | TOPICAL_OINTMENT | CUTANEOUS | Status: DC | PRN

## 2015-08-27 MED ORDER — OXYCODONE-ACETAMINOPHEN 5-325 MG PO TABS
1.0000 | ORAL_TABLET | ORAL | Status: DC | PRN
  Administered 2015-08-28: 1 via ORAL
  Filled 2015-08-27 (×4): qty 1

## 2015-08-27 MED ORDER — LACTATED RINGERS IV SOLN
Freq: Once | INTRAVENOUS | Status: AC
  Administered 2015-08-27 (×2): via INTRAVENOUS

## 2015-08-27 MED ORDER — TETANUS-DIPHTH-ACELL PERTUSSIS 5-2.5-18.5 LF-MCG/0.5 IM SUSP: 0.5000 mL | Freq: Once | INTRAMUSCULAR | Status: DC

## 2015-08-27 MED ORDER — OXYTOCIN 10 UNIT/ML IJ SOLN
INTRAMUSCULAR | Status: AC
  Filled 2015-08-27: qty 4

## 2015-08-27 MED ORDER — SCOPOLAMINE 1 MG/3DAYS TD PT72
MEDICATED_PATCH | TRANSDERMAL | Status: AC
  Administered 2015-08-27: 1.5 mg via TRANSDERMAL
  Filled 2015-08-27: qty 1

## 2015-08-27 MED ORDER — SENNOSIDES-DOCUSATE SODIUM 8.6-50 MG PO TABS
2.0000 | ORAL_TABLET | ORAL | Status: DC
  Administered 2015-08-28 – 2015-08-29 (×3): 2 via ORAL
  Filled 2015-08-27 (×4): qty 2

## 2015-08-27 MED ORDER — PRENATAL MULTIVITAMIN CH
1.0000 | ORAL_TABLET | Freq: Every day | ORAL | Status: DC
  Administered 2015-08-28 – 2015-08-30 (×3): 1 via ORAL
  Filled 2015-08-27 (×3): qty 1

## 2015-08-27 MED ORDER — PHENYLEPHRINE 8 MG IN D5W 100 ML (0.08MG/ML) PREMIX OPTIME
INJECTION | INTRAVENOUS | Status: AC
  Filled 2015-08-27: qty 100

## 2015-08-27 MED ORDER — IBUPROFEN 600 MG PO TABS
600.0000 mg | ORAL_TABLET | Freq: Four times a day (QID) | ORAL | Status: DC
  Administered 2015-08-27 – 2015-08-30 (×11): 600 mg via ORAL
  Filled 2015-08-27 (×11): qty 1

## 2015-08-27 MED ORDER — CEFAZOLIN SODIUM-DEXTROSE 2-4 GM/100ML-% IV SOLN
2.0000 g | Freq: Once | INTRAVENOUS | Status: AC
  Administered 2015-08-27: 2 g via INTRAVENOUS
  Filled 2015-08-27: qty 100

## 2015-08-27 MED ORDER — MENTHOL 3 MG MT LOZG: 1.0000 | LOZENGE | OROMUCOSAL | Status: DC | PRN

## 2015-08-27 MED ORDER — ACETAMINOPHEN 325 MG PO TABS: 650.0000 mg | ORAL_TABLET | ORAL | Status: DC | PRN

## 2015-08-27 MED ORDER — MEPERIDINE HCL 25 MG/ML IJ SOLN: 6.2500 mg | INTRAMUSCULAR | Status: DC | PRN

## 2015-08-27 MED ORDER — SCOPOLAMINE 1 MG/3DAYS TD PT72: 1.0000 | MEDICATED_PATCH | Freq: Once | TRANSDERMAL | Status: DC

## 2015-08-27 MED ORDER — KETOROLAC TROMETHAMINE 30 MG/ML IJ SOLN
30.0000 mg | Freq: Four times a day (QID) | INTRAMUSCULAR | Status: AC | PRN
  Administered 2015-08-27: 30 mg via INTRAMUSCULAR

## 2015-08-27 MED ORDER — OXYCODONE-ACETAMINOPHEN 5-325 MG PO TABS
2.0000 | ORAL_TABLET | ORAL | Status: DC | PRN
  Administered 2015-08-28 – 2015-08-30 (×7): 2 via ORAL
  Filled 2015-08-27 (×6): qty 2

## 2015-08-27 MED ORDER — KETOROLAC TROMETHAMINE 30 MG/ML IJ SOLN
INTRAMUSCULAR | Status: AC
  Filled 2015-08-27: qty 1

## 2015-08-27 MED ORDER — PHENYLEPHRINE 8 MG IN D5W 100 ML (0.08MG/ML) PREMIX OPTIME
INJECTION | INTRAVENOUS | Status: DC | PRN
  Administered 2015-08-27: 60 ug/min via INTRAVENOUS

## 2015-08-27 MED ORDER — FENTANYL CITRATE (PF) 100 MCG/2ML IJ SOLN
INTRAMUSCULAR | Status: DC | PRN
  Administered 2015-08-27: 10 ug via INTRAVENOUS

## 2015-08-27 MED ORDER — DIBUCAINE 1 % RE OINT: 1.0000 "application " | TOPICAL_OINTMENT | RECTAL | Status: DC | PRN

## 2015-08-27 MED ORDER — NALOXONE HCL 2 MG/2ML IJ SOSY
1.0000 ug/kg/h | PREFILLED_SYRINGE | INTRAVENOUS | Status: DC | PRN
  Filled 2015-08-27: qty 2

## 2015-08-27 MED ORDER — KETOROLAC TROMETHAMINE 30 MG/ML IJ SOLN: 30.0000 mg | Freq: Four times a day (QID) | INTRAMUSCULAR | Status: AC | PRN

## 2015-08-27 MED ORDER — NALOXONE HCL 0.4 MG/ML IJ SOLN: 0.4000 mg | INTRAMUSCULAR | Status: DC | PRN

## 2015-08-27 MED ORDER — SCOPOLAMINE 1 MG/3DAYS TD PT72
1.0000 | MEDICATED_PATCH | Freq: Once | TRANSDERMAL | Status: DC
  Administered 2015-08-27: 1.5 mg via TRANSDERMAL

## 2015-08-27 MED ORDER — ONDANSETRON HCL 4 MG/2ML IJ SOLN: 4.0000 mg | Freq: Three times a day (TID) | INTRAMUSCULAR | Status: DC | PRN

## 2015-08-27 MED ORDER — ONDANSETRON HCL 4 MG/2ML IJ SOLN
INTRAMUSCULAR | Status: AC
  Filled 2015-08-27: qty 2

## 2015-08-27 MED ORDER — DIPHENHYDRAMINE HCL 25 MG PO CAPS
25.0000 mg | ORAL_CAPSULE | ORAL | Status: DC | PRN
  Filled 2015-08-27: qty 1

## 2015-08-27 MED ORDER — CEFAZOLIN SODIUM-DEXTROSE 2-3 GM-% IV SOLR
INTRAVENOUS | Status: AC
  Filled 2015-08-27: qty 50

## 2015-08-27 MED ORDER — WITCH HAZEL-GLYCERIN EX PADS: 1.0000 "application " | MEDICATED_PAD | CUTANEOUS | Status: DC | PRN

## 2015-08-27 MED ORDER — DIPHENHYDRAMINE HCL 25 MG PO CAPS: 25.0000 mg | ORAL_CAPSULE | Freq: Four times a day (QID) | ORAL | Status: DC | PRN

## 2015-08-27 MED ORDER — FENTANYL CITRATE (PF) 100 MCG/2ML IJ SOLN
INTRAMUSCULAR | Status: AC
  Filled 2015-08-27: qty 2

## 2015-08-27 MED ORDER — LACTATED RINGERS IV SOLN
INTRAVENOUS | Status: DC
  Administered 2015-08-28: 03:00:00 via INTRAVENOUS

## 2015-08-27 MED ORDER — ONDANSETRON HCL 4 MG/2ML IJ SOLN
INTRAMUSCULAR | Status: DC | PRN
  Administered 2015-08-27: 4 mg via INTRAVENOUS

## 2015-08-27 SURGICAL SUPPLY — 36 items
CHLORAPREP W/TINT 26ML (MISCELLANEOUS) ×3 IMPLANT
CLAMP CORD UMBIL (MISCELLANEOUS) IMPLANT
CLOTH BEACON ORANGE TIMEOUT ST (SAFETY) ×3 IMPLANT
CONTAINER PREFILL 10% NBF 15ML (MISCELLANEOUS) ×6 IMPLANT
DRSG OPSITE POSTOP 4X10 (GAUZE/BANDAGES/DRESSINGS) ×3 IMPLANT
ELECT REM PT RETURN 9FT ADLT (ELECTROSURGICAL) ×3
ELECTRODE REM PT RTRN 9FT ADLT (ELECTROSURGICAL) ×1 IMPLANT
EXTRACTOR VACUUM M CUP 4 TUBE (SUCTIONS) IMPLANT
EXTRACTOR VACUUM M CUP 4' TUBE (SUCTIONS)
GLOVE BIO SURGEON STRL SZ8 (GLOVE) ×3 IMPLANT
GLOVE BIOGEL PI IND STRL 7.0 (GLOVE) ×2 IMPLANT
GLOVE BIOGEL PI INDICATOR 7.0 (GLOVE) ×4
GOWN STRL REUS W/TWL LRG LVL3 (GOWN DISPOSABLE) ×6 IMPLANT
KIT ABG SYR 3ML LUER SLIP (SYRINGE) IMPLANT
LIQUID BAND (GAUZE/BANDAGES/DRESSINGS) IMPLANT
NEEDLE HYPO 22GX1.5 SAFETY (NEEDLE) ×3 IMPLANT
NEEDLE HYPO 25X5/8 SAFETYGLIDE (NEEDLE) ×3 IMPLANT
NS IRRIG 1000ML POUR BTL (IV SOLUTION) ×3 IMPLANT
PACK C SECTION WH (CUSTOM PROCEDURE TRAY) ×3 IMPLANT
PAD OB MATERNITY 4.3X12.25 (PERSONAL CARE ITEMS) ×3 IMPLANT
PENCIL SMOKE EVAC W/HOLSTER (ELECTROSURGICAL) ×3 IMPLANT
RTRCTR C-SECT PINK 25CM LRG (MISCELLANEOUS) ×3 IMPLANT
STAPLER VISISTAT 35W (STAPLE) ×3 IMPLANT
SUT GUT PLAIN 0 CT-3 TAN 27 (SUTURE) IMPLANT
SUT MNCRL 0 VIOLET CTX 36 (SUTURE) ×3 IMPLANT
SUT MNCRL AB 4-0 PS2 18 (SUTURE) IMPLANT
SUT MON AB 2-0 CT1 27 (SUTURE) ×3 IMPLANT
SUT MON AB 3-0 SH 27 (SUTURE)
SUT MON AB 3-0 SH27 (SUTURE) IMPLANT
SUT MONOCRYL 0 CTX 36 (SUTURE) ×6
SUT PLAIN 2 0 XLH (SUTURE) IMPLANT
SUT VIC AB 0 CTX 36 (SUTURE) ×4
SUT VIC AB 0 CTX36XBRD ANBCTRL (SUTURE) ×2 IMPLANT
SYR CONTROL 10ML LL (SYRINGE) ×3 IMPLANT
TOWEL OR 17X24 6PK STRL BLUE (TOWEL DISPOSABLE) ×3 IMPLANT
TRAY FOLEY CATH SILVER 14FR (SET/KITS/TRAYS/PACK) ×3 IMPLANT
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
## 2015-08-27 NOTE — Transfer of Care (Signed)
Immediate Anesthesia Transfer of Care Note  Patient: Angela [name]  Procedure(s) Performed: Procedure(s): CESAREAN SECTION (N/A)  Patient Location: PACU  Anesthesia Type:Spinal  Level of Consciousness: awake  Airway & Oxygen Therapy: Patient Spontanous Breathing  Post-op Assessment: Report given to RN  Post vital signs: Reviewed and stable  Last Vitals:  Filed Vitals:   08/27/15 1124  BP: 114/71  Pulse: 77  Temp: 37.1 C  Resp: 16    Complications: back pain

## 2015-08-27 NOTE — Lactation Note (Signed)
This note was copied from a baby's chart. Lactation Consultation Note  Patient Name: Angela [name] [id number]'[id number]'s Date: 08/27/2015 Reason for consult: Initial assessment  With this experience breast feeding mom, and term baby, SGA, aweighing 5 lbs 14.5 oz. Mom has very large nipples, and sh can latch the baby deeply, but she can not maintain the latch. It may be because her mouth is full of mom's nipple , yet at times she suckles well, strong and rhythmic. DEP set up for mom to protect her milk and provide EBm, baby, if needed. Mom prefers to bottle feed EBM, so nipple placed in room. Mom knows to call for questions/concerns. Lactation services reviewed.    Maternal Data Formula Feeding for Exclusion: No Has patient been taught Hand Expression?: Yes Does the patient have breastfeeding experience prior to this delivery?: Yes  Feeding Feeding Type: Breast Fed Length of feed: 15 min (latching and unlatching off and on)  LATCH Score/Interventions Latch: Repeated attempts needed to sustain latch, nipple held in mouth throughout feeding, stimulation needed to elicit sucking reflex. (on and off) Intervention(s): Adjust position;Assist with latch;Breast compression  Audible Swallowing: A few with stimulation (drops of colostrum given to baby) Intervention(s): Skin to skin;Hand expression  Type of Nipple: Everted at rest and after stimulation (large, soft nipple - baby can latch beyond, but not maintain)  Comfort (Breast/Nipple): Soft / non-tender     Hold (Positioning): No assistance needed to correctly position infant at breast. Intervention(s): Breastfeeding basics reviewed;Support Pillows;Position options;Skin to skin  LATCH Score: 8  Lactation Tools Discussed/Used Pump Review: Setup, frequency, and cleaning;Milk Storage;Other (comment) (initiation setting, hand expression) Initiated by:: c Nicki Furlan rn ibclc Date initiated:: 08/27/15   Consult Status Consult Status: Follow-up Date:  08/28/15 Follow-up type: In-patient    [name], [name] 08/27/2015, 6:56 PM

## 2015-08-27 NOTE — Op Note (Signed)
Cesarean Section Procedure Note   Angela LashJenelle Elsbury   08/27/2015  Indications: Scheduled Proceedure/Maternal Request   Pre-operative Diagnosis: REPEAT C/S.   Post-operative Diagnosis: Same   Surgeon: Coral CeoHARPER,CHARLES A  Assistants: Kathreen CosierMarshall, Bernard A.  Anesthesia: spinal  Procedure:  Repeat LTCS  Procedure Details:  The patient was seen in the Holding Room. The risks, benefits, complications, treatment options, and expected outcomes were discussed with the patient. The patient concurred with the proposed plan, giving informed consent. The patient was identified as Angela [name] and the procedure verified as C-Section Delivery. A Time Out was held and the above information confirmed.  After induction of anesthesia, the patient was draped and prepped in the usual sterile manner. A transverse incision was made and carried down through the subcutaneous tissue to the fascia. The fascial incision was made and extended transversely. The fascia was separated from the underlying rectus tissue superiorly and inferiorly. The peritoneum was identified and entered. The peritoneal incision was extended longitudinally. The utero-vesical peritoneal reflection was incised transversely and the bladder flap was bluntly freed from the lower uterine segment. A low transverse uterine incision was made. Delivered from cephalic presentation was a 2680 gram living newborn female infant(s). APGAR (1 MIN): 9   APGAR (5 MINS): 9   APGAR (10 MINS):    A cord ph was not sent. The umbilical cord was clamped and cut cord. A sample was obtained for evaluation. The placenta was removed Intact and appeared normal.  The uterine incision was closed with running locked sutures of 1-0 Monocryl. A second imbricating layer of the same suture was placed.  Hemostasis was observed. The paracolic gutters were irrigated. The parieto peritoneum was closed in a running fashion with 2-0 Vicryl.  The fascia was then reapproximated with running  sutures of 0 Vicryl.  The skin was closed with staples.  Instrument, sponge, and needle counts were correct prior the abdominal closure and were correct at the conclusion of the case.    Findings:  Normal uterus, ovaries and tubes   Estimated Blood Loss:  600ml  Total IV Fluids: 2700ml   Urine Output: 425CC OF clear urine  Specimens: Placenta to Pathology  Complications: no complications  Disposition: PACU - hemodynamically stable.  Maternal Condition: stable   Baby condition / location:  Couplet care / Skin to Skin    Signed: Surgeon(s): Brock Badharles A Harper, MD Kathreen CosierBernard A Marshall, MD

## 2015-08-27 NOTE — Anesthesia Procedure Notes (Addendum)
Spinal  Staffing Anesthesiologist: Kedric Bumgarner Performed by: anesthesiologist  Preanesthetic Checklist Completed: patient identified, site marked, surgical consent, pre-op evaluation, timeout performed, IV checked, risks and benefits discussed and monitors and equipment checked Spinal Block Patient position: sitting Prep: site prepped and draped and DuraPrep Patient monitoring: heart rate, continuous pulse ox and blood pressure Approach: midline Location: L4-5 Injection technique: single-shot Needle Needle type: Pencan  Needle gauge: 24 G Needle length: 9 cm Assessment Sensory level: T6   

## 2015-08-27 NOTE — Anesthesia Preprocedure Evaluation (Addendum)
Anesthesia Evaluation  Patient identified by MRN, date of birth, ID band Patient awake    Reviewed: Allergy & Precautions, NPO status , Patient's Chart, lab work & pertinent test results  Airway Mallampati: I  TM Distance: >3 FB Neck ROM: Full    Dental   Pulmonary neg pulmonary ROS,    Pulmonary exam normal        Cardiovascular negative cardio ROS Normal cardiovascular exam     Neuro/Psych negative neurological ROS     GI/Hepatic negative GI ROS, Neg liver ROS,   Endo/Other  negative endocrine ROS  Renal/GU negative Renal ROS     Musculoskeletal   Abdominal   Peds  Hematology negative hematology ROS (+)   Anesthesia Other Findings   Reproductive/Obstetrics (+) Pregnancy IUP for repeat C/S                            Lab Results  Component Value Date   WBC 8.5 08/26/2015   HGB 12.9 08/26/2015   HCT 37.2 08/26/2015   MCV 82.7 08/26/2015   PLT 234 08/26/2015   Lab Results  Component Value Date   CREATININE 0.95 07/20/2014   BUN 5* 07/20/2014   NA 136 07/20/2014   K 3.2* 07/20/2014   CL 107 07/20/2014   CO2 23 07/20/2014    Anesthesia Physical Anesthesia Plan  ASA: II  Anesthesia Plan: Spinal   Post-op Pain Management:    Induction:   Airway Management Planned: Natural Airway  Additional Equipment:   Intra-op Plan:   Post-operative Plan:   Informed Consent: I have reviewed the patients History and Physical, chart, labs and discussed the procedure including the risks, benefits and alternatives for the proposed anesthesia with the patient or authorized representative who has indicated his/her understanding and acceptance.     Plan Discussed with: CRNA  Anesthesia Plan Comments:         Anesthesia Quick Evaluation

## 2015-08-27 NOTE — H&P (Signed)
Angela [name] is a 30 y.o. female presenting for repeat C/S. Maternal Medical History:  Reason for admission: For repeat C/S.  Fetal activity: Perceived fetal activity is normal.   Last perceived fetal movement was within the past hour.    Prenatal complications: Polyhydramnios.   Prenatal Complications - Diabetes: none.    OB History    Gravida Para Term Preterm AB TAB SAB Ectopic Multiple Living   7 3 3  0 3 0 3 0 0 3     Past Medical History  Diagnosis Date  . Anemia   . No pertinent past medical history   . Headache(784.0)   . Sickle cell trait [facility](HCC)    Past Surgical History  Procedure Laterality Date  . Cesarean section    . Cesarean section  05/27/2012    Procedure: CESAREAN SECTION;  Surgeon: Antionette CharLisa Jackson-Moore, MD;  Location: [facility];  Service: Obstetrics;  Laterality: N/A;  Repeat cesarean section with delivery of baby boy at [id number].    Family History: family history includes Cancer in her father and paternal aunt. There is no history of Other. Social History:  reports that she has never smoked. She has never used smokeless tobacco. She reports that she does not drink alcohol or use illicit drugs.   Prenatal Transfer Tool  Maternal Diabetes: No Genetic Screening: Normal Maternal Ultrasounds/Referrals: Normal Fetal Ultrasounds or other Referrals:  None Maternal Substance Abuse:  No Significant Maternal Medications:  None Significant Maternal Lab Results:  None Other Comments:  None  Review of Systems  All other systems reviewed and are negative.     Blood pressure 114/71, pulse 77, temperature 98.8 F (37.1 C), temperature source Oral, resp. rate 16, last menstrual period 11/26/2014, SpO2 100 %. Maternal Exam:  Abdomen: Patient reports no abdominal tenderness.   Physical Exam  Nursing note and vitals reviewed. Constitutional: She is oriented to person, place, and time. She appears well-developed and well-nourished.  HENT:  Head: Normocephalic and  atraumatic.  Eyes: Conjunctivae are normal. Pupils are equal, round, and reactive to light.  Neck: Normal range of motion. Neck supple.  Cardiovascular: Normal rate and regular rhythm.   Respiratory: Effort normal and breath sounds normal.  GI: Soft.  Neurological: She is alert and oriented to person, place, and time.  Skin: Skin is warm and dry.  Psychiatric: She has a normal mood and affect. Her behavior is normal. Judgment and thought content normal.    Prenatal labs: ABO, Rh: --/--/A POS (04/06 1000) Antibody: NEG (04/06 1000) Rubella: 2.48 (10/04 1401) RPR: Non Reactive (04/06 1000)  HBsAg: NEGATIVE (10/04 1401)  HIV: NONREACTIVE (01/18 1142)  GBS: Negative (03/15 1454)   Assessment/Plan: 39 weeks.  Previous C/S x 2.  For repeat C/S.   Tracye Szuch A 08/27/2015, 12:24 PM

## 2015-08-28 LAB — CBC
HCT: 31.7 % — ABNORMAL LOW (ref 36.0–46.0)
HEMOGLOBIN: 11 g/dL — AB (ref 12.0–15.0)
MCH: 28.9 pg (ref 26.0–34.0)
MCHC: 34.7 g/dL (ref 30.0–36.0)
MCV: 83.2 fL (ref 78.0–100.0)
PLATELETS: 173 10*3/uL (ref 150–400)
RBC: 3.81 MIL/uL — ABNORMAL LOW (ref 3.87–5.11)
RDW: 16 % — AB (ref 11.5–15.5)
WBC: 7.7 10*3/uL (ref 4.0–10.5)

## 2015-08-28 NOTE — Progress Notes (Signed)
Patient ID: [name], female   DOB: 09/12/1985, 30 y.o.   MRN: [id number] Postop day 1 Blood pressure 101/52 respiration 20 pulse 59 Fundus firm Dressing dry Legs negative doing well

## 2015-08-28 NOTE — Anesthesia Postprocedure Evaluation (Signed)
Anesthesia Post Note  Patient: Angela [name]  Procedure(s) Performed: Procedure(s) (LRB): CESAREAN SECTION (N/A)  Patient location during evaluation: PACU Anesthesia Type: Spinal and MAC Level of consciousness: awake and alert Pain management: pain level controlled Vital Signs Assessment: post-procedure vital signs reviewed and stable Respiratory status: spontaneous breathing and respiratory function stable Cardiovascular status: blood pressure returned to baseline and stable Postop Assessment: spinal receding Anesthetic complications: no    Last Vitals:  Filed Vitals:   08/28/15 0018 08/28/15 0322  BP: 107/63 101/52  Pulse: 59 59  Temp: 36.7 C 36.7 C  Resp: 20 20    Last Pain:  Filed Vitals:   08/28/15 0640  PainSc: 7                  Kennieth RadFitzgerald, Lurlene Ronda E

## 2015-08-28 NOTE — Addendum Note (Signed)
Addendum  created 08/28/15 1023 by Algis GreenhouseLinda A Ziaire Bieser, CRNA   Modules edited: Clinical Notes   Clinical Notes:  File: [id number]

## 2015-08-28 NOTE — Lactation Note (Signed)
This note was copied from a baby's chart. Lactation Consultation Note  Patient Name: Angela [name]: 08/28/2015 Reason for consult: Follow-up assessment Baby at 32 hr of life and baby is having a hard time latching. Mom has soft breast with a large nipple. Baby is jettery and does not open wide enough to get the entire nipple in her mouth. Applied #24 NS and baby was able to open mouth/flange lips much better. Tried a 5 Fr with Alimentum at the breast, baby only took 5 ml before coming off the breast. Tried to offer the baby more but she gagged and spit it out. Mom stated that her babies do not like formula. Encouraged mom to pump then manually express to get a supplement. Report given to RN.     Maternal Data    Feeding Feeding Type: Breast Fed Nipple Type: Slow - flow Length of feed: 20 min  LATCH Score/Interventions Latch: Repeated attempts needed to sustain latch, nipple held in mouth throughout feeding, stimulation needed to elicit sucking reflex. Intervention(s): Adjust position;Assist with latch;Breast massage;Breast compression  Audible Swallowing: A few with stimulation Intervention(s): Hand expression Intervention(s): Alternate breast massage  Type of Nipple: Everted at rest and after stimulation  Comfort (Breast/Nipple): Soft / non-tender     Hold (Positioning): Full assist, staff holds infant at breast Intervention(s): Support Pillows;Position options  LATCH Score: 6  Lactation Tools Discussed/Used Tools: Nipple Shields;57F feeding tube / Syringe Nipple shield size: 24   Consult Status Consult Status: Follow-up [name]: 08/29/15 Follow-up type: In-patient    Rulon Eisenmengerlizabeth E Jayah Balthazar 08/28/2015, 9:26 PM

## 2015-08-28 NOTE — Anesthesia Postprocedure Evaluation (Signed)
Anesthesia Post Note  Patient: Angela [name]  Procedure(s) Performed: Procedure(s) (LRB): CESAREAN SECTION (N/A)  Patient location during evaluation: Mother Baby Anesthesia Type: Spinal Level of consciousness: awake Pain management: satisfactory to patient Vital Signs Assessment: post-procedure vital signs reviewed and stable Respiratory status: spontaneous breathing Cardiovascular status: stable Anesthetic complications: no    Last Vitals:  Filed Vitals:   08/28/15 0322 08/28/15 0807  BP: 101/52 98/51  Pulse: 59 56  Temp: 36.7 C 36.9 C  Resp: 20 18    Last Pain:  Filed Vitals:   08/28/15 0815  PainSc: Asleep                 Cephus ShellingBURGER,[name]

## 2015-08-29 NOTE — Lactation Note (Signed)
This note was copied from a baby's chart. Lactation Consultation Note  Patient Name: Angela [name] WGNFA'OToday's Date: 08/29/2015 Reason for consult: Follow-up assessment Baby at 55 hr of life and mom stated bf is not going well. Right before entry mom had fed formula. She stated she was going to change baby's diaper then pump so she would have expressed milk for the next feeding. Mom reports pumping every 2 hr but she has only offered her milk 4 times in the last 24hr. Discussed milk supply/transition, feeding frequency, and supplementing. Encouraged mom to offer the breast on demand 8+/24 hr. Try latch without the NS for 3-5 minutes then try with the NS for 3-5 minutes. If baby does not latch offer pumped milk or formula per guidelines then pump for the next feeding. Mom seems agreeable with this plan. Baby was jittery at this visit. Report given to RN.    Maternal Data    Feeding Feeding Type: Breast Fed Length of feed: 12 min  LATCH Score/Interventions Latch: Repeated attempts needed to sustain latch, nipple held in mouth throughout feeding, stimulation needed to elicit sucking reflex. Intervention(s): Skin to skin;Teach feeding cues;Waking techniques Intervention(s): Adjust position                    Lactation Tools Discussed/Used     Consult Status Consult Status: Follow-up Date: 08/30/15 Follow-up type: In-patient    Angela Eisenmengerlizabeth E Onedia [name] 08/29/2015, 8:35 PM

## 2015-08-29 NOTE — Progress Notes (Signed)
Patient ID: Angela LashJenelle [name], female   DOB: 11/19/1985, 30 y.o.   MRN: [id number] Postop day 2 Blood pressure 105/64 pulse 52 respiration 18 Fundus firm Lochia moderate Doing well

## 2015-08-30 ENCOUNTER — Encounter (HOSPITAL_COMMUNITY): Payer: Self-pay | Admitting: Obstetrics

## 2015-08-30 LAB — TYPE AND SCREEN
ABO/RH(D): A POS
Antibody Screen: NEGATIVE
UNIT DIVISION: 0
Unit division: 0

## 2015-08-30 MED ORDER — OXYCODONE-ACETAMINOPHEN 5-325 MG PO TABS: 1.0000 | ORAL_TABLET | ORAL | Status: DC | PRN

## 2015-08-30 MED ORDER — IBUPROFEN 600 MG PO TABS: 600.0000 mg | ORAL_TABLET | Freq: Four times a day (QID) | ORAL | Status: DC | PRN

## 2015-08-30 MED ORDER — PRENATAL MULTIVITAMIN CH: 1.0000 | ORAL_TABLET | Freq: Every day | ORAL | Status: DC

## 2015-08-30 NOTE — Progress Notes (Signed)
Subjective: Postpartum Day 3: Cesarean Delivery Patient reports tolerating PO, + flatus, + BM and no problems voiding.    Objective: Vital signs in last 24 hours: Temp:  [97.3 F (36.3 C)-98.2 F (36.8 C)] 98.2 F (36.8 C) (04/10 0450) Pulse Rate:  [59-66] 59 (04/10 0450) Resp:  [18] 18 (04/10 0450) BP: (120-128)/(69-78) 128/78 mmHg (04/10 0450)  Physical Exam:  General: alert and no distress Lochia: appropriate Uterine Fundus: firm Incision: healing well DVT Evaluation: No evidence of DVT seen on physical exam.   Recent Labs  08/28/15 0652  HGB 11.0*  HCT 31.7*    Assessment/Plan: Status post Cesarean section. Doing well postoperatively.  Discharge home with standard precautions and return to clinic in 2 weeks.  HARPER,CHARLES A 08/30/2015, 8:01 AM

## 2015-08-30 NOTE — Discharge Summary (Signed)
Obstetric Discharge Summary Reason for Admission: cesarean section Prenatal Procedures: ultrasound Intrapartum Procedures: cesarean: low cervical, transverse Postpartum Procedures: none Complications-Operative and Postpartum: none HEMOGLOBIN  Date Value Ref Range Status  08/28/2015 11.0* 12.0 - 15.0 g/dL Final   HCT  Date Value Ref Range Status  08/28/2015 31.7* 36.0 - 46.0 % Final    Physical Exam:  General: alert and no distress Lochia: appropriate Uterine Fundus: firm Incision: healing well DVT Evaluation: No evidence of DVT seen on physical exam.  Discharge Diagnoses: Term Pregnancy-delivered  Discharge Information: Date: 08/30/2015 Activity: pelvic rest Diet: routine Medications: PNV, Ibuprofen, Colace and Percocet Condition: stable Instructions: refer to practice specific booklet Discharge to: home Follow-up Information    Follow up with Nolita Kutter A, MD. Schedule an appointment as soon as possible for a visit in 3 days.   Specialty:  Obstetrics and Gynecology   Why:  removal of staples   Contact information:   [address] [place] [address] [phone]       Newborn Data: Live born female  Birth Weight: 5 lb 14.5 oz (2680 g) APGAR: 9, 9  Home with mother.  Averee Harb A 08/30/2015, 8:08 AM

## 2015-08-30 NOTE — Lactation Note (Signed)
This note was copied from a baby's chart. Lactation Consultation Note: Mother states that milk is coming in and infant is breastfeeding better.  She is giving formula with a bottle as well.  Mother advised to decrease amts of formula and use her own breastmilk .  Mother was given a hand pump and with the use of a #27 flange she observed that she had a good flow of milk.  Mother advised in treatment plan of ice and good massage to prevent engorgement.  Mother advised to rouse infant well for feedings to drain breast every 2-3 hours.  Mother very receptive to teaching.   Patient Name: Angela [name] Reason for consult: Follow-up assessment   Maternal Data Formula Feeding for Exclusion: No  Feeding Feeding Type: Bottle Fed - Formula Nipple Type: Slow - flow  LATCH Score/Interventions                      Lactation Tools Discussed/Used     Consult Status Consult Status: Complete    Michel BickersKendrick, Angela [name] [name], 11:11 AM

## 2015-09-01 ENCOUNTER — Ambulatory Visit (INDEPENDENT_AMBULATORY_CARE_PROVIDER_SITE_OTHER): Payer: Medicaid Other | Admitting: Obstetrics

## 2015-09-01 DIAGNOSIS — N63 Unspecified lump in breast: Secondary | ICD-10-CM

## 2015-09-01 DIAGNOSIS — N6310 Unspecified lump in the right breast, unspecified quadrant: Secondary | ICD-10-CM

## 2015-09-01 DIAGNOSIS — N6315 Unspecified lump in the right breast, overlapping quadrants: Secondary | ICD-10-CM

## 2015-09-02 ENCOUNTER — Encounter: Payer: Self-pay | Admitting: Obstetrics

## 2015-09-02 ENCOUNTER — Encounter: Payer: Medicaid Other | Admitting: Obstetrics

## 2015-09-02 NOTE — Progress Notes (Signed)
POD#5 S:  Complains of right breast mass that was discovered yesterday.  S/P repeat LTCS.  Presents for removal of staples.      Comprehensive 14 point review of systems negative, except for breast lump O:  Afebrile, VSS       Breasts:  Soft.  Mass palpated in right breast at 12 o'clock.       Abdomen:  Incision C, D, I.  Staples removed and steri strips applied.       Extremities:  No C, C, E. A:   Doing well. P:   [facility].  F/U in 2 weeks.  [name] MD 09-01-15

## 2015-09-13 ENCOUNTER — Other Ambulatory Visit: Payer: Self-pay | Admitting: Obstetrics

## 2015-09-14 ENCOUNTER — Ambulatory Visit
Admission: RE | Admit: 2015-09-14 | Discharge: 2015-09-14 | Disposition: A | Discharge status: Home / Self Care | Payer: Medicaid Other | Source: Ambulatory Visit | Attending: Obstetrics | Admitting: Obstetrics

## 2015-09-14 DIAGNOSIS — N6310 Unspecified lump in the right breast, unspecified quadrant: Principal | ICD-10-CM

## 2015-09-14 DIAGNOSIS — N6315 Unspecified lump in the right breast, overlapping quadrants: Secondary | ICD-10-CM

## 2015-09-23 ENCOUNTER — Ambulatory Visit (INDEPENDENT_AMBULATORY_CARE_PROVIDER_SITE_OTHER): Payer: Medicaid Other | Admitting: Obstetrics

## 2015-09-23 ENCOUNTER — Encounter: Payer: Self-pay | Admitting: Obstetrics

## 2015-09-23 DIAGNOSIS — Z30011 Encounter for initial prescription of contraceptive pills: Secondary | ICD-10-CM

## 2015-09-23 DIAGNOSIS — G8918 Other acute postprocedural pain: Secondary | ICD-10-CM

## 2015-09-23 MED ORDER — OXYCODONE-ACETAMINOPHEN 10-325 MG PO TABS: 1.0000 | ORAL_TABLET | ORAL | Status: DC | PRN

## 2015-09-23 MED ORDER — NORETHINDRONE 0.35 MG PO TABS: 1.0000 | ORAL_TABLET | Freq: Every day | ORAL | Status: DC

## 2015-09-23 NOTE — Progress Notes (Signed)
Subjective:     Angela [name] is a 30 y.o. female who presents for a postpartum visit. She is 2 weeks postpartum following a low cervical transverse Cesarean section. I have fully reviewed the prenatal and intrapartum course. The delivery was at 39 gestational weeks. Outcome: repeat cesarean section, low transverse incision. Anesthesia: spinal. Postpartum course has been normal. Baby's course has been normal. Baby is feeding by breast. Bleeding thin lochia. Bowel function is normal. Bladder function is normal. Patient is not sexually active. Contraception method is abstinence. Postpartum depression screening: negative.  Tobacco, alcohol and substance abuse history reviewed.  Adult immunizations reviewed including TDAP, rubella and varicella.  The following portions of the patient's history were reviewed and updated as appropriate: allergies, current medications, past family history, past medical history, past social history, past surgical history and problem list.  Review of Systems A comprehensive review of systems was negative.   Objective:    BP 100/68 mmHg  Pulse 73  Wt 177 lb (80.287 kg)    PE:  General:  Alert and no distress   Breasts:  Soft and non tender   Abdomen:  Soft, NT.  Incision C, D, I.  Extremities:  No C, C, E.  Assessment:    2 weeks postpartum.  Doing well.  Contraceptive counseling and advice.  Wants OCP.  Plan:    1. Contraception: oral progesterone-only contraceptive 2. Micronor Rx 3. Follow up in: 4 weeks or as needed.   Healthy lifestyle practices reviewed

## 2015-10-21 ENCOUNTER — Ambulatory Visit (INDEPENDENT_AMBULATORY_CARE_PROVIDER_SITE_OTHER): Payer: Medicaid Other | Admitting: Obstetrics

## 2015-10-21 DIAGNOSIS — Z3041 Encounter for surveillance of contraceptive pills: Secondary | ICD-10-CM | POA: Diagnosis not present

## 2015-10-23 ENCOUNTER — Encounter: Payer: Self-pay | Admitting: Obstetrics

## 2015-10-23 NOTE — Progress Notes (Signed)
Subjective:     Angela [name] is a 30 y.o. female who presents for a postpartum visit. She is 7 weeks postpartum following a low cervical transverse Cesarean section. I have fully reviewed the prenatal and intrapartum course. The delivery was at 39 gestational weeks. Outcome: repeat cesarean section, low transverse incision. Anesthesia: spinal. Postpartum course has been normal. Baby's course has been normal. Baby is feeding by breast. Bleeding no bleeding. Bowel function is normal. Bladder function is normal. Patient is not sexually active. Contraception method is abstinence. Postpartum depression screening: negative.  Tobacco, alcohol and substance abuse history reviewed.  Adult immunizations reviewed including TDAP, rubella and varicella.  The following portions of the patient's history were reviewed and updated as appropriate: allergies, current medications, past family history, past medical history, past social history, past surgical history and problem list.  Review of Systems A comprehensive review of systems was negative.   Objective:    BP 110/76 mmHg  Pulse 86  Wt 177 lb (80.287 kg)  General:  alert and no distress   Breasts:  inspection negative, no nipple discharge or bleeding, no masses or nodularity palpable  Lungs: clear to auscultation bilaterally  Heart:  regular rate and rhythm, S1, S2 normal, no murmur, click, rub or gallop  Abdomen: normal findings: soft, non-tender and incision C, D, I   Vulva:  normal  Vagina: normal vagina  Cervix:  no cervical motion tenderness  Corpus: normal size, contour, position, consistency, mobility, non-tender  Adnexa:  no mass, fullness, tenderness  Rectal Exam: Not performed.          Assessment:     Normal postpartum exam. Pap smear not done at today's visit.    Plan:    1. Contraception: oral progesterone-only contraceptive 2. Micronor Rx 3. Follow up in: 6 weeks or as needed.   Healthy lifestyle practices reviewed

## 2015-10-25 ENCOUNTER — Encounter (HOSPITAL_COMMUNITY): Payer: Self-pay | Admitting: *Deleted

## 2015-10-25 ENCOUNTER — Emergency Department (HOSPITAL_COMMUNITY)
Admission: EM | Admit: 2015-10-25 | Discharge: 2015-10-26 | Disposition: A | Discharge status: Home / Self Care | Payer: Medicaid Other | Attending: Emergency Medicine | Admitting: Emergency Medicine

## 2015-10-25 DIAGNOSIS — M79661 Pain in right lower leg: Secondary | ICD-10-CM | POA: Insufficient documentation

## 2015-10-25 DIAGNOSIS — R519 Headache, unspecified: Secondary | ICD-10-CM

## 2015-10-25 DIAGNOSIS — Z3202 Encounter for pregnancy test, result negative: Secondary | ICD-10-CM | POA: Diagnosis not present

## 2015-10-25 DIAGNOSIS — M546 Pain in thoracic spine: Secondary | ICD-10-CM | POA: Diagnosis not present

## 2015-10-25 DIAGNOSIS — R51 Headache: Secondary | ICD-10-CM | POA: Diagnosis not present

## 2015-10-25 DIAGNOSIS — M79604 Pain in right leg: Secondary | ICD-10-CM

## 2015-10-25 DIAGNOSIS — Z862 Personal history of diseases of the blood and blood-forming organs and certain disorders involving the immune mechanism: Secondary | ICD-10-CM | POA: Insufficient documentation

## 2015-10-25 DIAGNOSIS — M542 Cervicalgia: Secondary | ICD-10-CM | POA: Insufficient documentation

## 2015-10-25 LAB — URINE MICROSCOPIC-ADD ON

## 2015-10-25 LAB — CBC WITH DIFFERENTIAL/PLATELET
BASOS ABS: 0 10*3/uL (ref 0.0–0.1)
BASOS PCT: 0 %
EOS ABS: 0.1 10*3/uL (ref 0.0–0.7)
Eosinophils Relative: 1 %
HCT: 36.6 % (ref 36.0–46.0)
Hemoglobin: 12.4 g/dL (ref 12.0–15.0)
Lymphocytes Relative: 27 %
Lymphs Abs: 2.5 10*3/uL (ref 0.7–4.0)
MCH: 27.6 pg (ref 26.0–34.0)
MCHC: 33.9 g/dL (ref 30.0–36.0)
MCV: 81.3 fL (ref 78.0–100.0)
MONO ABS: 0.7 10*3/uL (ref 0.1–1.0)
MONOS PCT: 8 %
NEUTROS PCT: 64 %
Neutro Abs: 5.8 10*3/uL (ref 1.7–7.7)
PLATELETS: 340 10*3/uL (ref 150–400)
RBC: 4.5 MIL/uL (ref 3.87–5.11)
RDW: 14.4 % (ref 11.5–15.5)
WBC: 9 10*3/uL (ref 4.0–10.5)

## 2015-10-25 LAB — BASIC METABOLIC PANEL
ANION GAP: 7 (ref 5–15)
BUN: 6 mg/dL (ref 6–20)
CALCIUM: 9.6 mg/dL (ref 8.9–10.3)
CO2: 23 mmol/L (ref 22–32)
CREATININE: 0.9 mg/dL (ref 0.44–1.00)
Chloride: 106 mmol/L (ref 101–111)
Glucose, Bld: 87 mg/dL (ref 65–99)
Potassium: 3.8 mmol/L (ref 3.5–5.1)
SODIUM: 136 mmol/L (ref 135–145)

## 2015-10-25 LAB — URINALYSIS, ROUTINE W REFLEX MICROSCOPIC
Glucose, UA: NEGATIVE mg/dL
KETONES UR: 40 mg/dL — AB
Nitrite: NEGATIVE
PROTEIN: NEGATIVE mg/dL
SPECIFIC GRAVITY, URINE: 1.019 (ref 1.005–1.030)
pH: 5.5 (ref 5.0–8.0)

## 2015-10-25 LAB — POC URINE PREG, ED: PREG TEST UR: NEGATIVE

## 2015-10-25 MED ORDER — DIPHENHYDRAMINE HCL 50 MG/ML IJ SOLN
25.0000 mg | Freq: Once | INTRAMUSCULAR | Status: AC
  Administered 2015-10-25: 25 mg via INTRAVENOUS
  Filled 2015-10-25: qty 1

## 2015-10-25 MED ORDER — SODIUM CHLORIDE 0.9 % IV BOLUS (SEPSIS)
1000.0000 mL | Freq: Once | INTRAVENOUS | Status: AC
  Administered 2015-10-25: 1000 mL via INTRAVENOUS

## 2015-10-25 MED ORDER — DEXAMETHASONE SODIUM PHOSPHATE 10 MG/ML IJ SOLN
10.0000 mg | Freq: Once | INTRAMUSCULAR | Status: AC
  Administered 2015-10-25: 10 mg via INTRAVENOUS
  Filled 2015-10-25: qty 1

## 2015-10-25 MED ORDER — KETOROLAC TROMETHAMINE 30 MG/ML IJ SOLN
30.0000 mg | Freq: Once | INTRAMUSCULAR | Status: AC
  Administered 2015-10-25: 30 mg via INTRAVENOUS
  Filled 2015-10-25: qty 1

## 2015-10-25 MED ORDER — PROCHLORPERAZINE EDISYLATE 5 MG/ML IJ SOLN
10.0000 mg | Freq: Once | INTRAMUSCULAR | Status: AC
Start: 2015-10-25 — End: 2015-10-25
  Administered 2015-10-25: 10 mg via INTRAVENOUS
  Filled 2015-10-25: qty 2

## 2015-10-25 NOTE — ED Notes (Signed)
MD at bedside. 

## 2015-10-25 NOTE — ED Notes (Signed)
Pt in c/o headache and left calf pain since Saturday. Reports nausea but denies vomiting, no swelling or redness noted to calf, left pedal pulses intact.

## 2015-10-25 NOTE — ED Notes (Signed)
Patient presents stating Saturday she started with headache, neck soreness and right lower leg calf pain

## 2015-10-25 NOTE — ED Notes (Addendum)
MD went into pt room to assess the pt. MD informed this RN that the pt was not in the room. This RN found the pt wandering in the hallway trying to Pt was leaving AMA.Pt found wandering down the hallway. Pt states that "I can lay at home in pain and I want to go." Pt agreed to come back into the room to wait for the MD. MD informed that the pt is now back in the room.

## 2015-10-25 NOTE — ED Provider Notes (Signed)
CSN: [id number]     Arrival date & time 10/25/15  1942 History   By signing my name below, I, Arianna Nassar, attest that this documentation has been prepared under the direction and in the presence of Gilda Creasehristopher J Pollina, MD. Electronically Signed: Octavia HeirArianna Nassar, ED Scribe. 10/25/2015. 11:37 PM.      Chief Complaint  Patient presents with  . Leg Pain  . Headache     The history is provided by the patient. No language interpreter was used.   HPI Comments: Angela [name] is a 30 y.o. female who has a PMHx of anemia and headache presents to the Emergency Department complaining of sudden onset, gradual worsening, moderate myalgias onset 3 days ago. She reports associated constant right foot pain and bilateral neck pain that radiates to her head. Pt says her headache is left sided and goes into her left ear. She notes not having any injury to her right foot but states it is painful when she stands. She reports having headaches in the past but none this bad. She has not taken any medication to alleviate her pain. She denies photophobia, falls, or injuries to head.  Past Medical History  Diagnosis Date  . Anemia   . No pertinent past medical history   . Headache(784.0)   . Sickle cell trait [facility](HCC)    Past Surgical History  Procedure Laterality Date  . Cesarean section    . Cesarean section  05/27/2012    Procedure: CESAREAN SECTION;  Surgeon: Antionette CharLisa Jackson-Moore, MD;  Location: [facility];  Service: Obstetrics;  Laterality: N/A;  Repeat cesarean section with delivery of baby boy at [id number].   Marland Kitchen. Cesarean section N/A 08/27/2015    Procedure: CESAREAN SECTION;  Surgeon: Brock Badharles A Harper, MD;  Location: [facility];  Service: Obstetrics;  Laterality: N/A;   Family History  Problem Relation Age of Onset  . Other Neg Hx   . Cancer Father   . Cancer Paternal Aunt    Social History  Substance Use Topics  . Smoking status: Never Smoker   . Smokeless tobacco: Never Used  . Alcohol Use: No   OB History     Gravida Para Term Preterm AB TAB SAB Ectopic Multiple Living   7 4 4  0 3 0 3 0 0 4     Review of Systems  Eyes: Negative for photophobia.  Musculoskeletal: Positive for myalgias and neck pain.  Neurological: Positive for headaches.  All other systems reviewed and are negative.     Allergies  Review of patient's allergies indicates no known allergies.  Home Medications   Prior to Admission medications   Medication Sig Start Date End Date Taking? Authorizing Provider  ibuprofen (ADVIL,MOTRIN) 600 MG tablet Take 1 tablet (600 mg total) by mouth every 6 (six) hours as needed for mild pain. Patient not taking: Reported on 09/23/2015 08/30/15   Brock Badharles A Harper, MD  norethindrone (MICRONOR,CAMILA,ERRIN) 0.35 MG tablet Take 1 tablet (0.35 mg total) by mouth daily. Patient not taking: Reported on 10/21/2015 09/23/15   Brock Badharles A Harper, MD  oxyCODONE-acetaminophen (PERCOCET) 10-325 MG tablet Take 1 tablet by mouth every 4 (four) hours as needed for pain. Patient not taking: Reported on 10/25/2015 09/23/15   Brock Badharles A Harper, MD  Prenatal Vit-Fe Phos-FA-Omega (VITAFOL GUMMIES) 3.33-0.333-34.8 MG CHEW Chew 3 tablets by mouth daily. Patient not taking: Reported on 10/25/2015 07/21/15   Roe Coombsachelle A Denney, CNM   Triage vitals: BP 131/61 mmHg  Pulse 95  Temp(Src) 99.3 F (37.4 C) (  Oral)  Resp 20  Ht  (1.575 m)  Wt 169 lb 4.8 oz (76.794 kg)  BMI 30.96 kg/m2  SpO2 98%  LMP 10/22/2015 Physical Exam  Constitutional: She is oriented to person, place, and time. She appears well-developed and well-nourished. No distress.  HENT:  Head: Normocephalic and atraumatic.  Right Ear: Hearing normal.  Left Ear: Hearing normal.  Nose: Nose normal.  Mouth/Throat: Oropharynx is clear and moist and mucous membranes are normal.  Eyes: Conjunctivae and EOM are normal. Pupils are equal, round, and reactive to light.  Neck: Normal range of motion. Neck supple.  Cardiovascular: Regular rhythm, S1 normal and S2  normal.  Exam reveals no gallop and no friction rub.   No murmur heard. Pulmonary/Chest: Effort normal and breath sounds normal. No respiratory distress. She exhibits no tenderness.  Abdominal: Soft. Normal appearance and bowel sounds are normal. There is no hepatosplenomegaly. There is no tenderness. There is no rebound, no guarding, no tenderness at McBurney's point and negative Murphy's sign. No hernia.  Musculoskeletal: Normal range of motion. She exhibits tenderness. She exhibits no edema.  Diffuse bilateral thoracic and cervical paraspinal tenderness Right calf tenderness without swelling    Neurological: She is alert and oriented to person, place, and time. She has normal strength. No cranial nerve deficit or sensory deficit. Coordination normal. GCS eye subscore is 4. GCS verbal subscore is 5. GCS motor subscore is 6.  Extraocular muscle movement: normal No visual field cut Pupils: equal and reactive both direct and consensual response is normal No nystagmus present    Sensory function is intact to light touch, pinprick Proprioception intact  Grip strength 5/5 symmetric in upper extremities No pronator drift Normal finger to nose bilaterally  Lower extremity strength 5/5 against gravity Normal heel to shin bilaterally  Gait: normal   Skin: Skin is warm, dry and intact. No rash noted. No cyanosis.  Psychiatric: She has a normal mood and affect. Her speech is normal and behavior is normal. Thought content normal.  Nursing note and vitals reviewed.   ED Course  Procedures  DIAGNOSTIC STUDIES: Oxygen Saturation is 98% on RA, normal by my interpretation.  COORDINATION OF CARE:  11:34 PM Will order CT head. Discussed treatment plan which includes pain medication with pt at bedside and pt agreed to plan.  Labs Review Labs Reviewed  URINALYSIS, ROUTINE W REFLEX MICROSCOPIC (NOT AT [facility]) - Abnormal; Notable for the following:    Color, Urine AMBER (*)    APPearance CLOUDY  (*)    Hgb urine dipstick LARGE (*)    Bilirubin Urine SMALL (*)    Ketones, ur 40 (*)    Leukocytes, UA TRACE (*)    All other components within normal limits  URINE MICROSCOPIC-ADD ON - Abnormal; Notable for the following:    Squamous Epithelial / LPF 0-5 (*)    Bacteria, UA FEW (*)    All other components within normal limits  D-DIMER, QUANTITATIVE (NOT AT [facility]) - Abnormal; Notable for the following:    D-Dimer, Quant 1.33 (*)    All other components within normal limits  CBC WITH DIFFERENTIAL/PLATELET  BASIC METABOLIC PANEL  POC URINE PREG, ED    Imaging Review Ct Head Wo Contrast  10/26/2015  CLINICAL DATA:  Initial evaluation for acute headache. EXAM: CT HEAD WITHOUT CONTRAST TECHNIQUE: Contiguous axial images were obtained from the base of the skull through the vertex without intravenous contrast. COMPARISON:  Prior study from 06/21/2013. FINDINGS: There is no acute intracranial  hemorrhage or infarct. No mass lesion or midline shift. Gray-white matter differentiation is well maintained. Ventricles are normal in size without evidence of hydrocephalus. CSF containing spaces are within normal limits. No extra-axial fluid collection. The calvarium is intact. Orbital soft tissues are within normal limits. Remote defect at the right lamina for pre shift. Mild scattered opacity within the ethmoidal air cells. Minimal mucosal thickening within the partially visualized left maxillary sinus. Paranasal sinuses are otherwise clear. No mastoid effusion. Scalp soft tissues are unremarkable. IMPRESSION: Normal head CT.  No acute intracranial process identified. Electronically Signed   By: Rise Mu M.D.   On: 10/26/2015 00:59   I have personally reviewed and evaluated these images and lab results as part of my medical decision-making.   EKG Interpretation None      MDM   Final diagnoses:  Headache, unspecified headache type  Pain of right lower extremity    Patient presents to  the emergency department for multiple problems. Patient reports that she has been experiencing a severe headache. She does have frequent headaches but the headache tonight is worse than it has been. She has a normal neurologic evaluation, however. Patient has had resolution of her headache with migraine cocktail. Her head CT was normal. It is not felt that she requires any further workup. No signs of meningismus, no fever.  Patient also complaining of pain in the right lower leg. She reports it starts at the heel and goes up the back of the leg to the knee. There is no calf swelling, venous cords. She has normal overlying skin, no sign of infection. Pulses are present. D-dimer was elevated, however. Patient will have outpatient DVT study. She is felt to be low risk, does not require Lovenox as study will be performed in several hours.  I personally performed the services described in this documentation, which was scribed in my presence. The recorded information has been reviewed and is accurate.    Gilda Crease, MD 10/26/15 [phone]

## 2015-10-26 ENCOUNTER — Emergency Department (HOSPITAL_COMMUNITY): Payer: Medicaid Other

## 2015-10-26 ENCOUNTER — Inpatient Hospital Stay (HOSPITAL_COMMUNITY): Admission: RE | Admit: 2015-10-26 | Payer: Medicaid Other | Source: Ambulatory Visit

## 2015-10-26 LAB — D-DIMER, QUANTITATIVE (NOT AT ARMC): D DIMER QUANT: 1.33 ug{FEU}/mL — AB (ref 0.00–0.50)

## 2015-10-26 MED ORDER — TRAMADOL HCL 50 MG PO TABS: 50.0000 mg | ORAL_TABLET | Freq: Four times a day (QID) | ORAL | Status: DC | PRN

## 2015-10-26 NOTE — Discharge Instructions (Signed)
You need an ultrasound of her leg to rule out blood clot. [facility] and tell them you are there for a vascular study.  General Headache Without Cause A headache is pain or discomfort felt around the head or neck area. The specific cause of a headache may not be found. There are many causes and types of headaches. A few common ones are:  Tension headaches.  Migraine headaches.  Cluster headaches.  Chronic daily headaches. HOME CARE INSTRUCTIONS  Watch your condition for any changes. Take these steps to help with your condition: Managing Pain  Take over-the-counter and prescription medicines only as told by your health care provider.  Lie down in a dark, quiet room when you have a headache.  If directed, apply ice to the head and neck area:  Put ice in a plastic bag.  Place a towel between your skin and the bag.  Leave the ice on for 20 minutes, 2-3 times per day.  Use a heating pad or hot shower to apply heat to the head and neck area as told by your health care provider.  Keep lights dim if bright lights bother you or make your headaches worse. Eating and Drinking  Eat meals on a regular schedule.  Limit alcohol use.  Decrease the amount of caffeine you drink, or stop drinking caffeine. General Instructions  Keep all follow-up visits as told by your health care provider. This is important.  Keep a headache journal to help find out what may trigger your headaches. For example, write down:  What you eat and drink.  How much sleep you get.  Any change to your diet or medicines.  Try massage or other relaxation techniques.  Limit stress.  Sit up straight, and do not tense your muscles.  Do not use tobacco products, including cigarettes, chewing tobacco, or e-cigarettes. If you need help quitting, ask your health care provider.  Exercise regularly as told by your health care provider.  Sleep on a regular schedule. Get  7-9 hours of sleep, or the amount recommended by your health care provider. SEEK MEDICAL CARE IF:   Your symptoms are not helped by medicine.  You have a headache that is different from the usual headache.  You have nausea or you vomit.  You have a fever. SEEK IMMEDIATE MEDICAL CARE IF:   Your headache becomes severe.  You have repeated vomiting.  You have a stiff neck.  You have a loss of vision.  You have problems with speech.  You have pain in the eye or ear.  You have muscular weakness or loss of muscle control.  You lose your balance or have trouble walking.  You feel faint or pass out.  You have confusion.   This information is not intended to replace advice given to you by your health care provider. Make sure you discuss any questions you have with your health care provider.   Document Released: 05/08/2005 Document Revised: 01/27/2015 Document Reviewed: 08/31/2014 Elsevier Interactive Patient Education [employer]2016 Elsevier Inc.

## 2015-10-26 NOTE — ED Notes (Signed)
Patient able to ambulate independently  

## 2015-12-02 ENCOUNTER — Encounter: Payer: Self-pay | Admitting: Obstetrics

## 2015-12-02 ENCOUNTER — Ambulatory Visit (INDEPENDENT_AMBULATORY_CARE_PROVIDER_SITE_OTHER): Payer: Medicaid Other | Admitting: Obstetrics

## 2015-12-02 NOTE — Progress Notes (Signed)
Subjective:        Angela [name] is a 30 y.o. female here for a routine exam 6 weeks postpartum.  Current complaints: none.    Personal health questionnaire:  Is patient Ashkenazi Jewish, have a family history of breast and/or ovarian cancer: no Is there a family history of uterine cancer diagnosed at age < 7250, gastrointestinal cancer, urinary tract cancer, family member who is a Personnel officerLynch syndrome-associated carrier: no Is the patient overweight and hypertensive, family history of diabetes, personal history of gestational diabetes, preeclampsia or PCOS: no Is patient over 2955, have PCOS,  family history of premature CHD under age 32, diabetes, smoke, have hypertension or peripheral artery disease:  no At any time, has a partner hit, kicked or otherwise hurt or frightened you?: no Over the past 2 weeks, have you felt down, depressed or hopeless?: no Over the past 2 weeks, have you felt little interest or pleasure in doing things?:no   Gynecologic History Patient's last menstrual period was 10/17/2015 (approximate). Contraception: oral progesterone-only contraceptive Last Pap: 2016. Results were: normal Last mammogram: n/a. Results were: n/a  Obstetric History OB History  Gravida Para Term Preterm AB SAB TAB Ectopic Multiple Living  7 4 4  0 3 3 0 0 0 4    # Outcome Date GA Lbr Len/2nd Weight Sex Delivery Anes PTL Lv  7 Term 08/27/15 2037w1d  5 lb 14.5 oz (2.68 kg) F CS-LTranv Spinal  Y  6 Term 05/27/12 5976w4d  7 lb 10.4 oz (3.47 kg) M CS-LTranv Spinal  Y  5 Term 11/25/09    F CS-LTranv   Y  4 Term 07/29/02    Genella MechM Vag-Spont   Y  3 SAB           2 SAB           1 SAB               Past Medical History  Diagnosis Date  . Anemia   . No pertinent past medical history   . Headache(784.0)   . Sickle cell trait [facility](HCC)     Past Surgical History  Procedure Laterality Date  . Cesarean section    . Cesarean section  05/27/2012    Procedure: CESAREAN SECTION;  Surgeon: Antionette CharLisa  Jackson-Moore, MD;  Location: [facility];  Service: Obstetrics;  Laterality: N/A;  Repeat cesarean section with delivery of baby boy at [id number].   Marland Kitchen. Cesarean section N/A 08/27/2015    Procedure: CESAREAN SECTION;  Surgeon: Brock Badharles A Khadija Thier, MD;  Location: [facility];  Service: Obstetrics;  Laterality: N/A;     Current outpatient prescriptions:  .  norethindrone (MICRONOR,CAMILA,ERRIN) 0.35 MG tablet, Take 1 tablet (0.35 mg total) by mouth daily., Disp: 1 Package, Rfl: 11 .  Prenatal Vit-Fe Phos-FA-Omega (VITAFOL GUMMIES) 3.33-0.333-34.8 MG CHEW, Chew 3 tablets by mouth daily., Disp: 90 tablet, Rfl: 12 No Known Allergies  Social History  Substance Use Topics  . Smoking status: Never Smoker   . Smokeless tobacco: Never Used  . Alcohol Use: No    Family History  Problem Relation Age of Onset  . Other Neg Hx   . Cancer Father   . Cancer Paternal Aunt       Review of Systems  Constitutional: negative for fatigue and weight loss Respiratory: negative for cough and wheezing Cardiovascular: negative for chest pain, fatigue and palpitations Gastrointestinal: negative for abdominal pain and change in bowel habits Musculoskeletal:negative for myalgias Neurological: negative for gait problems and tremors  Behavioral/Psych: negative for abusive relationship, depression Endocrine: negative for temperature intolerance   Genitourinary:negative for abnormal menstrual periods, genital lesions, hot flashes, sexual problems and vaginal discharge Integument/breast: negative for breast lump, breast tenderness, nipple discharge and skin lesion(s)    Objective:       BP 101/64 mmHg  Pulse 56  Temp(Src) 98.6 F (37 C)  Wt 179 lb (81.194 kg)  LMP 10/17/2015 (Approximate)  Breastfeeding? Yes General:   alert  Skin:   no rash or abnormalities  Lungs:   clear to auscultation bilaterally  Heart:   regular rate and rhythm, S1, S2 normal, no murmur, click, rub or gallop  Breasts:   normal without suspicious  masses, skin or nipple changes or axillary nodes  Abdomen:  normal findings: no organomegaly, soft, non-tender and no hernia  Pelvis:  External genitalia: normal general appearance Urinary system: urethral meatus normal and bladder without fullness, nontender Vaginal: normal without tenderness, induration or masses Cervix: normal appearance Adnexa: normal bimanual exam Uterus: anteverted and non-tender, normal size   Lab Review Urine pregnancy test Labs reviewed yes Radiologic studies reviewed no     Assessment:    Healthy female exam.    Plan:    Education reviewed: calcium supplements, depression evaluation, low fat, low cholesterol diet, self breast exams and weight bearing exercise. Contraception: oral progesterone-only contraceptive. Follow up in: 6 weeks.  Annual.  No orders of the defined types were placed in this encounter.   No orders of the defined types were placed in this encounter.

## 2015-12-09 ENCOUNTER — Ambulatory Visit: Payer: Medicaid Other | Admitting: Obstetrics

## 2016-01-13 ENCOUNTER — Ambulatory Visit: Payer: Medicaid Other | Admitting: Obstetrics

## 2016-10-08 ENCOUNTER — Other Ambulatory Visit: Payer: Self-pay | Admitting: Obstetrics

## 2016-10-08 DIAGNOSIS — Z30011 Encounter for initial prescription of contraceptive pills: Secondary | ICD-10-CM

## 2016-10-12 ENCOUNTER — Telehealth: Payer: Self-pay

## 2016-10-12 ENCOUNTER — Other Ambulatory Visit: Payer: Self-pay | Admitting: Obstetrics

## 2016-10-12 DIAGNOSIS — Z30011 Encounter for initial prescription of contraceptive pills: Secondary | ICD-10-CM

## 2016-10-12 MED ORDER — NORETHINDRONE 0.35 MG PO TABS: 1.0000 | ORAL_TABLET | Freq: Every day | ORAL | 2 refills | Status: DC

## 2016-10-12 NOTE — Telephone Encounter (Signed)
Pt came into the office requesting a refill for her ocp. Pt has an annual scheduled 6/7. [facility] to [facility]

## 2016-10-26 ENCOUNTER — Ambulatory Visit: Payer: Medicaid Other | Admitting: Obstetrics

## 2016-11-13 ENCOUNTER — Ambulatory Visit: Payer: Medicaid Other | Admitting: Obstetrics

## 2017-07-08 IMAGING — US US MFM FETAL BPP W/O NON-STRESS
1 series · 15 of 23 positions shown · non-contrast
Comparison: none

[Series 1: us mfm fetal bpp w/o non-stress · 23 acquisitions, 15 frames shown]
[im 1/23]
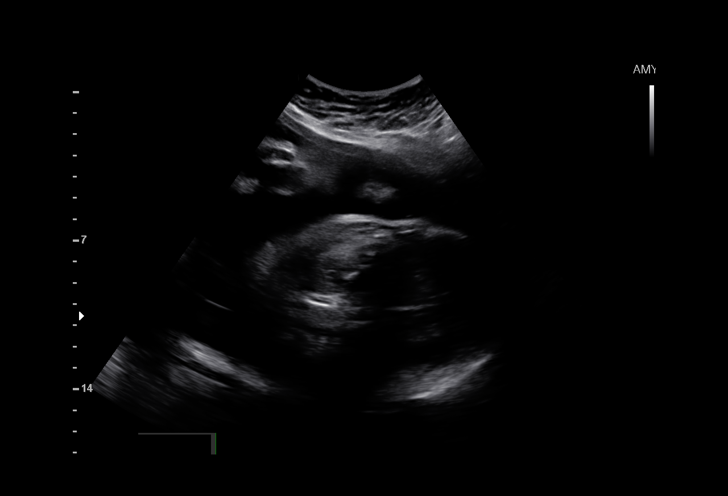
[im 3/23]
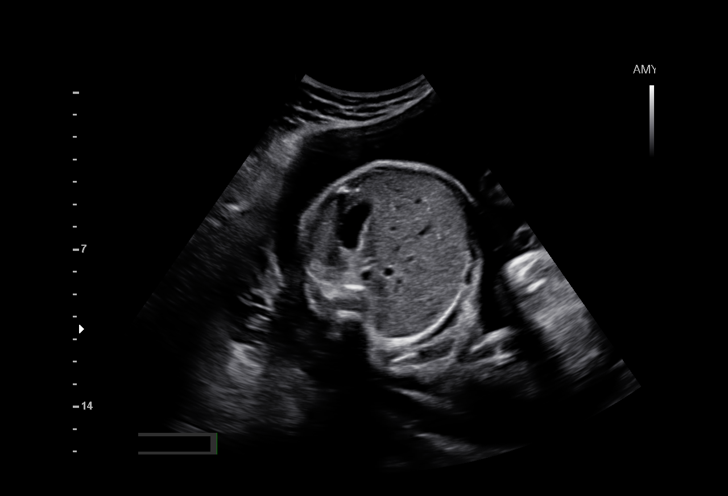
[im 4/23]
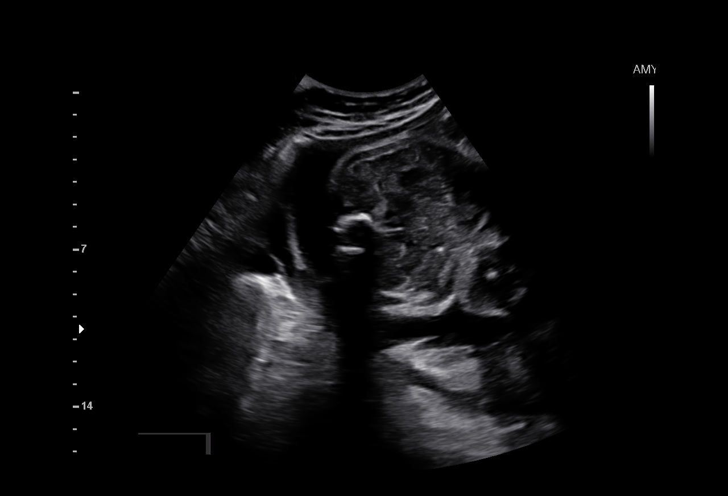
[im 6/23]
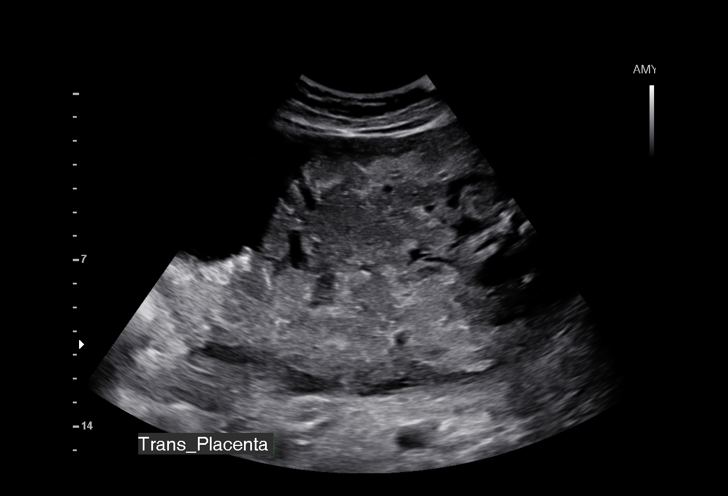
[im 7/23]
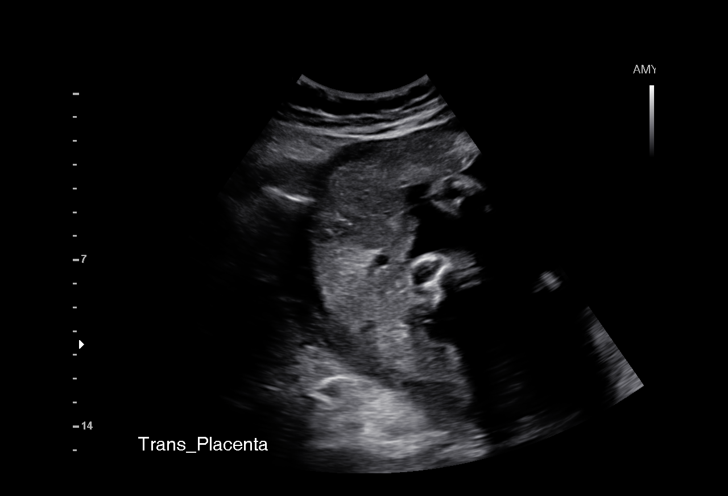
[im 9/23]
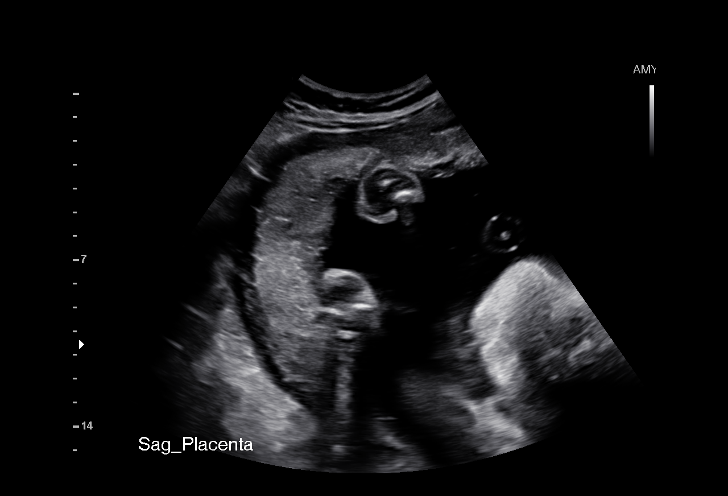
[im 10/23]
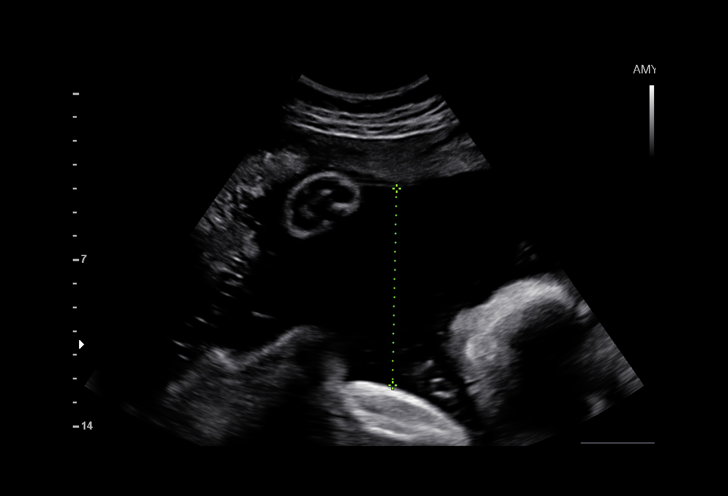
[im 12/23]
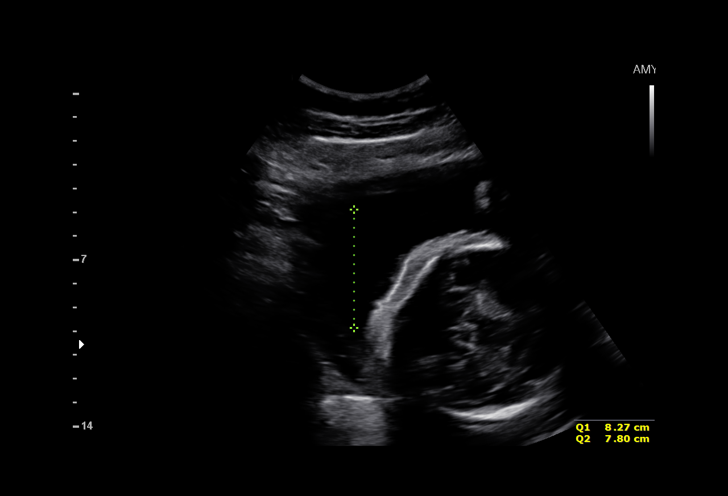
[im 14/23]
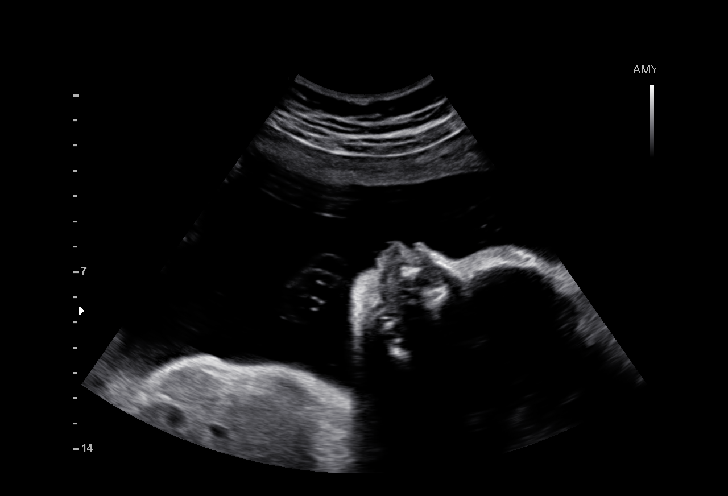
[im 15/23]
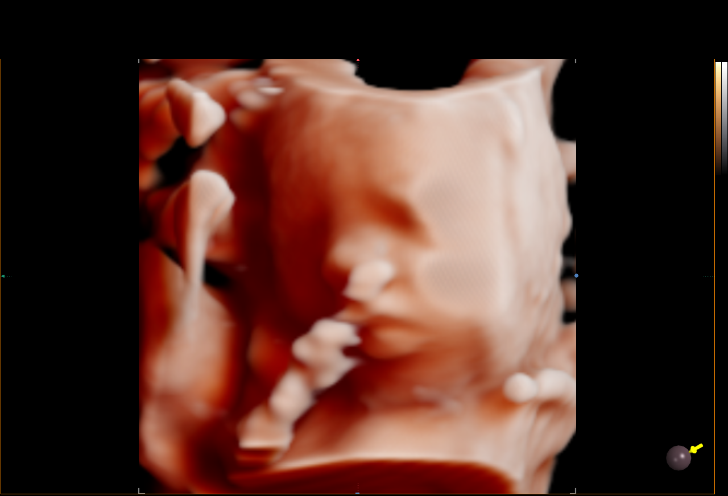
[im 17/23]
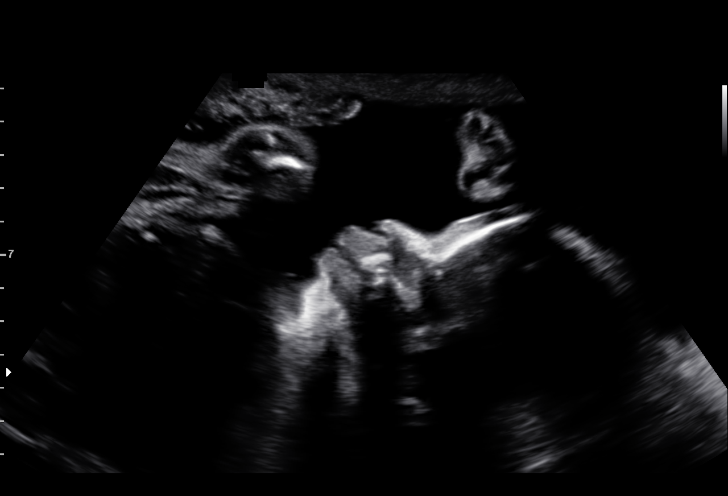
[im 18/23]
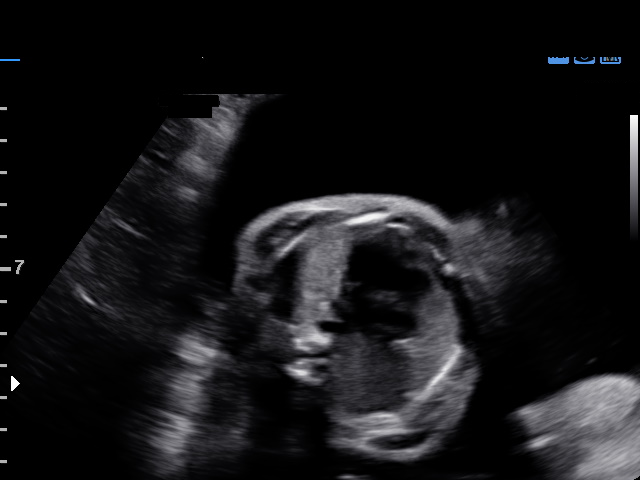
[im 20/23]
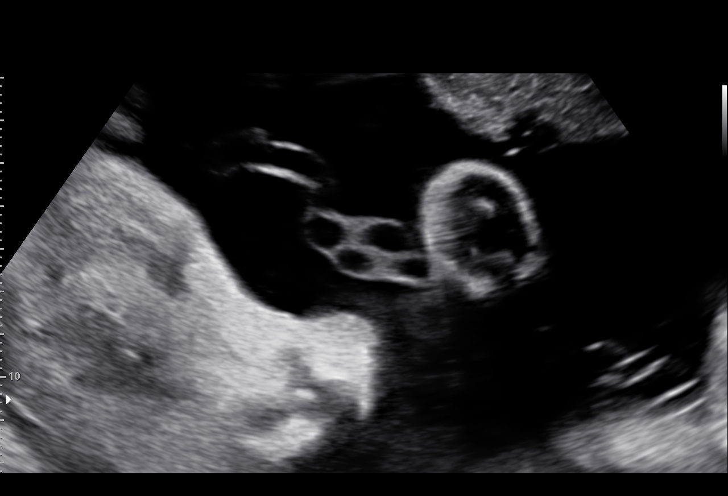
[im 21/23]
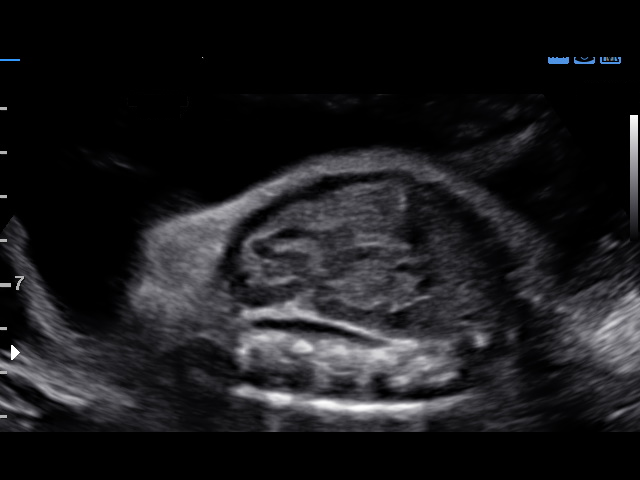
[im 23/23]
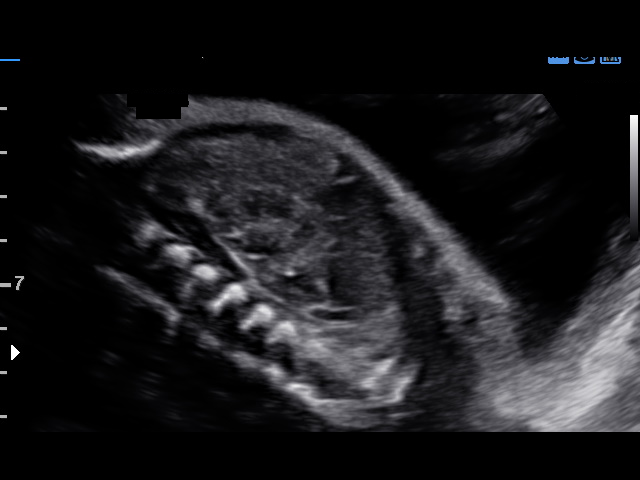

[15 of 23 positions shown; findings below may reference images not displayed]

pm)

Name:       ROSHNI MUELLER                       Visit  07/08/2015 [DATE]
Date:

1  LUKOVIC SACCO            [id number]       [id number]     [id number]
Indications

Medical complication of pregnancy (mother
w/ sickle cell trait)
Abnormal biochemical screen (quad) for
Trisomy 18 ([DATE]); low risk NIPS
History of cesarean delivery (x2); currently
pregnant
2 vessel umbilical cord
Polyhydramnios, third trimester,
antepartum condition or complication,
unspecified fetus
32 weeks gestation of pregnancy
OB History

Gravidity:     7         Term:  3        Prem:    0        SAB:   0
TOP:           3       Ectopic  0        Living:  3
:
Fetal Evaluation

Num Of Fetuses:      1
Fetal Heart          157
Rate(bpm):
Cardiac Activity:    Observed
Presentation:        Cephalic
Placenta:            Fundal, above cervical os
P. Cord Insertion:   Previously Visualized

Amniotic Fluid
AFI FV:      Polyhydramnios
AFI Sum:     26.83    cm      97  %Tile     Larg Pckt:    8.27   cm
RUQ:   8.27    cm    RLQ:   5.79    cm   LUQ:    7.8     cm   LLQ:    4.97   cm
Biophysical Evaluation

Amniotic F.V:   Within normal limits        F. Tone:        Observed
F. Movement:    Observed                    Score:          [DATE]
F. Breathing:   Observed
Gestational Age

LMP:           32w 0d        Date:  11/26/14                  EDD:   09/02/15
Best:          32w 0d    Det. By:   LMP  (11/26/14)           EDD:   09/02/15
Cervix Uterus Adnexa

Cervix
Not visualized (advanced GA >59wks)
Impression

Single IUP at 32w 0d
Single umbilical artery, mild polyhydramnios
BPP [DATE]
Mild polyhydramnios noted (AFI 26.8 cm)
Recommendations

Continue weekly BPPs
Ultrasound for growth in 2 weeks

## 2017-07-22 IMAGING — US US MFM OB FOLLOW-UP
1 series · 12 of 28 positions shown · non-contrast
Comparison: none

[Series 1: us mfm ob follow-up · 0.32mm/px · 12 of 31 slices shown]
[im 2/31]
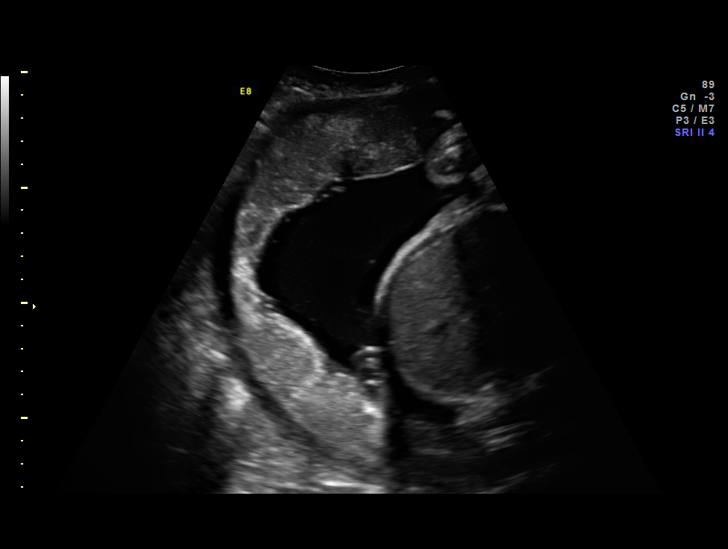
[im 4/31]
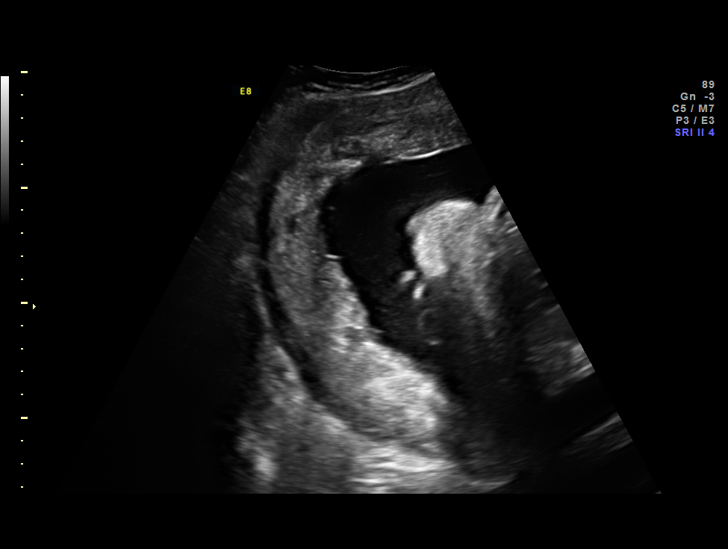
[im 6/31]
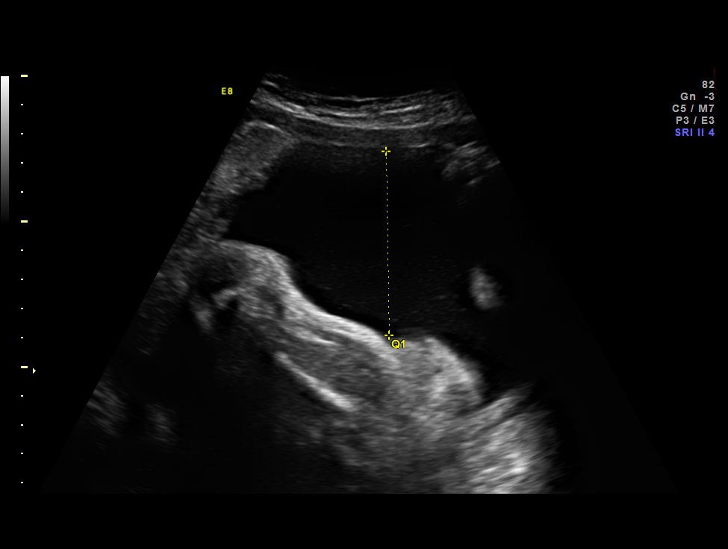
[im 9/31]
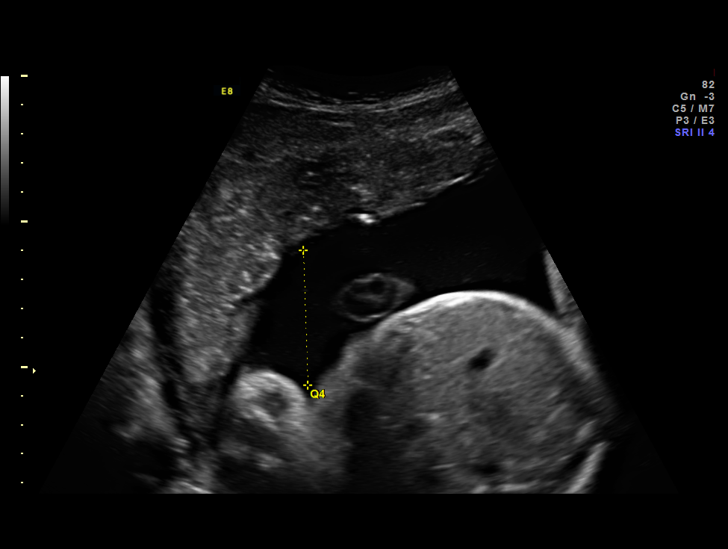
[im 12/31]
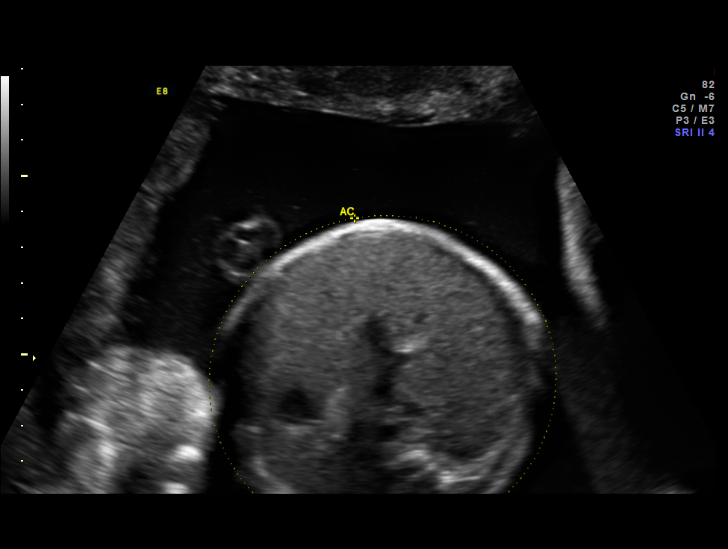
[im 14/31]
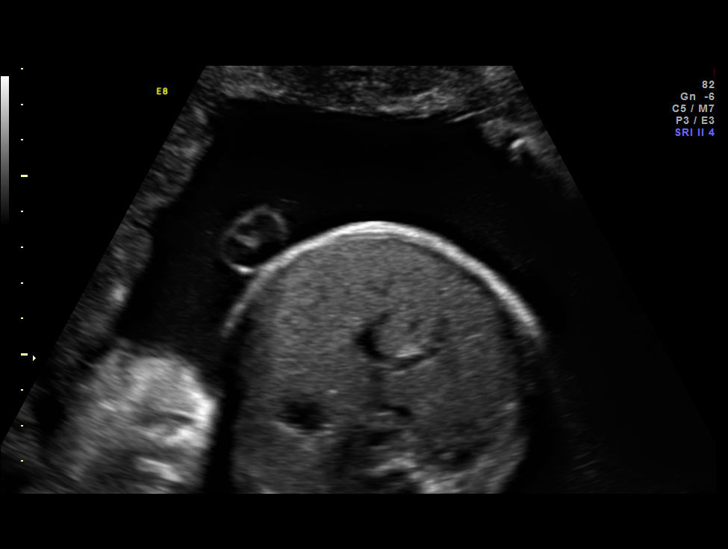
[im 17/31]
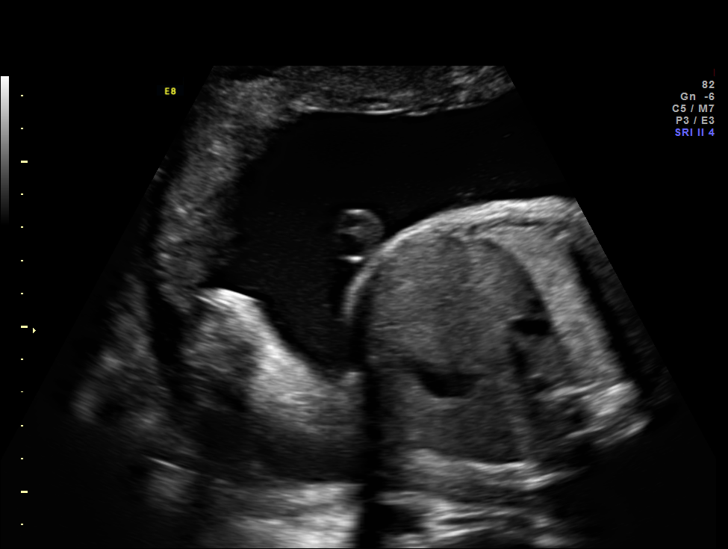
[im 19/31]
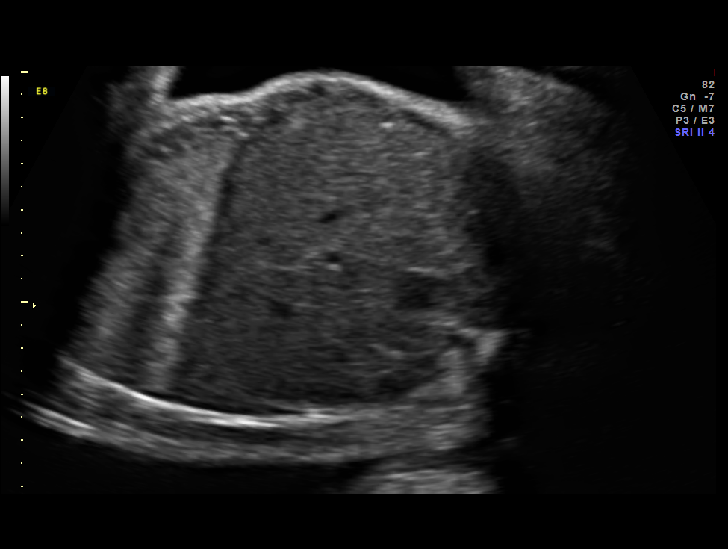
[im 22/31]
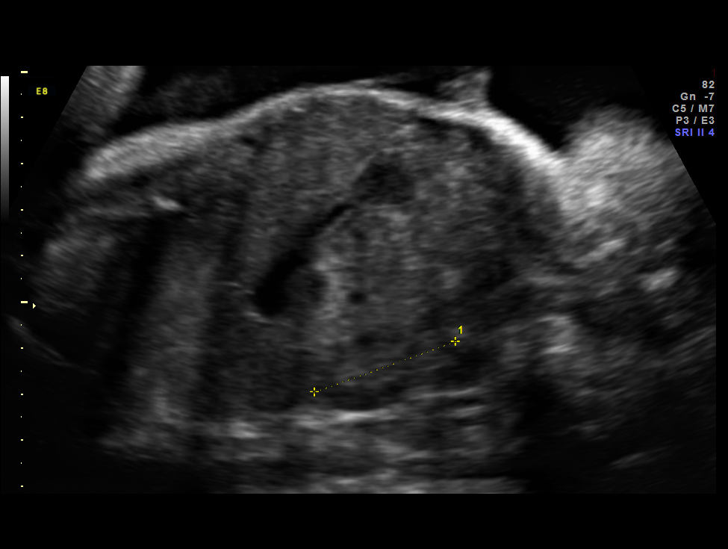
[im 25/31]
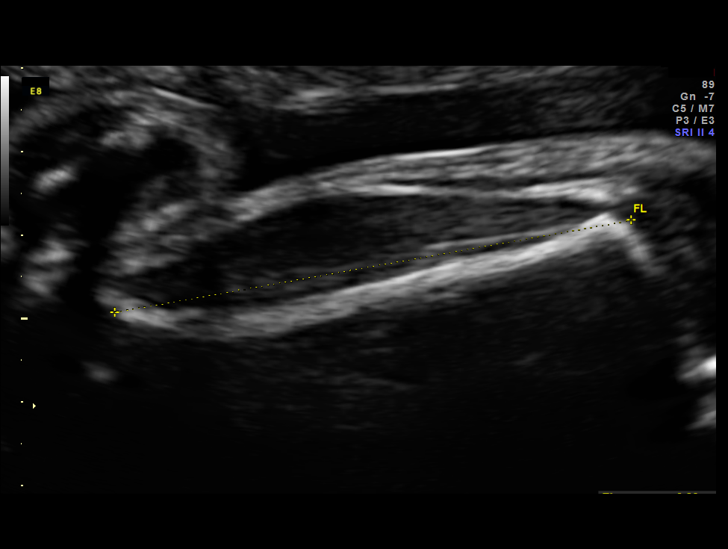
[im 27/31]
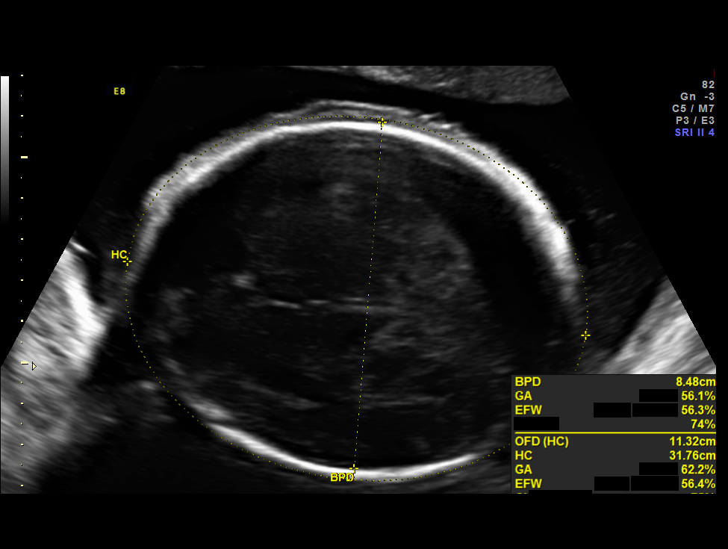
[im 29/31]
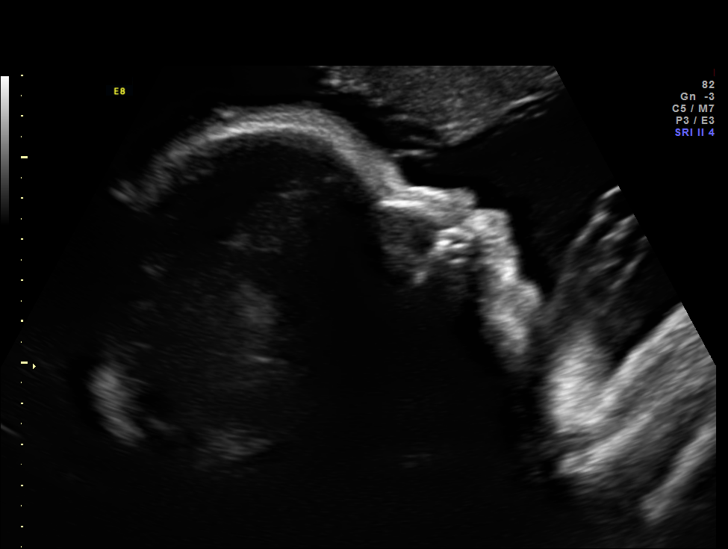

[12 of 28 positions shown; findings below may reference images not displayed]

Road [HOSPITAL]

1  RALF-STIVEN SAUGA            [id number]      [id number]     [id number]
2  RALF-STIVEN SAUGA            [id number]      [id number]     [id number]
Indications

Medical complication of pregnancy (mother
w/ sickle cell trait)
Abnormal biochemical screen (quad) for
Trisomy 18 ([DATE]); low risk NIPS
History of cesarean delivery (x2); currently
pregnant
2 vessel umbilical cord
Polyhydramnios, third trimester, antepartum
condition or complication, unspecified fetus
OB History

Gravidity:    7         Term:   3        Prem:   0        SAB:   0
TOP:          3       Ectopic:  0        Living: 3
Fetal Evaluation

Num Of Fetuses:     1
Fetal Heart         139
Rate(bpm):
Cardiac Activity:   Observed
Presentation:       Transverse, head to maternal right
Placenta:           Fundal, above cervical os
P. Cord Insertion:  Previously Visualized

Amniotic Fluid
AFI FV:      Polyhydramnios
AFI Sum:     27.39   cm      97   %Tile     Larg Pckt:  10.35   cm
RUQ:   6.33   cm    RLQ:    4.64   cm    LUQ:   10.35   cm   LLQ:    6.07   cm
Biophysical Evaluation

Amniotic F.V:   Within normal limits       F. Tone:        Observed
F. Movement:    Observed                   Score:          [DATE]
F. Breathing:   Observed
Biometry

BPD:        85  mm     G. Age:  34w 1d                  CI:         75.5   %   70 - 86
OFD:     112.6  mm                                      FL/HC:      20.1   %   19.4 -
HC:      317.7  mm     G. Age:  35w 5d         58  %    HC/AC:      1.07       0.96 -
AC:      297.8  mm     G. Age:  33w 5d         47  %    FL/BPD:     75.1   %   71 - 87
FL:       63.8  mm     G. Age:  33w 0d         17  %    FL/AC:      21.4   %   20 - 24
HUM:      55.3  mm     G. Age:  32w 1d         20  %

Est. FW:    5587  gm          5 lb      55  %
Gestational Age

LMP:           34w 0d       Date:   11/26/14                 EDD:   09/02/15
U/S Today:     34w 1d                                        EDD:   09/01/15
Best:          34w 0d    Det. By:   LMP  (11/26/14)          EDD:   09/02/15
Anatomy

Cranium:          Previously seen        Aortic Arch:      Previously seen
Fetal Cavum:      Previously seen        Ductal Arch:      Previously seen
Ventricles:       Previously seen        Diaphragm:        Appears normal
Choroid Plexus:   Previously seen        Stomach:          Appears normal, left
sided
Cerebellum:       Previously seen        Abdomen:          Previously seen
Posterior Fossa:  Previously seen        Abdominal Wall:   Previously seen
Nuchal Fold:      Previously seen        Cord Vessels:     2 vessel cord,
absent Jannah Castellon
Face:             Orbits and profile     Kidneys:          Appear normal
previously seen
Lips:             Previously seen        Bladder:          Appears normal
Fetal Thoracic:   Previously seen        Spine:            Previously seen
Heart:            Previously seen        Upper             Previously seen
Extremities:
RVOT:             Previously seen        Lower             Previously seen
Extremities:
LVOT:             Previously seen

Other:  Fetus appears to be a female.
Cervix Uterus Adnexa

Cervix
Not visualized (advanced GA >84wks)
Impression

Single IUP at 34w 0d
Follow up due to single umbilical artery, polyhydramnios
The estimated fetal weight is at the 55th %tile
BPP [DATE]
Mild polyhydramnios is again noted with an AFI of 27 cm.
Recommendations

Continue weekly BPPs
Ultrasound for growth in 4 weeks if undelivered
Recommend delivery at 39 weeks in the absence of other
complications

## 2017-08-12 IMAGING — US US MFM FETAL BPP W/O NON-STRESS
1 series · 15 of 15 positions shown · non-contrast
Comparison: none

[Series 1: us mfm fetal bpp w/o non-stress · 15 acquisitions, 15 frames shown]
[im 1/15]
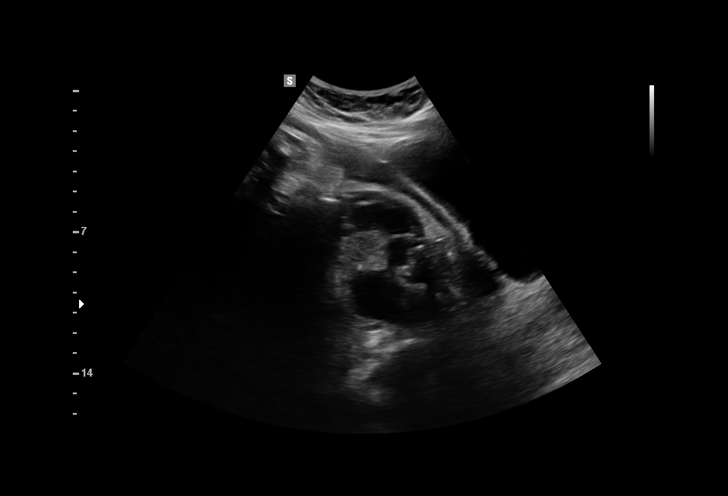
[im 2/15]
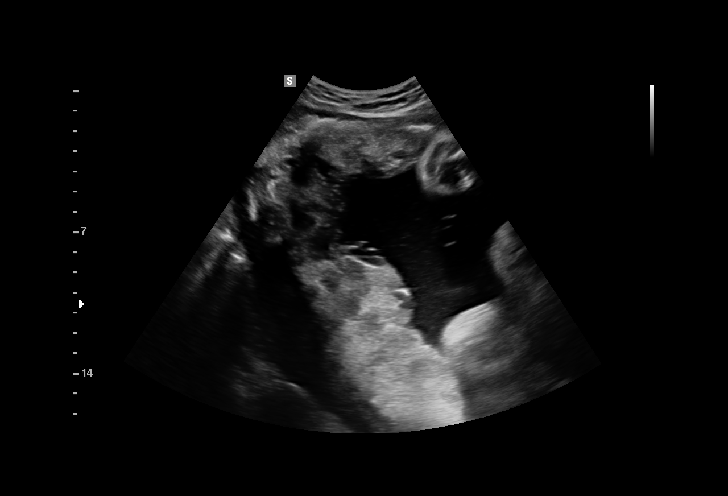
[im 3/15]
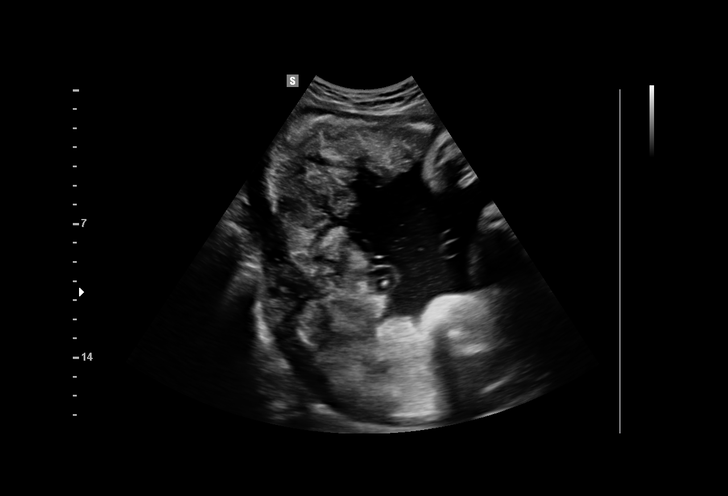
[im 4/15]
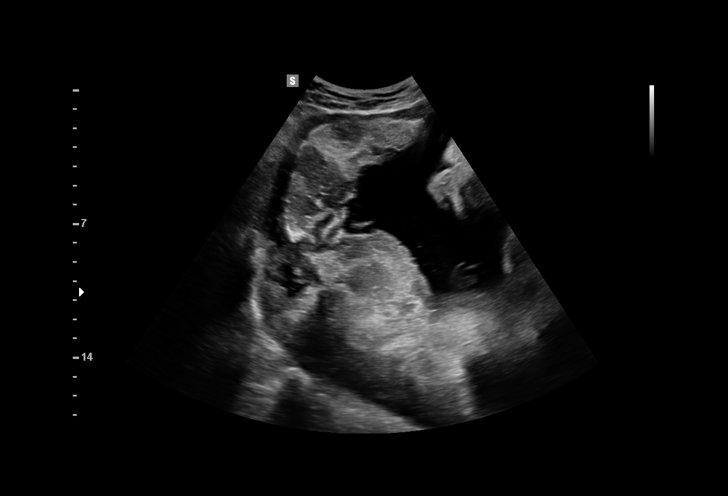
[im 5/15]
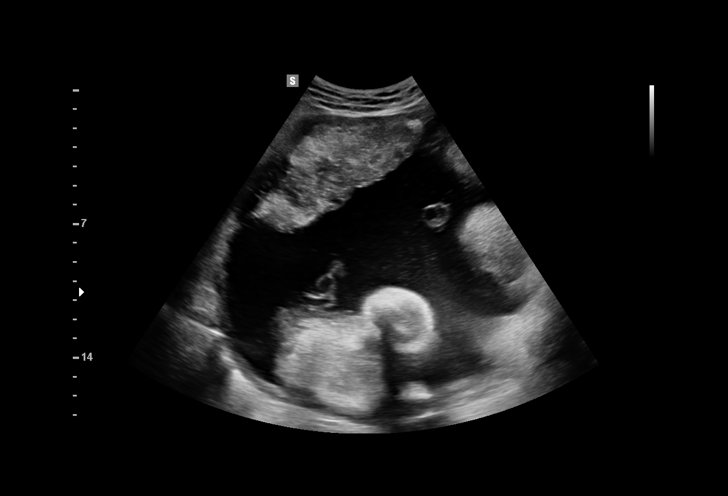
[im 6/15]
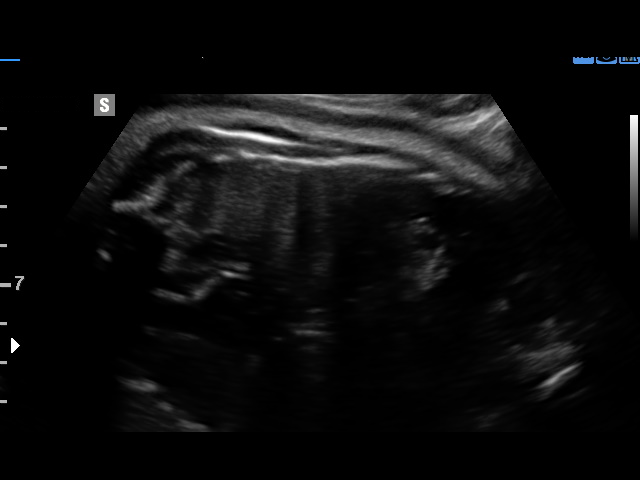
[im 7/15]
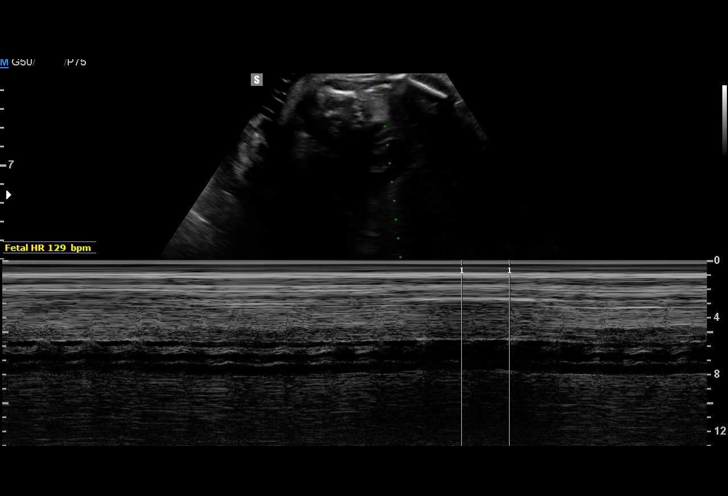
[im 8/15]
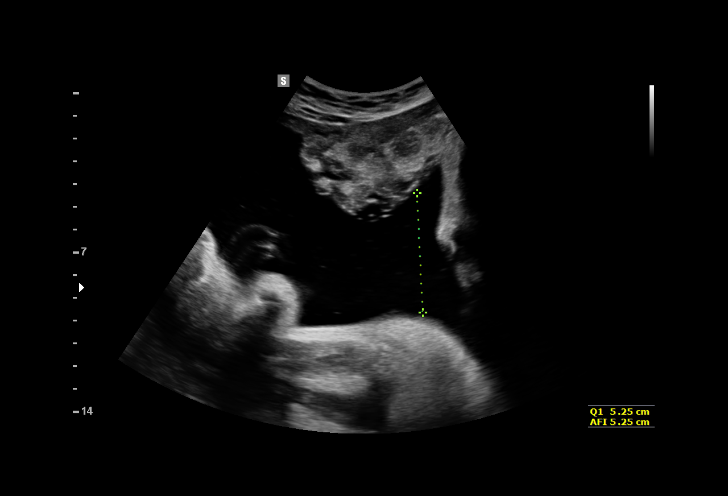
[im 9/15]
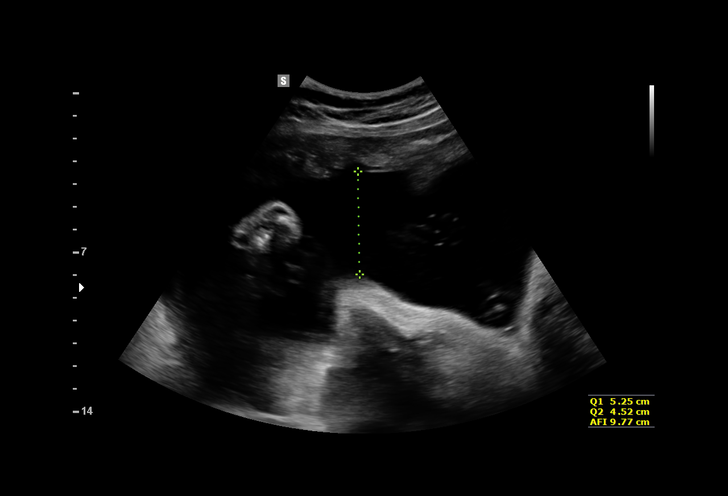
[im 10/15]
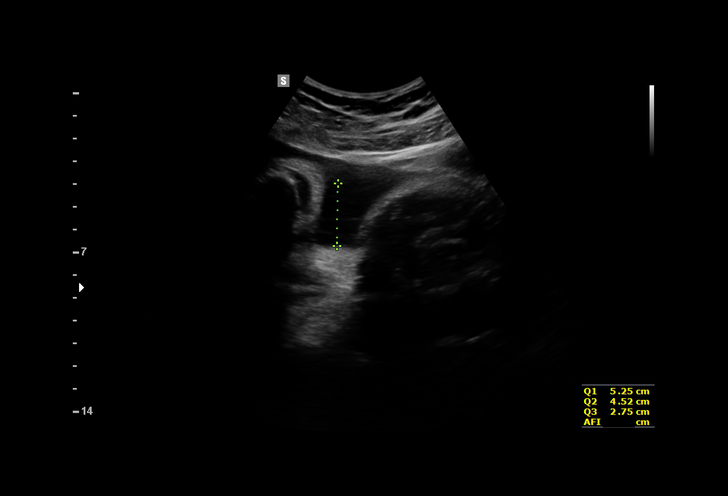
[im 11/15]
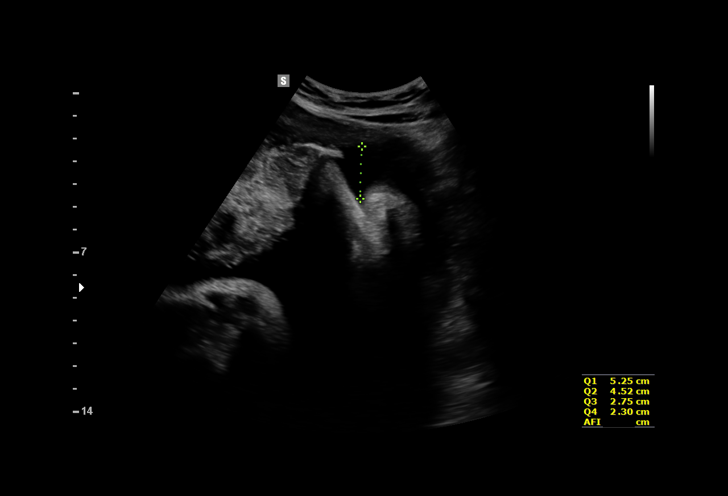
[im 12/15]
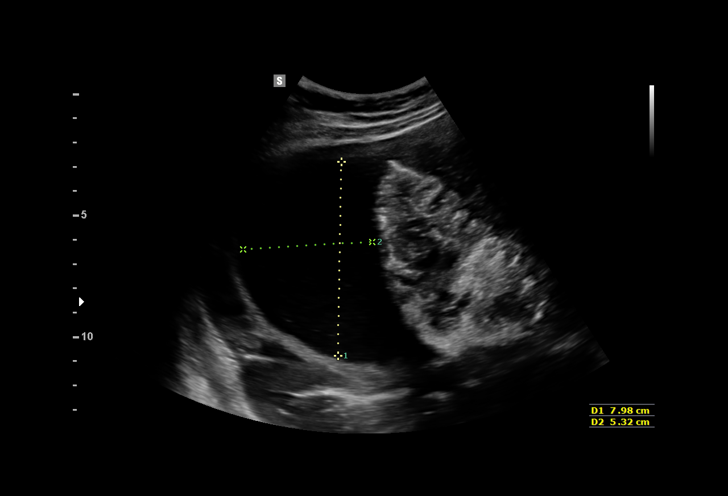
[im 13/15]
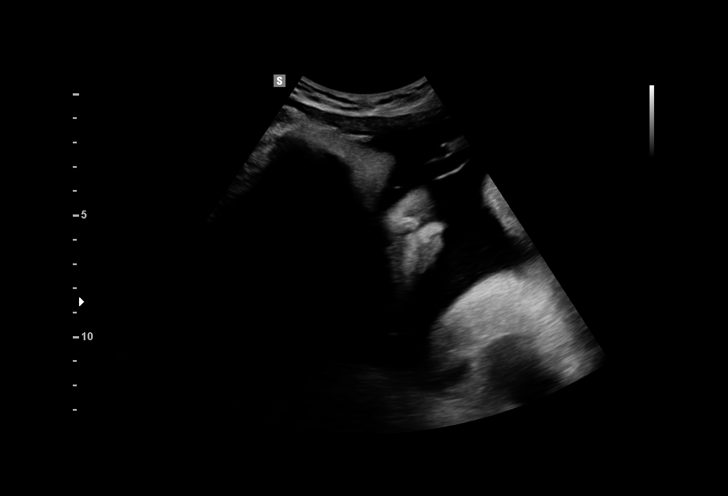
[im 14/15]
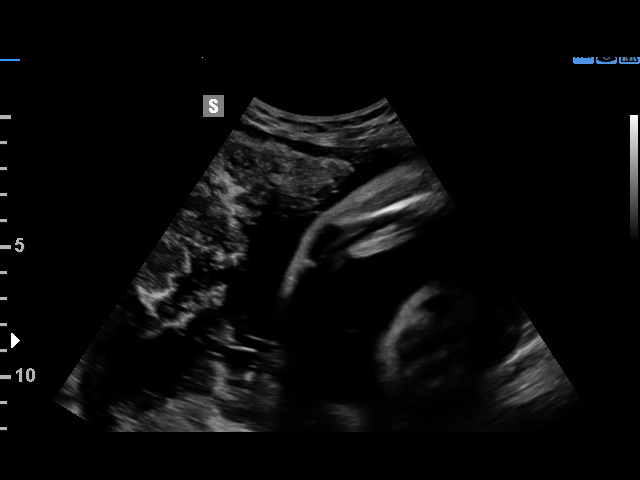
[im 15/15]
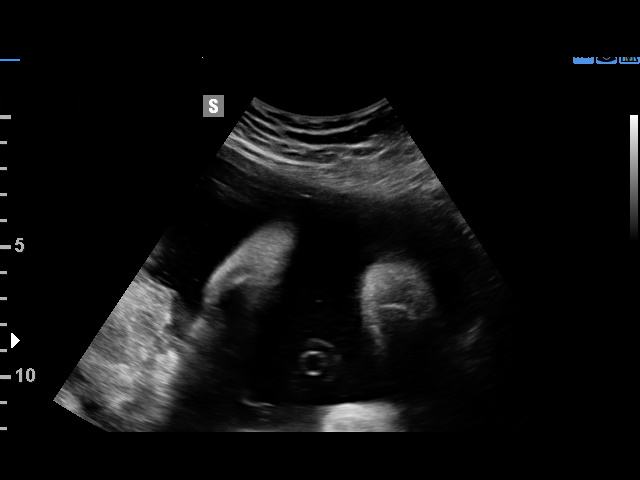

[15 of 15 positions shown; findings below may reference images not displayed]

Road [HOSPITAL]

1  HAKWON SAFWATY            [id number]      [id number]     [id number]
Indications

Medical complication of pregnancy (mother
w/ sickle cell trait)
Abnormal biochemical screen (quad) for
Trisomy 18 ([DATE]); low risk NIPS
History of cesarean delivery (x2); currently
pregnant
2 vessel umbilical cord
Polyhydramnios, third trimester, antepartum
condition or complication, unspecified fetus
37 weeks gestation of pregnancy
OB History

Gravidity:    7         Term:   3        Prem:   0        SAB:   0
TOP:          3       Ectopic:  0        Living: 3
Fetal Evaluation

Num Of Fetuses:     1
Fetal Heart         129
Rate(bpm):
Cardiac Activity:   Observed
Presentation:       Breech
Placenta:           Fundal, above cervical os
P. Cord Insertion:  Previously Visualized

Amniotic Fluid
AFI FV:      Subjectively within normal limits
AFI Sum:     14.82   cm      55   %Tile     Larg Pckt:   5.25   cm
RUQ:   5.25   cm    RLQ:    2.3    cm    LUQ:   4.52    cm   LLQ:    2.75   cm
Biophysical Evaluation

Amniotic F.V:   Within normal limits       F. Tone:        Observed
F. Movement:    Observed                   Score:          [DATE]
F. Breathing:   Observed
Gestational Age

LMP:           37w 0d       Date:   11/26/14                 EDD:   09/02/15
Best:          37w 0d    Det. By:   LMP  (11/26/14)          EDD:   09/02/15
Cervix Uterus Adnexa

Cervix
Not visualized (advanced GA >75wks)
Impression

SIUP at 37+0 weeks
Breech presentation
Normal amniotic fluid volume
BPP [DATE]
Recommendations

Follow-up as clinically indicated

## 2020-07-06 ENCOUNTER — Telehealth: Payer: Medicaid Other | Admitting: Physician Assistant

## 2020-07-06 ENCOUNTER — Other Ambulatory Visit: Payer: Self-pay

## 2020-07-06 VITALS — BP 115/66 | HR 87 | Temp 98.7°F | Resp 18 | Ht 65.0 in

## 2020-07-06 DIAGNOSIS — N926 Irregular menstruation, unspecified: Secondary | ICD-10-CM

## 2020-07-06 DIAGNOSIS — N3000 Acute cystitis without hematuria: Secondary | ICD-10-CM

## 2020-07-06 DIAGNOSIS — R110 Nausea: Secondary | ICD-10-CM

## 2020-07-06 DIAGNOSIS — R509 Fever, unspecified: Secondary | ICD-10-CM | POA: Diagnosis not present

## 2020-07-06 DIAGNOSIS — Z3202 Encounter for pregnancy test, result negative: Secondary | ICD-10-CM

## 2020-07-06 LAB — POCT URINALYSIS DIPSTICK
Bilirubin, UA: NEGATIVE
Blood, UA: NEGATIVE
Glucose, UA: NEGATIVE
Ketones, UA: NEGATIVE
Nitrite, UA: NEGATIVE
Protein, UA: NEGATIVE
Spec Grav, UA: 1.025 (ref 1.010–1.025)
Urobilinogen, UA: 1 E.U./dL
pH, UA: 6.5 (ref 5.0–8.0)

## 2020-07-06 LAB — POCT INFLUENZA A/B
Influenza A, POC: NEGATIVE
Influenza B, POC: NEGATIVE

## 2020-07-06 LAB — POC COVID19 BINAXNOW: SARS Coronavirus 2 Ag: NEGATIVE

## 2020-07-06 LAB — POCT URINE PREGNANCY: Preg Test, Ur: NEGATIVE

## 2020-07-06 MED ORDER — NITROFURANTOIN MONOHYD MACRO 100 MG PO CAPS: 100.0000 mg | ORAL_CAPSULE | Freq: Two times a day (BID) | ORAL | 0 refills | Status: AC

## 2020-07-06 MED ORDER — ONDANSETRON 8 MG PO TBDP: 8.0000 mg | ORAL_TABLET | Freq: Three times a day (TID) | ORAL | 0 refills | Status: DC | PRN

## 2020-07-06 NOTE — Patient Instructions (Addendum)
Your rapid COVID test was negative  We are treating you for urinary tract infection.  You will take Macrobid twice a day for the next 7 days.  I encourage you to increase your water intake, and get plenty of rest.  I also sent Zofran to help you with the nausea  I hope that you feel better soon, please let us know if there is any else we can do for you   Roney Jaffe, PA-C Physician Assistant [facility] Medicine [email or web address]   Urinary Tract Infection, Adult  A urinary tract infection (UTI) is an infection of any part of the urinary tract. The urinary tract includes the kidneys, ureters, bladder, and urethra. These organs make, store, and get rid of urine in the body. An upper UTI affects the ureters and kidneys. A lower UTI affects the bladder and urethra. What are the causes? Most urinary tract infections are caused by bacteria in your genital area around your urethra, where urine leaves your body. These bacteria grow and cause inflammation of your urinary tract. What increases the risk? You are more likely to develop this condition if:  You have a urinary catheter that stays in place.  You are not able to control when you urinate or have a bowel movement (incontinence).  You are female and you: ? Use a spermicide or diaphragm for birth control. ? Have low estrogen levels. ? Are pregnant.  You have certain genes that increase your risk.  You are sexually active.  You take antibiotic medicines.  You have a condition that causes your flow of urine to slow down, such as: ? An enlarged prostate, if you are female. ? Blockage in your urethra. ? A kidney stone. ? A nerve condition that affects your bladder control (neurogenic bladder). ? Not getting enough to drink, or not urinating often.  You have certain medical conditions, such as: ? Diabetes. ? A weak disease-fighting system (immunesystem). ? Sickle cell  disease. ? Gout. ? Spinal cord injury. What are the signs or symptoms? Symptoms of this condition include:  Needing to urinate right away (urgency).  Frequent urination. This may include small amounts of urine each time you urinate.  Pain or burning with urination.  Blood in the urine.  Urine that smells bad or unusual.  Trouble urinating.  Cloudy urine.  Vaginal discharge, if you are female.  Pain in the abdomen or the lower back. You may also have:  Vomiting or a decreased appetite.  Confusion.  Irritability or tiredness.  A fever or chills.  Diarrhea. The first symptom in older adults may be confusion. In some cases, they may not have any symptoms until the infection has worsened. How is this diagnosed? This condition is diagnosed based on your medical history and a physical exam. You may also have other tests, including:  Urine tests.  Blood tests.  Tests for STIs (sexually transmitted infections). If you have had more than one UTI, a cystoscopy or imaging studies may be done to determine the cause of the infections. How is this treated? Treatment for this condition includes:  Antibiotic medicine.  Over-the-counter medicines to treat discomfort.  Drinking enough water to stay hydrated. If you have frequent infections or have other conditions such as a kidney stone, you may need to see a health care provider who specializes in the urinary tract (urologist). In rare cases, urinary tract infections can cause sepsis. Sepsis is a life-threatening condition that occurs when the body responds to an infection.  Sepsis is treated in the hospital with IV antibiotics, fluids, and other medicines. Follow these instructions at home: Medicines  Take over-the-counter and prescription medicines only as told by your health care provider.  If you were prescribed an antibiotic medicine, take it as told by your health care provider. Do not stop using the antibiotic even if  you start to feel better. General instructions  Make sure you: ? Empty your bladder often and completely. Do not hold urine for long periods of time. ? Empty your bladder after sex. ? Wipe from front to back after urinating or having a bowel movement if you are female. Use each tissue only one time when you wipe.  Drink enough fluid to keep your urine pale yellow.  Keep all follow-up visits. This is important.   Contact a health care provider if:  Your symptoms do not get better after 1-2 days.  Your symptoms go away and then return. Get help right away if:  You have severe pain in your back or your lower abdomen.  You have a fever or chills.  You have nausea or vomiting. Summary  A urinary tract infection (UTI) is an infection of any part of the urinary tract, which includes the kidneys, ureters, bladder, and urethra.  Most urinary tract infections are caused by bacteria in your genital area.  Treatment for this condition often includes antibiotic medicines.  If you were prescribed an antibiotic medicine, take it as told by your health care provider. Do not stop using the antibiotic even if you start to feel better.  Keep all follow-up visits. This is important. This information is not intended to replace advice given to you by your health care provider. Make sure you discuss any questions you have with your health care provider. Document Revised: 12/19/2019 Document Reviewed: 12/19/2019 Elsevier Patient Education  2021 [employer].

## 2020-07-06 NOTE — Progress Notes (Signed)
Patient verified DOB Patient complains of body aches and bilateral abdominal pain with the left side being more prominent beginning on yesterday. Pain is currently scaled at a 10.  Patient shares there is discomfort when urinating. Patient has used naprosyn and midol with minimal relief.
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
## 2020-07-09 LAB — URINE CULTURE

## 2021-05-26 ENCOUNTER — Ambulatory Visit (INDEPENDENT_AMBULATORY_CARE_PROVIDER_SITE_OTHER): Payer: Medicaid Other

## 2021-05-26 ENCOUNTER — Other Ambulatory Visit: Payer: Self-pay

## 2021-05-26 VITALS — BP 103/68 | HR 90 | Ht 63.0 in | Wt 223.0 lb

## 2021-05-26 DIAGNOSIS — N912 Amenorrhea, unspecified: Secondary | ICD-10-CM

## 2021-05-26 DIAGNOSIS — Z3201 Encounter for pregnancy test, result positive: Secondary | ICD-10-CM

## 2021-05-26 LAB — POCT URINE PREGNANCY: Preg Test, Ur: POSITIVE — AB

## 2021-05-26 NOTE — Progress Notes (Signed)
Patient was assessed and managed by nursing staff during this encounter. I have reviewed the chart and agree with the documentation and plan. I have also made any necessary editorial changes.  Catalina Antigua, MD 05/26/2021 4:49 PM

## 2021-05-26 NOTE — Progress Notes (Signed)
Angela [name] presents today for UPT. She has no unusual complaints. LMP: 04/09/2021    OBJECTIVE: Appears well, in no apparent distress.  OB History     Gravida  8   Para  4   Term  4   Preterm  0   AB  3   Living  4      SAB  3   IAB  0   Ectopic  0   Multiple  0   Live Births  4          Home UPT Result: NA In-Office UPT result:POSITIVE  I have reviewed the patient's medical, obstetrical, social, and family histories, and medications.   ASSESSMENT: Positive pregnancy test LMP  04/09/2021 EDD  01/14/2022 GA     [redacted]w[redacted]d  PLAN Prenatal care to be completed at: [facility]

## 2021-06-17 ENCOUNTER — Ambulatory Visit (INDEPENDENT_AMBULATORY_CARE_PROVIDER_SITE_OTHER): Payer: Medicaid Other

## 2021-06-17 ENCOUNTER — Other Ambulatory Visit: Payer: Self-pay

## 2021-06-17 VITALS — BP 117/74 | HR 80 | Ht 63.0 in | Wt 230.4 lb

## 2021-06-17 DIAGNOSIS — O0990 Supervision of high risk pregnancy, unspecified, unspecified trimester: Secondary | ICD-10-CM | POA: Insufficient documentation

## 2021-06-17 DIAGNOSIS — O3680X0 Pregnancy with inconclusive fetal viability, not applicable or unspecified: Secondary | ICD-10-CM

## 2021-06-17 DIAGNOSIS — Z3481 Encounter for supervision of other normal pregnancy, first trimester: Secondary | ICD-10-CM | POA: Diagnosis not present

## 2021-06-17 DIAGNOSIS — Z3490 Encounter for supervision of normal pregnancy, unspecified, unspecified trimester: Secondary | ICD-10-CM | POA: Insufficient documentation

## 2021-06-17 MED ORDER — BLOOD PRESSURE KIT DEVI: 1.0000 | 0 refills | Status: DC

## 2021-06-17 MED ORDER — VITAFOL GUMMIES 3.33-0.333-34.8 MG PO CHEW: 3.0000 | CHEWABLE_TABLET | Freq: Every day | ORAL | 11 refills | Status: AC

## 2021-06-17 NOTE — Progress Notes (Signed)
New OB Intake  I connected with  Angela [name] on 06/17/21 at  8:15 AM EST by in person and verified that I am speaking with the correct person using two identifiers. Nurse is located at [facility] and pt is located at [facility].  I discussed the limitations, risks, security and privacy concerns of performing an evaluation and management service by telephone and the availability of in person appointments. I also discussed with the patient that there may be a patient responsible charge related to this service. The patient expressed understanding and agreed to proceed.  I explained I am completing New OB Intake today. We discussed her EDD of 01/03/22 that is based on early u/s. Pt is G8/P4034. I reviewed her allergies, medications, Medical/Surgical/OB history, and appropriate screenings. I informed her of [facility] services. Based on history, this is a/an uncomplicated pregnancy   Patient Active Problem List   Diagnosis Date Noted   Sickle cell trait (HCC) 06/09/2015    Concerns addressed today  Delivery Plans:  Plans to deliver at [facility].   MyChart/Babyscripts MyChart access verified. I explained pt will have some visits in office and some virtually. Babyscripts instructions given and order placed. Patient verifies receipt of registration text/e-mail. Account successfully created and app downloaded.  Blood Pressure Cuff  Blood pressure cuff ordered for patient to pick-up from [employer]. Explained after first prenatal appt pt will check weekly and document in Babyscripts.  Weight scale: Patient does not  have weight scale. Weight scale ordered for patient to pick up from [employer].   Anatomy US Explained first scheduled [place] will be around 19 weeks. Dating and viability scan performed today.  Labs Discussed Avelina Laine genetic screening with patient. Would like both Panorama and Horizon drawn at new OB visit. Routine prenatal labs needed.  Covid Vaccine Patient has not covid vaccine.     Informed patient of [facility] website  and placed link in her AVS.   Social Determinants of Health  Food Insecurity: Patient denies food insecurity.  WIC Referral: Patient is interested in referral to [facility].   Transportation: Patient denies transportation needs.  Childcare: Discussed no children allowed at ultrasound appointments. Offered childcare services; patient declines childcare services at this time.  Send link to Pregnancy Navigators   Placed OB Box on problem list and updated  First visit review I reviewed new OB appt with pt. I explained she will have a pelvic exam, ob bloodwork with genetic screening, and PAP smear. Explained pt will be seen by Peggy Constant at first visit; encounter routed to appropriate provider. Explained that patient will be seen by pregnancy navigator following visit with provider. [facility] information placed in AVS.   Hamilton Capri, RN 06/17/2021  8:39 AM

## 2021-07-05 ENCOUNTER — Other Ambulatory Visit: Payer: Self-pay | Admitting: Emergency Medicine

## 2021-07-05 DIAGNOSIS — Z3481 Encounter for supervision of other normal pregnancy, first trimester: Secondary | ICD-10-CM

## 2021-07-12 ENCOUNTER — Encounter: Payer: Self-pay | Admitting: Obstetrics and Gynecology

## 2021-07-12 ENCOUNTER — Other Ambulatory Visit: Payer: Self-pay

## 2021-07-12 ENCOUNTER — Other Ambulatory Visit (HOSPITAL_COMMUNITY)
Admission: RE | Admit: 2021-07-12 | Discharge: 2021-07-12 | Disposition: A | Discharge status: Home / Self Care | Payer: Medicaid Other | Source: Ambulatory Visit | Attending: Obstetrics and Gynecology | Admitting: Obstetrics and Gynecology

## 2021-07-12 ENCOUNTER — Ambulatory Visit (INDEPENDENT_AMBULATORY_CARE_PROVIDER_SITE_OTHER): Payer: Medicaid Other | Admitting: Obstetrics and Gynecology

## 2021-07-12 VITALS — BP 128/84 | HR 102 | Wt 229.5 lb

## 2021-07-12 DIAGNOSIS — O09529 Supervision of elderly multigravida, unspecified trimester: Secondary | ICD-10-CM | POA: Insufficient documentation

## 2021-07-12 DIAGNOSIS — O99210 Obesity complicating pregnancy, unspecified trimester: Secondary | ICD-10-CM | POA: Diagnosis not present

## 2021-07-12 DIAGNOSIS — O09522 Supervision of elderly multigravida, second trimester: Secondary | ICD-10-CM | POA: Diagnosis not present

## 2021-07-12 DIAGNOSIS — O0990 Supervision of high risk pregnancy, unspecified, unspecified trimester: Secondary | ICD-10-CM

## 2021-07-12 DIAGNOSIS — O34219 Maternal care for unspecified type scar from previous cesarean delivery: Secondary | ICD-10-CM | POA: Diagnosis not present

## 2021-07-12 MED ORDER — DOXYLAMINE-PYRIDOXINE 10-10 MG PO TBEC: 2.0000 | DELAYED_RELEASE_TABLET | Freq: Every day | ORAL | 5 refills | Status: DC

## 2021-07-12 MED ORDER — PROMETHAZINE HCL 25 MG PO TABS: 25.0000 mg | ORAL_TABLET | Freq: Four times a day (QID) | ORAL | 2 refills | Status: DC | PRN

## 2021-07-12 MED ORDER — ASPIRIN EC 81 MG PO TBEC: 81.0000 mg | DELAYED_RELEASE_TABLET | Freq: Every day | ORAL | 2 refills | Status: DC

## 2021-07-12 NOTE — Addendum Note (Signed)
Addended by: Hamilton Capri on: 07/12/2021 03:50 PM   Modules accepted: Orders

## 2021-07-12 NOTE — Patient Instructions (Signed)

## 2021-07-12 NOTE — Progress Notes (Signed)
Presents for initial OB visit. Reports n/v 4-5x daily, abdominal cramping. Desires genetic screen.
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
## 2021-07-13 LAB — CBC/D/PLT+RPR+RH+ABO+RUBIGG...
Antibody Screen: NEGATIVE
Basophils Absolute: 0 10*3/uL (ref 0.0–0.2)
Basos: 0 %
EOS (ABSOLUTE): 0 10*3/uL (ref 0.0–0.4)
Eos: 1 %
HCV Ab: NONREACTIVE
HIV Screen 4th Generation wRfx: NONREACTIVE
Hematocrit: 42.3 % (ref 34.0–46.6)
Hemoglobin: 13.6 g/dL (ref 11.1–15.9)
Hepatitis B Surface Ag: NEGATIVE
Immature Grans (Abs): 0 10*3/uL (ref 0.0–0.1)
Immature Granulocytes: 0 %
Lymphocytes Absolute: 1.8 10*3/uL (ref 0.7–3.1)
Lymphs: 23 %
MCH: 27.3 pg (ref 26.6–33.0)
MCHC: 32.2 g/dL (ref 31.5–35.7)
MCV: 85 fL (ref 79–97)
Monocytes Absolute: 0.8 10*3/uL (ref 0.1–0.9)
Monocytes: 10 %
Neutrophils Absolute: 4.9 10*3/uL (ref 1.4–7.0)
Neutrophils: 66 %
Platelets: 338 10*3/uL (ref 150–450)
RBC: 4.99 x10E6/uL (ref 3.77–5.28)
RDW: 14.4 % (ref 11.7–15.4)
RPR Ser Ql: NONREACTIVE
Rh Factor: POSITIVE
Rubella Antibodies, IGG: 4.44 index (ref >0.99)
WBC: 7.5 10*3/uL (ref 3.4–10.8)

## 2021-07-13 LAB — HEMOGLOBIN A1C
Est. average glucose Bld gHb Est-mCnc: 111 mg/dL
Hgb A1c MFr Bld: 5.5 % (ref 4.8–5.6)

## 2021-07-13 LAB — HCV INTERPRETATION

## 2021-07-14 LAB — CERVICOVAGINAL ANCILLARY ONLY
Chlamydia: NEGATIVE
Comment: NEGATIVE
Comment: NEGATIVE
Comment: NORMAL
Neisseria Gonorrhea: NEGATIVE
Trichomonas: NEGATIVE

## 2021-07-15 LAB — CULTURE, OB URINE

## 2021-07-15 LAB — CYTOLOGY - PAP
Comment: NEGATIVE
Diagnosis: NEGATIVE
High risk HPV: NEGATIVE

## 2021-07-15 LAB — URINE CULTURE, OB REFLEX

## 2021-07-18 ENCOUNTER — Encounter: Payer: Self-pay | Admitting: Obstetrics and Gynecology

## 2021-07-18 MED ORDER — CEPHALEXIN 500 MG PO CAPS: 500.0000 mg | ORAL_CAPSULE | Freq: Four times a day (QID) | ORAL | 2 refills | Status: DC

## 2021-07-18 NOTE — Addendum Note (Signed)
Addended by: Catalina Antigua on: 07/18/2021 08:36 AM   Modules accepted: Orders

## 2021-07-28 ENCOUNTER — Telehealth: Payer: Self-pay

## 2021-07-28 ENCOUNTER — Encounter: Payer: Self-pay | Admitting: Obstetrics and Gynecology

## 2021-07-28 ENCOUNTER — Other Ambulatory Visit: Payer: Self-pay

## 2021-07-28 DIAGNOSIS — D563 Thalassemia minor: Secondary | ICD-10-CM | POA: Insufficient documentation

## 2021-07-28 DIAGNOSIS — D573 Sickle-cell trait: Secondary | ICD-10-CM

## 2021-07-28 NOTE — Telephone Encounter (Signed)
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 Patient informed that she will be referred to genetic counseling and that she will receive a call regarding her appointment. Patient verbalized understanding. ? ?Referral placed. ?

## 2021-08-08 ENCOUNTER — Ambulatory Visit: Payer: Medicaid Other | Admitting: *Deleted

## 2021-08-08 ENCOUNTER — Ambulatory Visit: Payer: Medicaid Other | Attending: Obstetrics and Gynecology

## 2021-08-08 ENCOUNTER — Ambulatory Visit (HOSPITAL_BASED_OUTPATIENT_CLINIC_OR_DEPARTMENT_OTHER): Payer: Medicaid Other

## 2021-08-08 ENCOUNTER — Ambulatory Visit: Payer: Self-pay | Admitting: Genetics

## 2021-08-08 ENCOUNTER — Other Ambulatory Visit: Payer: Self-pay

## 2021-08-08 ENCOUNTER — Other Ambulatory Visit: Payer: Self-pay | Admitting: *Deleted

## 2021-08-08 VITALS — BP 131/58 | HR 80

## 2021-08-08 DIAGNOSIS — O99210 Obesity complicating pregnancy, unspecified trimester: Secondary | ICD-10-CM

## 2021-08-08 DIAGNOSIS — D573 Sickle-cell trait: Secondary | ICD-10-CM | POA: Insufficient documentation

## 2021-08-08 DIAGNOSIS — O34219 Maternal care for unspecified type scar from previous cesarean delivery: Secondary | ICD-10-CM

## 2021-08-08 DIAGNOSIS — O09522 Supervision of elderly multigravida, second trimester: Secondary | ICD-10-CM | POA: Diagnosis present

## 2021-08-08 DIAGNOSIS — O0990 Supervision of high risk pregnancy, unspecified, unspecified trimester: Secondary | ICD-10-CM

## 2021-08-08 DIAGNOSIS — Z98891 History of uterine scar from previous surgery: Secondary | ICD-10-CM

## 2021-08-08 DIAGNOSIS — D571 Sickle-cell disease without crisis: Secondary | ICD-10-CM

## 2021-08-08 DIAGNOSIS — Z6838 Body mass index (BMI) 38.0-38.9, adult: Secondary | ICD-10-CM

## 2021-08-08 DIAGNOSIS — D563 Thalassemia minor: Secondary | ICD-10-CM

## 2021-08-08 NOTE — Progress Notes (Addendum)
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   ?EDC: 01/03/2022 LMP: 04/09/2021 Referring Provider:  ?Angela Antigua, MD  ?EGA: [redacted]w[redacted]d Genetic Counselor: ?Angela Dunk, MS, CGC  ?OB Hx: [id number] Date of Appointment: 08/08/2021  ?Accompanied by: Father of the current pregnancy, Angela [name] Face to Face Time: 30 Minutes  ? ?Previous Testing Completed: ?Angela [name] previously completed Non-Invasive Prenatal Screening (NIPS) in this pregnancy. The result is low risk, consistent with a female fetus. This screening significantly reduces the risk that the current pregnancy has Down syndrome, Trisomy 15, Trisomy 49, and common sex chromosome conditions, however, the risk is not zero given the limitations of NIPS. Additionally, there are many genetic conditions that cannot be detected by NIPS.  ?[name] previously completed carrier screening. She screened to be a silent carrier for alpha thalassemia (aa/a-) and a carrier for Sickle Cell Disease (Hb A/S). She screened to not be a carrier for Cystic Fibrosis (CF) and Spinal Muscular Atrophy (SMA). A negative result on carrier screening reduces the likelihood of being a carrier, however, does not entirely rule out the possibility.  ? ?Medical History:  ?This is Angela [name]'s 8th pregnancy. She has 3 living children from previous partners. She has had 3 early losses from previous partners.  ?Reports she takes prenatal vitamins, low dose Aspirin, and medication for nausea. ?Denies personal history of diabetes, high blood pressure, thyroid conditions, and seizures. ?Denies bleeding, infections, and fevers in this pregnancy. ?Denies using tobacco, alcohol, or street drugs in this pregnancy.  ? ?Family History: A pedigree was created and scanned into Epic under the Media tab. ?Maternal ethnicity reported as [place] and paternal ethnicity reported as African American. ?Denies Ashkenazi  Jewish ancestry. ?Family history not remarkable for consanguinity, individuals with birth defects, intellectual disability, autism spectrum disorder, still births, or unexplained neonatal death.  ?   ?Genetic Counseling:  ? ?Silent Carrier for Alpha Thalassemia. Angela [name] is a silent carrier for alpha thalassemia (??/?-). Positive for the pathogenic alpha 3.7 deletion of the HBA2 gene. With Angela [name]'s alpha thalassemia screening result, we know that she has three working copies of the alpha-globin genes while the 4th alpha-globin gene is deleted. Each of Angela [name]'s children will either inherit two functional copies or one functional copy with one deletion from her. Angela [name] is not at an increased risk to have a baby with fetal hydrops due to Hemoglobin Barts disease (--/--) regardless of Angela [name]'s carrier status. We discussed there would be a 25% risk for the current pregnancy to be affected with Hemoglobin H disease (--/?-) if Angela [name] is found to be an alpha thalassemia carrier in the cis configuration (??/--). Clinical features of Hemoglobin H disease are highly variable and generally develop in the first years of life. The primary symptoms include moderate anemia with marked microcytosis, jaundice, and hepatosplenomegaly. Some affected individuals do not require blood transfusions while others may require occasional blood transfusions throughout their lifetime. Because Angela [name] is a silent carrier for alpha thalassemia (??/?-), carrier screening for Angela [name] was offered to determine risk for the current pregnancy. We reviewed with the couple that if Angela [name] is found to be a carrier for alpha thalassemia, given his African American ancestry, it is more likely for him to be a silent carrier (??/?-) or a carrier in the trans configuration (?-/?-) as the cis configuration (??/--) has been reported very rarely in individuals with African American ancestry. If Angela [name] is a non-carrier (??/??), a silent carrier (??/?-),  or a  carrier in the trans configuration (?-/?-) there would not be an increased risk for the pregnancy to have Hemoglobin H disease. Angela [name] appeared to understand the information discussed and declined carrier screening for Alpha Thalassemia. ? ?Carrier of Sickle Cell Disease. Angela [name] is a carrier of sickle cell trait (Hemoglobin S: Hb A/S). Hemoglobin S is a structural hemoglobin variant that replaces one of the ?-chains of hemoglobin. It is caused by mutations in the HBB gene. Carriers of sickle cell trait typically do not have any clinical features, however, may experience sickling under extreme conditions. Individuals who carry sickle cell trait would have a 25% risk to have offspring affected with Sickle Cell Disease if their reproductive partner is also found to be a carrier for a ?-globin chain abnormality (recessive inheritance). ?Sickle Cell Disease caused by the homozygous HBB variant p.Glu6Val (Hb S/S), is the most common cause of Sickle Cell Disease in the [place]. ?Other Sickle Cell Disorders caused by compound heterozygous HBB pathogenic variants includes Sickle-Hemoglobin C Disease (Hb S/C) and two types of Sickle ?-Thalassemia (Hb S/?+ and Hb S/??). ?Other ?-globin chain variants such as D-Punjab, O-Arab, and E also result in Sickle Cell Disorders when inherited with HbS.  ?Individuals with Sickle Cell Disease are healthy at birth and become symptomatic later in infancy or childhood after fetal hemoglobin levels decrease. Symptoms include infants with spontaneous painful swelling of the hands/feet, recurrent episodes of severe pain with no other identified etiology, unexplained anemia not related to iron deficiency, pallor, jaundice, pneumococcal sepsis or meningitis, severe anemia with splenic enlargement, childhood stroke, etc. Given that Donn is a carrier of sickle cell trait (Hb A/S) genetic counseling offered screening for Angela [name] for ? Hemoglobinopathies. Angela [name] appeared to understand the  information discussed and declined carrier screening for ? Hemoglobinopathies. Angela [name] reports he has been tested before and reports his testing was negative. The results from his testing are not available for genetic counseling to review at this time. We reviewed that Sickle Cell Disease is one of the conditions included in [place]'s newborn screening program. ? ?Birth Defects. All babies have approximately a 3-5% risk for a birth defect and a majority of these defects cannot be detected through the screening or diagnostic testing listed below. Ultrasound may detect some birth defects, but it may not detect all birth defects. About half of pregnancies with Down syndrome do not show any soft markers on ultrasound. A normal ultrasound does not guarantee a healthy pregnancy. ?  ? ?Testing/Screening Options:  ? ?Carrier Screening. Per the ACOG Committee Opinion 691, if an individual is found to be a carrier for a specific condition, the individual's reproductive partner should be offered testing in order to receive informed genetic counseling about potential reproductive outcomes. Genetic counseling offered Angela [name] carrier screening for Alpha Thalassemia and Beta Hemoglobinopathies given Mehreen's carrier status. ?  ?  ?Patient Plan: ? ?Proceed with: Routine prenatal care ?Informed consent was obtained. All questions were answered. ? ?Declined: Carrier Screening for Angela [name] for Alpha Thalassemia and Beta Hemoglobinopathies  ? ?Thank you for sharing in the care of Todd with [place].  ?Please do not hesitate to contact us if you have any questions. ? ?Angela Dunk, MS, CGC ?Certified Genetic Counselor ? ?

## 2021-08-09 ENCOUNTER — Encounter: Payer: Medicaid Other | Admitting: Obstetrics and Gynecology

## 2021-08-09 NOTE — Progress Notes (Signed)
Patient was assessed and managed by nursing staff during this encounter. I have reviewed the chart and agree with the documentation and plan. I have also made any necessary editorial changes.  Scheryl Darter, MD 08/09/2021 1:49 PM

## 2021-08-18 ENCOUNTER — Ambulatory Visit (INDEPENDENT_AMBULATORY_CARE_PROVIDER_SITE_OTHER): Payer: Medicaid Other | Admitting: Obstetrics and Gynecology

## 2021-08-18 VITALS — BP 122/70 | HR 88 | Wt 229.0 lb

## 2021-08-18 DIAGNOSIS — O0990 Supervision of high risk pregnancy, unspecified, unspecified trimester: Secondary | ICD-10-CM

## 2021-08-18 DIAGNOSIS — D563 Thalassemia minor: Secondary | ICD-10-CM

## 2021-08-18 DIAGNOSIS — O09522 Supervision of elderly multigravida, second trimester: Secondary | ICD-10-CM

## 2021-08-18 DIAGNOSIS — O34219 Maternal care for unspecified type scar from previous cesarean delivery: Secondary | ICD-10-CM

## 2021-08-18 DIAGNOSIS — Z3A20 20 weeks gestation of pregnancy: Secondary | ICD-10-CM

## 2021-08-18 DIAGNOSIS — O2242 Hemorrhoids in pregnancy, second trimester: Secondary | ICD-10-CM

## 2021-08-18 MED ORDER — DOCUSATE SODIUM 100 MG PO CAPS: 100.0000 mg | ORAL_CAPSULE | Freq: Two times a day (BID) | ORAL | 2 refills | Status: DC | PRN

## 2021-08-18 MED ORDER — PRAMOXINE HCL (PERIANAL) 1 % EX FOAM: 1.0000 "application " | Freq: Three times a day (TID) | CUTANEOUS | 3 refills | Status: AC | PRN

## 2021-08-18 NOTE — Progress Notes (Signed)
? ?  PRENATAL VISIT NOTE ? ?Subjective:  ?Angela [name] is a 36 y.o. [id number] at [redacted]w[redacted]d being seen today for ongoing prenatal care.  She is currently monitored for the following issues for this high-risk pregnancy and has Sickle cell trait ([place]); Supervision of high risk pregnancy, antepartum; AMA (advanced maternal age) multigravida 35+; history of c/s x 3; Maternal obesity affecting pregnancy, antepartum; Alpha thalassemia silent carrier; and Hemorrhoids during pregnancy in second trimester on their problem list. ? ?Patient doing well with no acute concerns today. She reports  symptomatic hemorrhoids .  Contractions: Not present. Vag. Bleeding: None.  Movement: Present. Denies leaking of fluid.  ? ?The following portions of the patient's history were reviewed and updated as appropriate: allergies, current medications, past family history, past medical history, past social history, past surgical history and problem list. Problem list updated. ? ?Objective:  ? ?Vitals:  ? 08/18/21 1352  ?BP: 122/70  ?Pulse: 88  ?Weight: 229 lb (103.9 kg)  ? ? ?Fetal Status: Fetal Heart Rate (bpm): 156 Fundal Height: 20 cm Movement: Present    ? ?General:  Alert, oriented and cooperative. Patient is in no acute distress.  ?Skin: Skin is warm and dry. No rash noted.   ?Cardiovascular: Normal heart rate noted  ?Respiratory: Normal respiratory effort, no problems with respiration noted  ?Abdomen: Soft, gravid, appropriate for gestational age.  Pain/Pressure: Present     ?Pelvic: Cervical exam deferred        ?Extremities: Normal range of motion.  Edema: Trace  ?Mental Status:  Normal mood and affect. Normal behavior. Normal judgment and thought content.  ? ?Assessment and Plan:  ?Pregnancy: [id number] at [redacted]w[redacted]d ? ?1. [redacted] weeks gestation of pregnancy ? ? ?2. Supervision of high risk pregnancy, antepartum ?Continue routine care ? ?- AFP, Serum, Open Spina Bifida ? ?3. Previous cesarean delivery affecting pregnancy, antepartum ?Hx of c/s x 3 ,  will need repeat c section ? ?4. Multigravida of advanced maternal age in second trimester ?Pt has f/u ultrasound on 4/17 ? ?5. Alpha thalassemia silent carrier ?Pt s/p genetic counseling ? ?6. Hemorrhoids during pregnancy in second trimester ?Pt desires removal, advised patient unless they are thrombosed that general surgery is unlikely to attempt removal. ?Increase hydration, trial of stool softeners and proctofoam for comfort ? ?- docusate sodium (COLACE) 100 MG capsule; Take 1 capsule (100 mg total) by mouth 2 (two) times daily as needed.  Dispense: 30 capsule; Refill: 2 ?- pramoxine (PROCTOFOAM) 1 % foam; Place 1 application. rectally 3 (three) times daily as needed for hemorrhoids.  Dispense: 15 g; Refill: 3 ? ?Preterm labor symptoms and general obstetric precautions including but not limited to vaginal bleeding, contractions, leaking of fluid and fetal movement were reviewed in detail with the patient. ? ?Please refer to After Visit Summary for other counseling recommendations.  ? ?Return in about 4 weeks (around 09/15/2021) for [facility], in person. ? ? ?Lynnda Shields, MD ?Faculty Attending ?Center for [place] ?  ?

## 2021-08-18 NOTE — Progress Notes (Signed)
ROB, c/o hemorrhoids and wants them surgically removed. ?

## 2021-08-20 LAB — AFP, SERUM, OPEN SPINA BIFIDA
AFP MoM: 0.95
AFP Value: 48.7 ng/mL
Gest. Age on Collection Date: 20.2 weeks
Maternal Age At EDD: 38.2 yr
OSBR Risk 1 IN: 10000
Test Results:: NEGATIVE
Weight: 229 [lb_av]

## 2021-09-05 ENCOUNTER — Other Ambulatory Visit: Payer: Self-pay | Admitting: *Deleted

## 2021-09-05 ENCOUNTER — Ambulatory Visit: Payer: Medicaid Other | Admitting: *Deleted

## 2021-09-05 ENCOUNTER — Ambulatory Visit: Payer: Medicaid Other | Attending: Obstetrics and Gynecology

## 2021-09-05 ENCOUNTER — Encounter: Payer: Self-pay | Admitting: *Deleted

## 2021-09-05 VITALS — BP 132/73 | HR 98

## 2021-09-05 DIAGNOSIS — Z98891 History of uterine scar from previous surgery: Secondary | ICD-10-CM | POA: Insufficient documentation

## 2021-09-05 DIAGNOSIS — O285 Abnormal chromosomal and genetic finding on antenatal screening of mother: Secondary | ICD-10-CM

## 2021-09-05 DIAGNOSIS — O0990 Supervision of high risk pregnancy, unspecified, unspecified trimester: Secondary | ICD-10-CM

## 2021-09-05 DIAGNOSIS — O34219 Maternal care for unspecified type scar from previous cesarean delivery: Secondary | ICD-10-CM | POA: Insufficient documentation

## 2021-09-05 DIAGNOSIS — D563 Thalassemia minor: Secondary | ICD-10-CM

## 2021-09-05 DIAGNOSIS — D571 Sickle-cell disease without crisis: Secondary | ICD-10-CM | POA: Diagnosis present

## 2021-09-05 DIAGNOSIS — D573 Sickle-cell trait: Secondary | ICD-10-CM

## 2021-09-05 DIAGNOSIS — Z6838 Body mass index (BMI) 38.0-38.9, adult: Secondary | ICD-10-CM | POA: Diagnosis present

## 2021-09-05 DIAGNOSIS — O99212 Obesity complicating pregnancy, second trimester: Secondary | ICD-10-CM

## 2021-09-05 DIAGNOSIS — E669 Obesity, unspecified: Secondary | ICD-10-CM

## 2021-09-05 DIAGNOSIS — O99210 Obesity complicating pregnancy, unspecified trimester: Secondary | ICD-10-CM | POA: Diagnosis present

## 2021-09-05 DIAGNOSIS — O09522 Supervision of elderly multigravida, second trimester: Secondary | ICD-10-CM

## 2021-09-05 DIAGNOSIS — Z3A22 22 weeks gestation of pregnancy: Secondary | ICD-10-CM

## 2021-09-15 ENCOUNTER — Encounter: Payer: Medicaid Other | Admitting: Obstetrics and Gynecology

## 2021-09-19 ENCOUNTER — Encounter: Payer: Medicaid Other | Admitting: Advanced Practice Midwife

## 2021-09-19 NOTE — Progress Notes (Deleted)
   PRENATAL VISIT NOTE  Subjective:  Angela [name] is a 36 y.o. [id number] at [redacted]w[redacted]d being seen today for ongoing prenatal care.  She is currently monitored for the following issues for this {Blank single:19197::"high-risk","low-risk"} pregnancy and has Sickle cell trait ([place]); Supervision of high risk pregnancy, antepartum; AMA (advanced maternal age) multigravida 35+; history of c/s x 3; Maternal obesity affecting pregnancy, antepartum; Alpha thalassemia silent carrier; and Hemorrhoids during pregnancy in second trimester on their problem list.  Patient reports {sx:14538}.   .  .   . Denies leaking of fluid.   The following portions of the patient's history were reviewed and updated as appropriate: allergies, current medications, past family history, past medical history, past social history, past surgical history and problem list.   Objective:  There were no vitals filed for this visit.  Fetal Status:           General:  Alert, oriented and cooperative. Patient is in no acute distress.  Skin: Skin is warm and dry. No rash noted.   Cardiovascular: Normal heart rate noted  Respiratory: Normal respiratory effort, no problems with respiration noted  Abdomen: Soft, gravid, appropriate for gestational age.        Pelvic: {Blank single:19197::"Cervical exam performed in the presence of a chaperone","Cervical exam deferred"}        Extremities: Normal range of motion.     Mental Status: Normal mood and affect. Normal behavior. Normal judgment and thought content.   Assessment and Plan:  Pregnancy: [id number] at [redacted]w[redacted]d 1. Supervision of high risk pregnancy, antepartum ***  2. Previous cesarean delivery affecting pregnancy, antepartum ***  3. Multigravida of advanced maternal age in second trimester --Growth [place] 5/30  4. Sickle cell trait (HCC) ***  5. [redacted] weeks gestation of pregnancy ***  {Blank single:19197::"Term","Preterm"} labor symptoms and general obstetric precautions including but  not limited to vaginal bleeding, contractions, leaking of fluid and fetal movement were reviewed in detail with the patient. Please refer to After Visit Summary for other counseling recommendations.   No follow-ups on file.  Future Appointments  Date Time Provider Dana  09/19/2021  3:30 PM Elvera Maria, CNM [facility] None  10/18/2021  8:15 AM [facility] NURSE [facility]  10/18/2021  8:30 AM [facility] US3 [facility]    Fatima Blank, CNM

## 2021-10-11 ENCOUNTER — Telehealth: Payer: Self-pay

## 2021-10-18 ENCOUNTER — Ambulatory Visit: Payer: Medicaid Other | Attending: Obstetrics

## 2021-10-18 ENCOUNTER — Ambulatory Visit: Payer: Medicaid Other

## 2021-11-07 ENCOUNTER — Encounter: Payer: Medicaid Other | Admitting: Obstetrics and Gynecology

## 2021-11-16 ENCOUNTER — Ambulatory Visit (INDEPENDENT_AMBULATORY_CARE_PROVIDER_SITE_OTHER): Payer: Medicaid Other | Admitting: Obstetrics and Gynecology

## 2021-11-16 ENCOUNTER — Encounter: Payer: Self-pay | Admitting: Obstetrics and Gynecology

## 2021-11-16 ENCOUNTER — Other Ambulatory Visit: Payer: Self-pay | Admitting: Family Medicine

## 2021-11-16 VITALS — BP 110/72 | HR 84 | Wt 237.0 lb

## 2021-11-16 DIAGNOSIS — O99210 Obesity complicating pregnancy, unspecified trimester: Secondary | ICD-10-CM

## 2021-11-16 DIAGNOSIS — O09523 Supervision of elderly multigravida, third trimester: Secondary | ICD-10-CM

## 2021-11-16 DIAGNOSIS — O99213 Obesity complicating pregnancy, third trimester: Secondary | ICD-10-CM

## 2021-11-16 DIAGNOSIS — O34219 Maternal care for unspecified type scar from previous cesarean delivery: Secondary | ICD-10-CM

## 2021-11-16 DIAGNOSIS — Z3A33 33 weeks gestation of pregnancy: Secondary | ICD-10-CM

## 2021-11-16 DIAGNOSIS — O0993 Supervision of high risk pregnancy, unspecified, third trimester: Secondary | ICD-10-CM

## 2021-11-16 DIAGNOSIS — O0990 Supervision of high risk pregnancy, unspecified, unspecified trimester: Secondary | ICD-10-CM

## 2021-11-16 DIAGNOSIS — O24419 Gestational diabetes mellitus in pregnancy, unspecified control: Secondary | ICD-10-CM

## 2021-11-16 MED ORDER — COMFORT FIT MATERNITY SUPP LG MISC: 0 refills | Status: DC

## 2021-11-16 NOTE — Progress Notes (Signed)
Pt states she is having hip pain and needs dental referral.

## 2021-11-16 NOTE — Progress Notes (Signed)
   PRENATAL VISIT NOTE  Subjective:  Angela [name] is a 36 y.o. [id number] at [redacted]w[redacted]d being seen today for ongoing prenatal care.  She is currently monitored for the following issues for this high-risk pregnancy and has Sickle cell trait (HCC); Supervision of high risk pregnancy, antepartum; AMA (advanced maternal age) multigravida 35+; history of c/s x 3; Maternal obesity affecting pregnancy, antepartum; Alpha thalassemia silent carrier; and Hemorrhoids during pregnancy in second trimester on their problem list.  Patient reports backache.  Contractions: Irritability. Vag. Bleeding: None.  Movement: Present. Denies leaking of fluid.   The following portions of the patient's history were reviewed and updated as appropriate: allergies, current medications, past family history, past medical history, past social history, past surgical history and problem list.   Objective:   Vitals:   11/16/21 1045  BP: 110/72  Pulse: 84  Weight: 237 lb (107.5 kg)    Fetal Status: Fetal Heart Rate (bpm): 140 Fundal Height: 33 cm Movement: Present     General:  Alert, oriented and cooperative. Patient is in no acute distress.  Skin: Skin is warm and dry. No rash noted.   Cardiovascular: Normal heart rate noted  Respiratory: Normal respiratory effort, no problems with respiration noted  Abdomen: Soft, gravid, appropriate for gestational age.  Pain/Pressure: Present     Pelvic: Cervical exam deferred        Extremities: Normal range of motion.     Mental Status: Normal mood and affect. Normal behavior. Normal judgment and thought content.   Assessment and Plan:  Pregnancy: [id number] at [redacted]w[redacted]d 1. Supervision of high risk pregnancy, antepartum Patient is doing well reporting lower back and hip pain.  Patient missed several appointments and is aware of that- no clear reason given 1 hour glucola today with third trimester labs Patient plans Nexplanon for contraception  2. history of c/s x 3 Will be scheduled  for repeat at 39 weeks  3. Multigravida of advanced maternal age in third trimester Missed growth ultrasound rescheduled  4. Maternal obesity affecting pregnancy, antepartum Patient taking ASA intermittently  Preterm labor symptoms and general obstetric precautions including but not limited to vaginal bleeding, contractions, leaking of fluid and fetal movement were reviewed in detail with the patient. Please refer to After Visit Summary for other counseling recommendations.   Return in about 2 weeks (around 11/30/2021) for in person, ROB, High risk.  Future Appointments  Date Time Provider Department Center  12/09/2021 12:30 PM [facility] NURSE [facility]  12/09/2021 12:45 PM [facility] US4 [facility]    Catalina Antigua, MD

## 2021-11-17 ENCOUNTER — Encounter: Payer: Self-pay | Admitting: Obstetrics and Gynecology

## 2021-11-17 DIAGNOSIS — O24419 Gestational diabetes mellitus in pregnancy, unspecified control: Secondary | ICD-10-CM | POA: Insufficient documentation

## 2021-11-17 HISTORY — DX: Gestational diabetes mellitus in pregnancy, unspecified control: O24.419

## 2021-11-17 LAB — CBC
Hematocrit: 35.2 % (ref 34.0–46.6)
Hemoglobin: 11 g/dL — ABNORMAL LOW (ref 11.1–15.9)
MCH: 24.7 pg — ABNORMAL LOW (ref 26.6–33.0)
MCHC: 31.3 g/dL — ABNORMAL LOW (ref 31.5–35.7)
MCV: 79 fL (ref 79–97)
NRBC: 1 % — ABNORMAL HIGH (ref 0–0)
Platelets: 327 10*3/uL (ref 150–450)
RBC: 4.46 x10E6/uL (ref 3.77–5.28)
RDW: 17.2 % — ABNORMAL HIGH (ref 11.7–15.4)
WBC: 8.9 10*3/uL (ref 3.4–10.8)

## 2021-11-17 LAB — RPR: RPR Ser Ql: NONREACTIVE

## 2021-11-17 LAB — HIV ANTIBODY (ROUTINE TESTING W REFLEX): HIV Screen 4th Generation wRfx: NONREACTIVE

## 2021-11-17 LAB — GLUCOSE TOLERANCE, 1 HOUR: Glucose, 1Hr PP: 236 mg/dL — ABNORMAL HIGH (ref 70–199)

## 2021-11-17 NOTE — Addendum Note (Signed)
Addended by: Catalina Antigua on: 11/17/2021 09:15 PM   Modules accepted: Orders

## 2021-11-18 ENCOUNTER — Telehealth: Payer: Self-pay

## 2021-11-18 MED ORDER — ACCU-CHEK GUIDE W/DEVICE KIT: 1.0000 | PACK | Freq: Four times a day (QID) | 0 refills | Status: DC

## 2021-11-18 MED ORDER — ACCU-CHEK SOFTCLIX LANCETS MISC: 1.0000 | Freq: Four times a day (QID) | 12 refills | Status: DC

## 2021-11-18 MED ORDER — ACCU-CHEK GUIDE VI STRP: ORAL_STRIP | 12 refills | Status: DC

## 2021-11-18 NOTE — Telephone Encounter (Signed)
Called to advise of glucose results, no answer, left vm. Supplies sent to pharmacy. 

## 2021-11-30 ENCOUNTER — Encounter: Payer: Medicaid Other | Admitting: Obstetrics and Gynecology

## 2021-12-05 ENCOUNTER — Encounter: Payer: Medicaid Other | Attending: Obstetrics and Gynecology | Admitting: Registered"

## 2021-12-05 ENCOUNTER — Ambulatory Visit (INDEPENDENT_AMBULATORY_CARE_PROVIDER_SITE_OTHER): Payer: Medicaid Other | Admitting: Registered"

## 2021-12-05 ENCOUNTER — Other Ambulatory Visit: Payer: Self-pay

## 2021-12-05 ENCOUNTER — Other Ambulatory Visit: Payer: Self-pay | Admitting: Obstetrics and Gynecology

## 2021-12-05 DIAGNOSIS — O24419 Gestational diabetes mellitus in pregnancy, unspecified control: Secondary | ICD-10-CM | POA: Diagnosis not present

## 2021-12-05 DIAGNOSIS — O09529 Supervision of elderly multigravida, unspecified trimester: Secondary | ICD-10-CM

## 2021-12-05 DIAGNOSIS — Z713 Dietary counseling and surveillance: Secondary | ICD-10-CM | POA: Insufficient documentation

## 2021-12-05 DIAGNOSIS — O0990 Supervision of high risk pregnancy, unspecified, unspecified trimester: Secondary | ICD-10-CM

## 2021-12-05 DIAGNOSIS — O24415 Gestational diabetes mellitus in pregnancy, controlled by oral hypoglycemic drugs: Secondary | ICD-10-CM

## 2021-12-05 MED ORDER — METFORMIN HCL 500 MG PO TABS: ORAL_TABLET | ORAL | 0 refills | Status: DC

## 2021-12-05 NOTE — Progress Notes (Signed)
Patient was seen for Gestational Diabetes self-management on 12/05/21  Start time 1033 and End time 1133   Estimated due date: 01/03/22; [redacted]w[redacted]d  Clinical: Medications: reviewed Medical History: no pertinent history Labs: OGTT 236, A1c 5.5%   Dietary and Lifestyle History: Pt states she has had a hard time eating due to tooth infection and waiting to get appt.  Pt started checking blood sugar this week. FBS 107-135 mg; random numbers during the day 85-222 mg/dL. Does not have any future Ob visits set up. Has some room for improvement in diet but MD put in Rx for metformin 1000 qhs (start with 500 for a couple of days).   Patient was instructed that she will be contacted by the [facility] office to schedule a visit this week.  Physical Activity: not assessed Stress: not assessed Sleep: not assessed  24 hr Recall:  First Meal: milk, sometimes bread Snack: fruit Second meal: noodles, eggs Snack: fruit Third meal: pizza Snack: Beverages: 2-3 c juice, water, milk  NUTRITION INTERVENTION  Nutrition education (E-1) on the following topics:   Initial Follow-up  [x]  []  Definition of Gestational Diabetes [x]  []  Why dietary management is important in controlling blood glucose [x]  []  Effects each nutrient has on blood glucose levels [x]  []  Simple carbohydrates vs complex carbohydrates [x]  []  Fluid intake [x]  []  Creating a balanced meal plan [x]  []  Carbohydrate counting  [x]  []  When to check blood glucose levels [x]  []  Proper blood glucose monitoring techniques [x]  []  Effect of stress and stress reduction techniques  []  []  Exercise effect on blood glucose levels, appropriate exercise during pregnancy []  []  Importance of limiting caffeine and abstaining from alcohol and smoking [x]  []  Medications used for blood sugar control during pregnancy [x]  []  Hypoglycemia and rule of 15 []  []  Postpartum self care  Patient already has a meter, is testing pre breakfast but not 2 hours after each  meal. FBS: 107-145 random: 85-220 mg/dL  Patient instructed to monitor glucose levels: FBS: 60 - ? 95 mg/dL (some clinics use 90 for cutoff) 1 hour: ? 140 mg/dL 2 hour: ? mg/dL  Patient received handouts: Nutrition Diabetes and Pregnancy Carbohydrate Counting List  Patient will be seen for follow-up as needed.

## 2021-12-06 LAB — COMPREHENSIVE METABOLIC PANEL
ALT: 15 IU/L (ref 0–32)
AST: 15 IU/L (ref 0–40)
Albumin/Globulin Ratio: 1.2 (ref 1.2–2.2)
Albumin: 3.4 g/dL — ABNORMAL LOW (ref 3.9–4.9)
Alkaline Phosphatase: 113 IU/L (ref 44–121)
BUN/Creatinine Ratio: 10 (ref 9–23)
BUN: 7 mg/dL (ref 6–20)
Bilirubin Total: 0.3 mg/dL (ref 0.0–1.2)
CO2: 19 mmol/L — ABNORMAL LOW (ref 20–29)
Calcium: 9.6 mg/dL (ref 8.7–10.2)
Chloride: 104 mmol/L (ref 96–106)
Creatinine, Ser: 0.7 mg/dL (ref 0.57–1.00)
Globulin, Total: 2.9 g/dL (ref 1.5–4.5)
Glucose: 112 mg/dL — ABNORMAL HIGH (ref 70–99)
Potassium: 4.7 mmol/L (ref 3.5–5.2)
Sodium: 141 mmol/L (ref 134–144)
Total Protein: 6.3 g/dL (ref 6.0–8.5)
eGFR: 113 mL/min/{1.73_m2} (ref >59)

## 2021-12-08 ENCOUNTER — Encounter: Payer: Self-pay | Admitting: Obstetrics and Gynecology

## 2021-12-08 ENCOUNTER — Ambulatory Visit (INDEPENDENT_AMBULATORY_CARE_PROVIDER_SITE_OTHER): Payer: Medicaid Other | Admitting: Obstetrics and Gynecology

## 2021-12-08 ENCOUNTER — Other Ambulatory Visit (HOSPITAL_COMMUNITY)
Admission: RE | Admit: 2021-12-08 | Discharge: 2021-12-08 | Disposition: A | Discharge status: Home / Self Care | Payer: Medicaid Other | Source: Ambulatory Visit | Attending: Obstetrics and Gynecology | Admitting: Obstetrics and Gynecology

## 2021-12-08 VITALS — BP 116/80 | HR 94 | Wt 238.0 lb

## 2021-12-08 DIAGNOSIS — Z3A36 36 weeks gestation of pregnancy: Secondary | ICD-10-CM

## 2021-12-08 DIAGNOSIS — O0993 Supervision of high risk pregnancy, unspecified, third trimester: Secondary | ICD-10-CM

## 2021-12-08 DIAGNOSIS — O0990 Supervision of high risk pregnancy, unspecified, unspecified trimester: Secondary | ICD-10-CM | POA: Diagnosis not present

## 2021-12-08 DIAGNOSIS — O34219 Maternal care for unspecified type scar from previous cesarean delivery: Secondary | ICD-10-CM

## 2021-12-08 DIAGNOSIS — O99213 Obesity complicating pregnancy, third trimester: Secondary | ICD-10-CM

## 2021-12-08 DIAGNOSIS — O24415 Gestational diabetes mellitus in pregnancy, controlled by oral hypoglycemic drugs: Secondary | ICD-10-CM

## 2021-12-08 DIAGNOSIS — O09523 Supervision of elderly multigravida, third trimester: Secondary | ICD-10-CM

## 2021-12-08 DIAGNOSIS — O99210 Obesity complicating pregnancy, unspecified trimester: Secondary | ICD-10-CM

## 2021-12-08 NOTE — Progress Notes (Signed)
   PRENATAL VISIT NOTE  Subjective:  Angela [name] is a 36 y.o. [id number] at [redacted]w[redacted]d being seen today for ongoing prenatal care.  She is currently monitored for the following issues for this high-risk pregnancy and has Sickle cell trait (HCC); Supervision of high risk pregnancy, antepartum; AMA (advanced maternal age) multigravida 35+; history of c/s x 3; Maternal obesity affecting pregnancy, antepartum; Alpha thalassemia silent carrier; Hemorrhoids during pregnancy in second trimester; and Gestational diabetes mellitus (GDM) in third trimester on their problem list.  Patient reports no complaints.  Contractions: Irritability. Vag. Bleeding: None.  Movement: Present. Denies leaking of fluid.   The following portions of the patient's history were reviewed and updated as appropriate: allergies, current medications, past family history, past medical history, past social history, past surgical history and problem list.   Objective:   Vitals:   12/08/21 0844  BP: 116/80  Pulse: 94  Weight: 238 lb (108 kg)    Fetal Status: Fetal Heart Rate (bpm): 160 Fundal Height: 37 cm Movement: Present     General:  Alert, oriented and cooperative. Patient is in no acute distress.  Skin: Skin is warm and dry. No rash noted.   Cardiovascular: Normal heart rate noted  Respiratory: Normal respiratory effort, no problems with respiration noted  Abdomen: Soft, gravid, appropriate for gestational age.  Pain/Pressure: Present     Pelvic: Cervical exam performed in the presence of a chaperone Dilation: Closed Effacement (%): Thick    Extremities: Normal range of motion.  Edema: None  Mental Status: Normal mood and affect. Normal behavior. Normal judgment and thought content.   Assessment and Plan:  Pregnancy: [id number] at [redacted]w[redacted]d 1. Supervision of high risk pregnancy, antepartum Patient is doing well  Cultures today  2. Gestational diabetes mellitus (GDM) in third trimester controlled on oral hypoglycemic  drug CBGs reviewed and fasting remain elevated as high as 115. Fasting mostly normal with a few elevated values and patient admits to dietary indiscretions Patient started metformin 500 mg on 7/18 pm Advised patient to take 1000 mg BID  3. history of c/s x 3 Scheduled for repeat on 8/9  4. Multigravida of advanced maternal age in third trimester Continue ASA  5. Maternal obesity affecting pregnancy, antepartum   Preterm labor symptoms and general obstetric precautions including but not limited to vaginal bleeding, contractions, leaking of fluid and fetal movement were reviewed in detail with the patient. Please refer to After Visit Summary for other counseling recommendations.   Return in about 1 week (around 12/15/2021) for in person, ROB, High risk.  Future Appointments  Date Time Provider Department Center  12/09/2021 12:30 PM [facility] NURSE [facility]  12/09/2021 12:45 PM [facility] US4 [facility]  12/15/2021  9:35 AM Alysia Penna, Marolyn Hammock, MD [facility] None    Catalina Antigua, MD

## 2021-12-09 ENCOUNTER — Ambulatory Visit: Payer: Medicaid Other

## 2021-12-09 ENCOUNTER — Other Ambulatory Visit: Payer: Self-pay | Admitting: Obstetrics

## 2021-12-09 ENCOUNTER — Other Ambulatory Visit: Payer: Self-pay | Admitting: *Deleted

## 2021-12-09 ENCOUNTER — Ambulatory Visit: Payer: Medicaid Other | Attending: Obstetrics

## 2021-12-09 ENCOUNTER — Ambulatory Visit: Payer: Medicaid Other | Admitting: *Deleted

## 2021-12-09 VITALS — BP 125/74 | HR 84

## 2021-12-09 DIAGNOSIS — O285 Abnormal chromosomal and genetic finding on antenatal screening of mother: Secondary | ICD-10-CM

## 2021-12-09 DIAGNOSIS — O99213 Obesity complicating pregnancy, third trimester: Secondary | ICD-10-CM | POA: Diagnosis present

## 2021-12-09 DIAGNOSIS — O24415 Gestational diabetes mellitus in pregnancy, controlled by oral hypoglycemic drugs: Secondary | ICD-10-CM | POA: Diagnosis not present

## 2021-12-09 DIAGNOSIS — E669 Obesity, unspecified: Secondary | ICD-10-CM

## 2021-12-09 DIAGNOSIS — Z6838 Body mass index (BMI) 38.0-38.9, adult: Secondary | ICD-10-CM

## 2021-12-09 DIAGNOSIS — O34219 Maternal care for unspecified type scar from previous cesarean delivery: Secondary | ICD-10-CM

## 2021-12-09 DIAGNOSIS — Z3A36 36 weeks gestation of pregnancy: Secondary | ICD-10-CM | POA: Diagnosis not present

## 2021-12-09 DIAGNOSIS — D563 Thalassemia minor: Secondary | ICD-10-CM

## 2021-12-09 DIAGNOSIS — O09523 Supervision of elderly multigravida, third trimester: Secondary | ICD-10-CM | POA: Diagnosis not present

## 2021-12-09 DIAGNOSIS — Z148 Genetic carrier of other disease: Secondary | ICD-10-CM | POA: Diagnosis not present

## 2021-12-09 DIAGNOSIS — O99210 Obesity complicating pregnancy, unspecified trimester: Secondary | ICD-10-CM

## 2021-12-09 DIAGNOSIS — D573 Sickle-cell trait: Secondary | ICD-10-CM

## 2021-12-09 DIAGNOSIS — O0990 Supervision of high risk pregnancy, unspecified, unspecified trimester: Secondary | ICD-10-CM

## 2021-12-09 LAB — CERVICOVAGINAL ANCILLARY ONLY
Chlamydia: NEGATIVE
Comment: NEGATIVE
Comment: NORMAL
Neisseria Gonorrhea: NEGATIVE

## 2021-12-12 LAB — CULTURE, BETA STREP (GROUP B ONLY): Strep Gp B Culture: NEGATIVE

## 2021-12-14 NOTE — Patient Instructions (Signed)
Angela [name]  12/14/2021   Your procedure is scheduled on:  12/28/2021  Arrive at 0730 at Entrance C on [address] at [facility]  and [employer]. You are invited to use the FREE valet parking or use the Visitor's parking deck.  Pick up the phone at the desk and dial [phone].  Call this number if you have problems the morning of surgery: [phone]  Remember:   Do not eat food:(After Midnight) Desps de medianoche.  Do not drink clear liquids: (After Midnight) Desps de medianoche.  Take these medicines the morning of surgery with A SIP OF WATER:  none   Do not wear jewelry, make-up or nail polish.  Do not wear lotions, powders, or perfumes. Do not wear deodorant.  Do not shave 48 hours prior to surgery.  Do not bring valuables to the hospital.  [facility] is not   responsible for any belongings or valuables brought to the hospital.  Contacts, dentures or bridgework may not be worn into surgery.  Leave suitcase in the car. After surgery it may be brought to your room.  For patients admitted to the hospital, checkout time is 11:00 AM the day of              discharge.      Please read over the following fact sheets that you were given:     Preparing for Surgery

## 2021-12-15 ENCOUNTER — Ambulatory Visit (INDEPENDENT_AMBULATORY_CARE_PROVIDER_SITE_OTHER): Payer: Medicaid Other | Admitting: Obstetrics and Gynecology

## 2021-12-15 ENCOUNTER — Encounter: Payer: Self-pay | Admitting: Obstetrics and Gynecology

## 2021-12-15 ENCOUNTER — Telehealth (HOSPITAL_COMMUNITY): Payer: Self-pay | Admitting: *Deleted

## 2021-12-15 VITALS — BP 130/84 | HR 94 | Wt 236.0 lb

## 2021-12-15 DIAGNOSIS — D563 Thalassemia minor: Secondary | ICD-10-CM

## 2021-12-15 DIAGNOSIS — O0990 Supervision of high risk pregnancy, unspecified, unspecified trimester: Secondary | ICD-10-CM

## 2021-12-15 DIAGNOSIS — O99210 Obesity complicating pregnancy, unspecified trimester: Secondary | ICD-10-CM

## 2021-12-15 DIAGNOSIS — O34219 Maternal care for unspecified type scar from previous cesarean delivery: Secondary | ICD-10-CM

## 2021-12-15 DIAGNOSIS — D573 Sickle-cell trait: Secondary | ICD-10-CM

## 2021-12-15 DIAGNOSIS — O24415 Gestational diabetes mellitus in pregnancy, controlled by oral hypoglycemic drugs: Secondary | ICD-10-CM

## 2021-12-15 DIAGNOSIS — O09523 Supervision of elderly multigravida, third trimester: Secondary | ICD-10-CM

## 2021-12-15 NOTE — Progress Notes (Signed)
Subjective:  Angela [name] is a 36 y.o. [id number] at [redacted]w[redacted]d being seen today for ongoing prenatal care.  She is currently monitored for the following issues for this high-risk pregnancy and has Sickle cell trait (HCC); Supervision of high risk pregnancy, antepartum; AMA (advanced maternal age) multigravida 35+; history of c/s x 3; Maternal obesity affecting pregnancy, antepartum; Alpha thalassemia silent carrier; Hemorrhoids during pregnancy in second trimester; and Gestational diabetes mellitus (GDM) in third trimester on their problem list.  Patient reports general discomforts of pregnancy.  Contractions: Not present. Vag. Bleeding: None.  Movement: Present. Denies leaking of fluid.   The following portions of the patient's history were reviewed and updated as appropriate: allergies, current medications, past family history, past medical history, past social history, past surgical history and problem list. Problem list updated.  Objective:   Vitals:   12/15/21 0932  BP: 130/84  Pulse: 94  Weight: 236 lb (107 kg)    Fetal Status: Fetal Heart Rate (bpm): 142   Movement: Present     General:  Alert, oriented and cooperative. Patient is in no acute distress.  Skin: Skin is warm and dry. No rash noted.   Cardiovascular: Normal heart rate noted  Respiratory: Normal respiratory effort, no problems with respiration noted  Abdomen: Soft, gravid, appropriate for gestational age. Pain/Pressure: Present     Pelvic:  Cervical exam deferred        Extremities: Normal range of motion.  Edema: Trace  Mental Status: Normal mood and affect. Normal behavior. Normal judgment and thought content.   Urinalysis:      Assessment and Plan:  Pregnancy: [id number] at [redacted]w[redacted]d  1. Supervision of high risk pregnancy, antepartum Stable  2. Gestational diabetes mellitus (GDM) in third trimester controlled on oral hypoglycemic drug CBG's in goal range Continue with Metformin Growth 59 % on 12/09/21 Continue with  weekly antenatal testing  3. history of c/s x 3 For repeat 12/28/21  4. Multigravida of advanced maternal age in third trimester Stable  5. Alpha thalassemia silent carrier Stable  6. Maternal obesity affecting pregnancy, antepartum Stable  7. Sickle cell trait (HCC) Stable  Term labor symptoms and general obstetric precautions including but not limited to vaginal bleeding, contractions, leaking of fluid and fetal movement were reviewed in detail with the patient. Please refer to After Visit Summary for other counseling recommendations.  Return in about 1 week (around 12/22/2021) for OB visit, face to face, MD only.   Hermina Staggers, MD

## 2021-12-15 NOTE — Patient Instructions (Signed)
Cesarean Delivery, Care After The following information offers guidance on how to care for yourself after your procedure. Your health care provider may also give you more specific instructions. If you have problems or questions, contact your health care provider. What can I expect after the procedure? After the procedure, it is common to have: A small amount of blood or clear fluid coming from the incision. Some redness, swelling, and pain in your incision area. Some abdominal pain and soreness. Vaginal bleeding (lochia). Even though you did not have a vaginal delivery, you will still have vaginal bleeding and discharge. Pelvic cramps. Fatigue. You may have pain, swelling, and discomfort in the tissue between your vagina and your anus (perineum) if: Your C-section was unplanned, and you were allowed to labor and push. An incision was made in the area (episiotomy) or the tissue tore during attempted vaginal delivery. Follow these instructions at home: Medicines Take over-the-counter and prescription medicines only as told by your health care provider. If you were prescribed an antibiotic medicine, take it as told by your health care provider. Do not stop taking the antibiotic even if you start to feel better. Ask your health care provider if the medicine prescribed to you requires you to avoid driving or using machinery. Incision care  Follow instructions from your health care provider about how to take care of your incision. Make sure you: Wash your hands with soap and water for an least 20 seconds before and after you change your bandage (dressing). If soap and water are not available, use hand sanitizer. If you have a dressing, change it or remove it as told by your health care provider. Leave stitches (sutures), skin staples, skin glue, or adhesive strips in place. These skin closures may need to stay in place for 2 weeks or longer. If adhesive strip edges start to loosen and curl up, you  may trim the loose edges. Do not remove adhesive strips completely unless your health care provider tells you to do that. Check your incision area every day for signs of infection. Check for: More redness, swelling, or pain. More fluid or blood. Warmth. Pus or a bad smell. Do not take baths, swim, or use a hot tub until your health care provider approves. Ask your health care provider if you may take showers. When you cough or sneeze, hug a pillow. This helps with pain and decreases the chance of your incision opening up (dehiscing). Do this until your incision heals. Managing constipation Your procedure may cause constipation. To prevent or treat constipation, you may need to: Drink enough fluid to keep your urine pale yellow. Take over-the-counter or prescription medicines. Eat foods that are high in fiber, such as beans, whole grains, and fresh fruits and vegetables. Limit foods that are high in fat and processed sugars, such as fried or sweet foods. Activity  If possible, have someone help you care for your baby and help with household activities for at least a few days after you leave the hospital. Rest as much as possible. Try to rest or take a nap while your baby is sleeping. You may have to avoid lifting. Ask your health care provider how much you can safely lift. Return to your normal activities as told by your health care provider. Ask your health care provider what activities are safe for you. Talk with your health care provider about when you can engage in sexual activity. This may depend on your: Risk of infection. How fast you heal. Comfort   and desire to engage in sexual activity. Lifestyle Do not drink alcohol. This is especially important if you are breastfeeding or taking pain medicine. Do not use any products that contain nicotine or tobacco. These products include cigarettes, chewing tobacco, and vaping devices, such as e-cigarettes. If you need help quitting, ask your  health care provider. General instructions Do not use tampons or douches until your health care provider approves. Wear loose, comfortable clothing and a supportive and well-fitting bra. If you pass a blood clot, save it and call your health care provider to discuss. Do not flush blood clots down the toilet before you get instructions from your health care provider. Keep all follow-up visits for you and your baby. This is important. Contact a health care provider if: You have: A fever. Dizziness or light-headedness. Bad-smelling vaginal discharge. A blood clot pass from your vagina. Pus, blood, or a bad smell coming from your incision. An incision that feels warm to the touch. More redness, swelling, or pain around your incision. Difficulty or pain when urinating. Nausea or vomiting. Little or no interest in activities you used to enjoy. Your breasts turn red or become painful or hard. You feel unusually sad or worried. You have questions about caring for yourself or your baby. You have redness, swelling, and pain in an arm or leg. Get help right away if: You have: Pain that does not go away or get better with medicine. Chest pain. Trouble breathing. Blurred vision, spots, or flashing lights in your vision. Thoughts about hurting yourself or your baby. New pain in your abdomen or in one of your legs. A severe headache that does not get better with pain medicine. You faint. You bleed from your vagina so much that you fill more than one sanitary pad in one hour. Bleeding should not be heavier than your heaviest period. These symptoms may be an emergency. Get help right away. Call 911. Do not wait to see if the symptoms will go away. Do not drive yourself to the hospital. Get help right away if you feel like you may hurt yourself or others, or have thoughts about taking your own life. Go to your nearest emergency room or: Call 911. Call the National Suicide Prevention Lifeline at  1-800-273-8255 or 988. This is open 24 hours a day. Text the Crisis Text Line at 741741. Summary After the procedure, it is common to have pain at your incision site, abdominal cramping, and slight bleeding from your vagina. Check your incision area every day for signs of infection. Tell your health care provider about any unusual symptoms. Keep all follow-up visits for you and your baby. This is important. This information is not intended to replace advice given to you by your health care provider. Make sure you discuss any questions you have with your health care provider. Document Revised: 12/08/2020 Document Reviewed: 12/08/2020 Elsevier Patient Education  2023 [employer]  

## 2021-12-15 NOTE — Telephone Encounter (Signed)
Preadmission screen  

## 2021-12-16 ENCOUNTER — Ambulatory Visit: Payer: Medicaid Other | Attending: Obstetrics and Gynecology

## 2021-12-16 ENCOUNTER — Ambulatory Visit: Payer: Medicaid Other | Admitting: *Deleted

## 2021-12-16 ENCOUNTER — Encounter: Payer: Self-pay | Admitting: *Deleted

## 2021-12-16 ENCOUNTER — Telehealth (HOSPITAL_COMMUNITY): Payer: Self-pay | Admitting: *Deleted

## 2021-12-16 VITALS — BP 133/78 | HR 95

## 2021-12-16 DIAGNOSIS — O09523 Supervision of elderly multigravida, third trimester: Secondary | ICD-10-CM | POA: Diagnosis not present

## 2021-12-16 DIAGNOSIS — O99213 Obesity complicating pregnancy, third trimester: Secondary | ICD-10-CM

## 2021-12-16 DIAGNOSIS — D563 Thalassemia minor: Secondary | ICD-10-CM

## 2021-12-16 DIAGNOSIS — O0990 Supervision of high risk pregnancy, unspecified, unspecified trimester: Secondary | ICD-10-CM

## 2021-12-16 DIAGNOSIS — O24415 Gestational diabetes mellitus in pregnancy, controlled by oral hypoglycemic drugs: Secondary | ICD-10-CM

## 2021-12-16 DIAGNOSIS — O285 Abnormal chromosomal and genetic finding on antenatal screening of mother: Secondary | ICD-10-CM

## 2021-12-16 DIAGNOSIS — O34219 Maternal care for unspecified type scar from previous cesarean delivery: Secondary | ICD-10-CM

## 2021-12-16 DIAGNOSIS — Z3A37 37 weeks gestation of pregnancy: Secondary | ICD-10-CM

## 2021-12-16 DIAGNOSIS — O99210 Obesity complicating pregnancy, unspecified trimester: Secondary | ICD-10-CM | POA: Insufficient documentation

## 2021-12-16 DIAGNOSIS — E669 Obesity, unspecified: Secondary | ICD-10-CM

## 2021-12-16 DIAGNOSIS — D573 Sickle-cell trait: Secondary | ICD-10-CM

## 2021-12-16 NOTE — Telephone Encounter (Signed)
Preadmission screen  

## 2021-12-19 ENCOUNTER — Telehealth (HOSPITAL_COMMUNITY): Payer: Self-pay | Admitting: *Deleted

## 2021-12-19 NOTE — Telephone Encounter (Signed)
Preadmission screen  

## 2021-12-20 ENCOUNTER — Telehealth (HOSPITAL_COMMUNITY): Payer: Self-pay | Admitting: *Deleted

## 2021-12-20 NOTE — Telephone Encounter (Signed)
Preadmission screen  

## 2021-12-21 ENCOUNTER — Telehealth (HOSPITAL_COMMUNITY): Payer: Self-pay | Admitting: *Deleted

## 2021-12-21 NOTE — Telephone Encounter (Signed)
Preadmission screen  

## 2021-12-22 ENCOUNTER — Encounter: Payer: Self-pay | Admitting: Obstetrics & Gynecology

## 2021-12-22 ENCOUNTER — Telehealth (HOSPITAL_COMMUNITY): Payer: Self-pay | Admitting: *Deleted

## 2021-12-22 ENCOUNTER — Ambulatory Visit (INDEPENDENT_AMBULATORY_CARE_PROVIDER_SITE_OTHER): Payer: Medicaid Other | Admitting: Obstetrics & Gynecology

## 2021-12-22 VITALS — BP 131/80 | HR 90 | Wt 238.0 lb

## 2021-12-22 DIAGNOSIS — O34219 Maternal care for unspecified type scar from previous cesarean delivery: Secondary | ICD-10-CM

## 2021-12-22 DIAGNOSIS — O0990 Supervision of high risk pregnancy, unspecified, unspecified trimester: Secondary | ICD-10-CM | POA: Diagnosis not present

## 2021-12-22 DIAGNOSIS — O24415 Gestational diabetes mellitus in pregnancy, controlled by oral hypoglycemic drugs: Secondary | ICD-10-CM

## 2021-12-22 DIAGNOSIS — O99210 Obesity complicating pregnancy, unspecified trimester: Secondary | ICD-10-CM

## 2021-12-22 DIAGNOSIS — O09523 Supervision of elderly multigravida, third trimester: Secondary | ICD-10-CM

## 2021-12-22 NOTE — Telephone Encounter (Signed)
Preadmission screen  

## 2021-12-22 NOTE — Progress Notes (Signed)
Pt states she had ?LOF this morning.

## 2021-12-22 NOTE — Progress Notes (Signed)
   PRENATAL VISIT NOTE  Subjective:  Angela [name] is a 36 y.o. [id number] at [redacted]w[redacted]d being seen today for ongoing prenatal care.  She is currently monitored for the following issues for this high-risk pregnancy and has Sickle cell trait (HCC); Supervision of high risk pregnancy, antepartum; AMA (advanced maternal age) multigravida 35+; history of c/s x 3; Maternal obesity affecting pregnancy, antepartum; Alpha thalassemia silent carrier; Hemorrhoids during pregnancy in second trimester; and Gestational diabetes mellitus (GDM) in third trimester on their problem list.  Patient reports  left hip pain and vaginal discharge today .  Contractions: Irregular. Vag. Bleeding: None.  Movement: Present. Denies leaking of fluid.   The following portions of the patient's history were reviewed and updated as appropriate: allergies, current medications, past family history, past medical history, past social history, past surgical history and problem list.   Objective:   Vitals:   12/22/21 0852  BP: 131/80  Pulse: 90  Weight: 238 lb (108 kg)    Fetal Status: Fetal Heart Rate (bpm): 150   Movement: Present     General:  Alert, oriented and cooperative. Patient is in no acute distress.  Skin: Skin is warm and dry. No rash noted.   Cardiovascular: Normal heart rate noted  Respiratory: Normal respiratory effort, no problems with respiration noted  Abdomen: Soft, gravid, appropriate for gestational age.  Pain/Pressure: Present     Pelvic: Cervical exam deferred        Extremities: Normal range of motion.     Mental Status: Normal mood and affect. Normal behavior. Normal judgment and thought content.   Assessment and Plan:  Pregnancy: [id number] at [redacted]w[redacted]d 1. Gestational diabetes mellitus (GDM) in third trimester controlled on oral hypoglycemic drug Controlled on oral agents  2. Maternal obesity affecting pregnancy, antepartum Body mass index is 42.16 kg/m.   3. history of c/s x 3 RCS 8/7  4.  Supervision of high risk pregnancy, antepartum   5. Multigravida of advanced maternal age in third trimester Plans Nexplanon  Term labor symptoms and general obstetric precautions including but not limited to vaginal bleeding, contractions, leaking of fluid and fetal movement were reviewed in detail with the patient. Please refer to After Visit Summary for other counseling recommendations.   Return if symptoms worsen or fail to improve, for postop.  Future Appointments  Date Time Provider Department Center  12/26/2021 10:00 AM [facility] PAT 1 [facility] None    Scheryl Darter, MD

## 2021-12-23 ENCOUNTER — Encounter (HOSPITAL_COMMUNITY): Payer: Self-pay

## 2021-12-26 ENCOUNTER — Encounter (HOSPITAL_COMMUNITY): Payer: Self-pay | Admitting: Obstetrics & Gynecology

## 2021-12-26 ENCOUNTER — Inpatient Hospital Stay (HOSPITAL_COMMUNITY): Payer: Medicaid Other | Admitting: Anesthesiology

## 2021-12-26 ENCOUNTER — Other Ambulatory Visit: Payer: Self-pay

## 2021-12-26 ENCOUNTER — Encounter (HOSPITAL_COMMUNITY)
Admission: RE | Admit: 2021-12-26 | Discharge: 2021-12-26 | Disposition: A | Discharge status: Home / Self Care | Payer: Medicaid Other | Source: Ambulatory Visit | Attending: Family Medicine | Admitting: Family Medicine

## 2021-12-26 ENCOUNTER — Inpatient Hospital Stay (HOSPITAL_COMMUNITY)
Admission: AD | Admit: 2021-12-26 | Discharge: 2021-12-28 | DRG: 787 | Disposition: A | Discharge status: Home / Self Care | Payer: Medicaid Other | Attending: Family Medicine | Admitting: Family Medicine

## 2021-12-26 ENCOUNTER — Encounter (HOSPITAL_COMMUNITY)
Admission: AD | Disposition: A | Discharge status: Home / Self Care | Payer: Self-pay | Source: Home / Self Care | Attending: Family Medicine

## 2021-12-26 DIAGNOSIS — O872 Hemorrhoids in the puerperium: Secondary | ICD-10-CM | POA: Diagnosis not present

## 2021-12-26 DIAGNOSIS — D573 Sickle-cell trait: Secondary | ICD-10-CM | POA: Diagnosis present

## 2021-12-26 DIAGNOSIS — D563 Thalassemia minor: Secondary | ICD-10-CM | POA: Diagnosis present

## 2021-12-26 DIAGNOSIS — O99214 Obesity complicating childbirth: Secondary | ICD-10-CM

## 2021-12-26 DIAGNOSIS — Z98891 History of uterine scar from previous surgery: Principal | ICD-10-CM

## 2021-12-26 DIAGNOSIS — Z3A38 38 weeks gestation of pregnancy: Secondary | ICD-10-CM

## 2021-12-26 DIAGNOSIS — Z30430 Encounter for insertion of intrauterine contraceptive device: Secondary | ICD-10-CM | POA: Diagnosis not present

## 2021-12-26 DIAGNOSIS — O24425 Gestational diabetes mellitus in childbirth, controlled by oral hypoglycemic drugs: Principal | ICD-10-CM | POA: Diagnosis present

## 2021-12-26 DIAGNOSIS — O34219 Maternal care for unspecified type scar from previous cesarean delivery: Secondary | ICD-10-CM

## 2021-12-26 DIAGNOSIS — O9902 Anemia complicating childbirth: Secondary | ICD-10-CM | POA: Diagnosis present

## 2021-12-26 DIAGNOSIS — O34211 Maternal care for low transverse scar from previous cesarean delivery: Secondary | ICD-10-CM

## 2021-12-26 DIAGNOSIS — O24419 Gestational diabetes mellitus in pregnancy, unspecified control: Secondary | ICD-10-CM | POA: Diagnosis present

## 2021-12-26 DIAGNOSIS — Z30017 Encounter for initial prescription of implantable subdermal contraceptive: Secondary | ICD-10-CM

## 2021-12-26 DIAGNOSIS — O09529 Supervision of elderly multigravida, unspecified trimester: Secondary | ICD-10-CM

## 2021-12-26 DIAGNOSIS — O0990 Supervision of high risk pregnancy, unspecified, unspecified trimester: Secondary | ICD-10-CM

## 2021-12-26 HISTORY — DX: Gestational diabetes mellitus in pregnancy, unspecified control: O24.419

## 2021-12-26 HISTORY — DX: Thalassemia minor: D56.3

## 2021-12-26 LAB — CBC
HCT: 35.2 % — ABNORMAL LOW (ref 36.0–46.0)
Hemoglobin: 11.5 g/dL — ABNORMAL LOW (ref 12.0–15.0)
MCH: 23.9 pg — ABNORMAL LOW (ref 26.0–34.0)
MCHC: 32.7 g/dL (ref 30.0–36.0)
MCV: 73.2 fL — ABNORMAL LOW (ref 80.0–100.0)
Platelets: 285 10*3/uL (ref 150–400)
RBC: 4.81 MIL/uL (ref 3.87–5.11)
RDW: 19.8 % — ABNORMAL HIGH (ref 11.5–15.5)
WBC: 8.4 10*3/uL (ref 4.0–10.5)
nRBC: 1.9 % — ABNORMAL HIGH (ref 0.0–0.2)

## 2021-12-26 LAB — PREPARE RBC (CROSSMATCH)

## 2021-12-26 LAB — RPR: RPR Ser Ql: NONREACTIVE

## 2021-12-26 LAB — GLUCOSE, CAPILLARY: Glucose-Capillary: 129 mg/dL — ABNORMAL HIGH (ref 70–99)

## 2021-12-26 SURGERY — Surgical Case
Anesthesia: Spinal

## 2021-12-26 MED ORDER — OXYCODONE HCL 5 MG PO TABS
5.0000 mg | ORAL_TABLET | Freq: Four times a day (QID) | ORAL | Status: DC | PRN
  Filled 2021-12-26: qty 1

## 2021-12-26 MED ORDER — DIPHENHYDRAMINE HCL 25 MG PO CAPS: 25.0000 mg | ORAL_CAPSULE | ORAL | Status: DC | PRN

## 2021-12-26 MED ORDER — DIPHENHYDRAMINE HCL 50 MG/ML IJ SOLN: 12.5000 mg | INTRAMUSCULAR | Status: DC | PRN

## 2021-12-26 MED ORDER — MEPERIDINE HCL 25 MG/ML IJ SOLN: 6.2500 mg | INTRAMUSCULAR | Status: DC | PRN

## 2021-12-26 MED ORDER — OXYTOCIN-SODIUM CHLORIDE 30-0.9 UT/500ML-% IV SOLN
INTRAVENOUS | Status: AC
  Filled 2021-12-26: qty 500

## 2021-12-26 MED ORDER — OXYCODONE HCL 5 MG PO TABS
5.0000 mg | ORAL_TABLET | ORAL | Status: DC | PRN
  Administered 2021-12-27: 5 mg via ORAL

## 2021-12-26 MED ORDER — SODIUM CHLORIDE 0.9 % IV SOLN
INTRAVENOUS | Status: AC
  Filled 2021-12-26: qty 5

## 2021-12-26 MED ORDER — OXYCODONE-ACETAMINOPHEN 5-325 MG PO TABS: 2.0000 | ORAL_TABLET | ORAL | Status: DC | PRN

## 2021-12-26 MED ORDER — BUPIVACAINE IN DEXTROSE 0.75-8.25 % IT SOLN
INTRATHECAL | Status: DC | PRN
  Administered 2021-12-26: 1.4 mL via INTRATHECAL

## 2021-12-26 MED ORDER — MAGNESIUM HYDROXIDE 400 MG/5ML PO SUSP: 30.0000 mL | ORAL | Status: DC | PRN

## 2021-12-26 MED ORDER — DIBUCAINE (PERIANAL) 1 % EX OINT: 1.0000 | TOPICAL_OINTMENT | CUTANEOUS | Status: DC | PRN

## 2021-12-26 MED ORDER — STERILE WATER FOR IRRIGATION IR SOLN
Status: DC | PRN
  Administered 2021-12-26: 1000 mL

## 2021-12-26 MED ORDER — IBUPROFEN 600 MG PO TABS
600.0000 mg | ORAL_TABLET | Freq: Four times a day (QID) | ORAL | Status: DC
  Administered 2021-12-27 – 2021-12-28 (×5): 600 mg via ORAL
  Filled 2021-12-26 (×6): qty 1

## 2021-12-26 MED ORDER — SODIUM CHLORIDE 0.9% FLUSH: 3.0000 mL | INTRAVENOUS | Status: DC | PRN

## 2021-12-26 MED ORDER — SODIUM CHLORIDE 0.9 % IV SOLN: INTRAVENOUS | Status: DC | PRN

## 2021-12-26 MED ORDER — SCOPOLAMINE 1 MG/3DAYS TD PT72: 1.0000 | MEDICATED_PATCH | Freq: Once | TRANSDERMAL | Status: DC

## 2021-12-26 MED ORDER — SENNOSIDES-DOCUSATE SODIUM 8.6-50 MG PO TABS
2.0000 | ORAL_TABLET | Freq: Every day | ORAL | Status: DC
  Administered 2021-12-27 – 2021-12-28 (×2): 2 via ORAL
  Filled 2021-12-26 (×2): qty 2

## 2021-12-26 MED ORDER — LACTATED RINGERS IV SOLN: INTRAVENOUS | Status: DC | PRN

## 2021-12-26 MED ORDER — ONDANSETRON HCL 4 MG/2ML IJ SOLN
INTRAMUSCULAR | Status: DC | PRN
  Administered 2021-12-26: 4 mg via INTRAVENOUS

## 2021-12-26 MED ORDER — ONDANSETRON HCL 4 MG/2ML IJ SOLN: 4.0000 mg | Freq: Three times a day (TID) | INTRAMUSCULAR | Status: DC | PRN

## 2021-12-26 MED ORDER — METOCLOPRAMIDE HCL 5 MG/ML IJ SOLN
INTRAMUSCULAR | Status: AC
  Filled 2021-12-26: qty 2

## 2021-12-26 MED ORDER — LACTATED RINGERS IV BOLUS
1000.0000 mL | Freq: Once | INTRAVENOUS | Status: AC
  Administered 2021-12-26: 1000 mL via INTRAVENOUS

## 2021-12-26 MED ORDER — LACTATED RINGERS IV SOLN
INTRAVENOUS | Status: DC
Start: 2021-12-26 — End: 2021-12-28

## 2021-12-26 MED ORDER — MORPHINE SULFATE (PF) 0.5 MG/ML IJ SOLN
INTRAMUSCULAR | Status: AC
  Filled 2021-12-26: qty 10

## 2021-12-26 MED ORDER — DEXAMETHASONE SODIUM PHOSPHATE 4 MG/ML IJ SOLN
INTRAMUSCULAR | Status: AC
  Filled 2021-12-26: qty 1

## 2021-12-26 MED ORDER — OXYTOCIN-SODIUM CHLORIDE 30-0.9 UT/500ML-% IV SOLN
INTRAVENOUS | Status: DC | PRN
  Administered 2021-12-26: 30 [IU] via INTRAVENOUS

## 2021-12-26 MED ORDER — ACETAMINOPHEN 500 MG PO TABS
1000.0000 mg | ORAL_TABLET | Freq: Four times a day (QID) | ORAL | Status: DC
  Administered 2021-12-26 – 2021-12-28 (×9): 1000 mg via ORAL
  Filled 2021-12-26 (×9): qty 2

## 2021-12-26 MED ORDER — PHENYLEPHRINE HCL-NACL 20-0.9 MG/250ML-% IV SOLN
INTRAVENOUS | Status: DC | PRN
  Administered 2021-12-26: 60 ug/min via INTRAVENOUS

## 2021-12-26 MED ORDER — PRENATAL MULTIVITAMIN CH
1.0000 | ORAL_TABLET | Freq: Every day | ORAL | Status: DC
Start: 2021-12-26 — End: 2021-12-28
  Administered 2021-12-27: 1 via ORAL
  Filled 2021-12-26: qty 1

## 2021-12-26 MED ORDER — DIPHENHYDRAMINE HCL 50 MG/ML IJ SOLN
INTRAMUSCULAR | Status: AC
  Filled 2021-12-26: qty 1

## 2021-12-26 MED ORDER — HYDROMORPHONE HCL 1 MG/ML IJ SOLN: 1.0000 mg | INTRAMUSCULAR | Status: DC | PRN

## 2021-12-26 MED ORDER — SCOPOLAMINE 1 MG/3DAYS TD PT72
MEDICATED_PATCH | TRANSDERMAL | Status: DC | PRN
  Administered 2021-12-26: 1 via TRANSDERMAL

## 2021-12-26 MED ORDER — TERBUTALINE SULFATE 1 MG/ML IJ SOLN
0.2500 mg | Freq: Once | INTRAMUSCULAR | Status: AC
  Administered 2021-12-26: 0.25 mg via SUBCUTANEOUS
  Filled 2021-12-26: qty 1

## 2021-12-26 MED ORDER — GABAPENTIN 300 MG PO CAPS
300.0000 mg | ORAL_CAPSULE | Freq: Two times a day (BID) | ORAL | Status: DC
  Administered 2021-12-26 – 2021-12-28 (×4): 300 mg via ORAL
  Filled 2021-12-26 (×4): qty 1

## 2021-12-26 MED ORDER — WITCH HAZEL-GLYCERIN EX PADS: 1.0000 | MEDICATED_PAD | CUTANEOUS | Status: DC | PRN

## 2021-12-26 MED ORDER — COCONUT OIL OIL: 1.0000 | TOPICAL_OIL | Status: DC | PRN

## 2021-12-26 MED ORDER — DIPHENHYDRAMINE HCL 50 MG/ML IJ SOLN
INTRAMUSCULAR | Status: DC | PRN
  Administered 2021-12-26: 25 mg via INTRAVENOUS

## 2021-12-26 MED ORDER — MENTHOL 3 MG MT LOZG: 1.0000 | LOZENGE | OROMUCOSAL | Status: DC | PRN

## 2021-12-26 MED ORDER — OXYTOCIN-SODIUM CHLORIDE 30-0.9 UT/500ML-% IV SOLN: 2.5000 [IU]/h | INTRAVENOUS | Status: AC

## 2021-12-26 MED ORDER — TRANEXAMIC ACID-NACL 1000-0.7 MG/100ML-% IV SOLN
1000.0000 mg | INTRAVENOUS | Status: AC
  Administered 2021-12-26: 1000 mg via INTRAVENOUS
  Filled 2021-12-26: qty 100

## 2021-12-26 MED ORDER — FERROUS SULFATE 325 (65 FE) MG PO TABS
325.0000 mg | ORAL_TABLET | ORAL | Status: DC
  Administered 2021-12-27: 325 mg via ORAL
  Filled 2021-12-26: qty 1

## 2021-12-26 MED ORDER — SCOPOLAMINE 1 MG/3DAYS TD PT72
MEDICATED_PATCH | TRANSDERMAL | Status: AC
  Filled 2021-12-26: qty 1

## 2021-12-26 MED ORDER — NALOXONE HCL 0.4 MG/ML IJ SOLN: 0.4000 mg | INTRAMUSCULAR | Status: DC | PRN

## 2021-12-26 MED ORDER — NALOXONE HCL 4 MG/10ML IJ SOLN: 1.0000 ug/kg/h | INTRAVENOUS | Status: DC | PRN

## 2021-12-26 MED ORDER — SODIUM CHLORIDE 0.9 % IR SOLN
Status: DC | PRN
  Administered 2021-12-26: 1

## 2021-12-26 MED ORDER — ACETAMINOPHEN 500 MG PO TABS: 1000.0000 mg | ORAL_TABLET | Freq: Four times a day (QID) | ORAL | Status: DC

## 2021-12-26 MED ORDER — ZOLPIDEM TARTRATE 5 MG PO TABS: 5.0000 mg | ORAL_TABLET | Freq: Every evening | ORAL | Status: DC | PRN

## 2021-12-26 MED ORDER — TETANUS-DIPHTH-ACELL PERTUSSIS 5-2.5-18.5 LF-MCG/0.5 IM SUSY: 0.5000 mL | PREFILLED_SYRINGE | Freq: Once | INTRAMUSCULAR | Status: DC

## 2021-12-26 MED ORDER — SODIUM CHLORIDE 0.9 % IV SOLN
500.0000 mg | INTRAVENOUS | Status: AC
  Administered 2021-12-26: 500 mg via INTRAVENOUS
  Filled 2021-12-26: qty 5

## 2021-12-26 MED ORDER — KETOROLAC TROMETHAMINE 30 MG/ML IJ SOLN
30.0000 mg | Freq: Four times a day (QID) | INTRAMUSCULAR | Status: AC
  Administered 2021-12-26 (×2): 30 mg via INTRAVENOUS
  Filled 2021-12-26 (×3): qty 1

## 2021-12-26 MED ORDER — DEXAMETHASONE SODIUM PHOSPHATE 4 MG/ML IJ SOLN
INTRAMUSCULAR | Status: DC | PRN
  Administered 2021-12-26: 4 mg via INTRAVENOUS

## 2021-12-26 MED ORDER — KETOROLAC TROMETHAMINE 30 MG/ML IJ SOLN
INTRAMUSCULAR | Status: AC
  Filled 2021-12-26: qty 1

## 2021-12-26 MED ORDER — CEFAZOLIN SODIUM-DEXTROSE 2-4 GM/100ML-% IV SOLN
2.0000 g | INTRAVENOUS | Status: AC
  Administered 2021-12-26: 2 g via INTRAVENOUS
  Filled 2021-12-26: qty 100

## 2021-12-26 MED ORDER — METOCLOPRAMIDE HCL 5 MG/ML IJ SOLN
INTRAMUSCULAR | Status: DC | PRN
  Administered 2021-12-26: 10 mg via INTRAVENOUS

## 2021-12-26 MED ORDER — DIPHENHYDRAMINE HCL 25 MG PO CAPS: 25.0000 mg | ORAL_CAPSULE | Freq: Four times a day (QID) | ORAL | Status: DC | PRN

## 2021-12-26 MED ORDER — LACTATED RINGERS IV SOLN: INTRAVENOUS | Status: DC

## 2021-12-26 MED ORDER — FENTANYL CITRATE (PF) 100 MCG/2ML IJ SOLN
INTRAMUSCULAR | Status: AC
  Filled 2021-12-26: qty 2

## 2021-12-26 MED ORDER — FENTANYL CITRATE (PF) 100 MCG/2ML IJ SOLN
INTRAMUSCULAR | Status: DC | PRN
  Administered 2021-12-26: 15 ug via INTRATHECAL

## 2021-12-26 MED ORDER — KETOROLAC TROMETHAMINE 30 MG/ML IJ SOLN: 30.0000 mg | Freq: Four times a day (QID) | INTRAMUSCULAR | Status: AC | PRN

## 2021-12-26 MED ORDER — ONDANSETRON HCL 4 MG/2ML IJ SOLN
INTRAMUSCULAR | Status: AC
  Filled 2021-12-26: qty 2

## 2021-12-26 MED ORDER — KETOROLAC TROMETHAMINE 30 MG/ML IJ SOLN
30.0000 mg | Freq: Four times a day (QID) | INTRAMUSCULAR | Status: AC | PRN
Start: 2021-12-26 — End: 2021-12-27
  Administered 2021-12-27: 30 mg via INTRAMUSCULAR
  Filled 2021-12-26: qty 1

## 2021-12-26 MED ORDER — HYDROMORPHONE HCL 1 MG/ML IJ SOLN
0.2500 mg | INTRAMUSCULAR | Status: DC | PRN
  Administered 2021-12-26: 0.5 mg via INTRAVENOUS

## 2021-12-26 MED ORDER — GABAPENTIN 300 MG PO CAPS: 300.0000 mg | ORAL_CAPSULE | Freq: Two times a day (BID) | ORAL | Status: DC

## 2021-12-26 MED ORDER — SOD CITRATE-CITRIC ACID 500-334 MG/5ML PO SOLN
30.0000 mL | Freq: Once | ORAL | Status: AC
  Administered 2021-12-26: 30 mL via ORAL
  Filled 2021-12-26: qty 30

## 2021-12-26 MED ORDER — HYDROMORPHONE HCL 1 MG/ML IJ SOLN
INTRAMUSCULAR | Status: AC
  Filled 2021-12-26: qty 0.5

## 2021-12-26 MED ORDER — FAMOTIDINE IN NACL 20-0.9 MG/50ML-% IV SOLN
20.0000 mg | Freq: Once | INTRAVENOUS | Status: AC
  Administered 2021-12-26: 20 mg via INTRAVENOUS
  Filled 2021-12-26: qty 50

## 2021-12-26 MED ORDER — KETOROLAC TROMETHAMINE 30 MG/ML IJ SOLN
30.0000 mg | Freq: Once | INTRAMUSCULAR | Status: AC | PRN
  Administered 2021-12-26: 30 mg via INTRAVENOUS

## 2021-12-26 MED ORDER — SIMETHICONE 80 MG PO CHEW
80.0000 mg | CHEWABLE_TABLET | ORAL | Status: DC | PRN
  Administered 2021-12-28: 80 mg via ORAL
  Filled 2021-12-26: qty 1

## 2021-12-26 MED ORDER — MORPHINE SULFATE (PF) 0.5 MG/ML IJ SOLN
INTRAMUSCULAR | Status: DC | PRN
  Administered 2021-12-26: 150 ug via INTRATHECAL

## 2021-12-26 MED ORDER — ENOXAPARIN SODIUM 60 MG/0.6ML IJ SOSY
50.0000 mg | PREFILLED_SYRINGE | INTRAMUSCULAR | Status: DC
  Administered 2021-12-27 – 2021-12-28 (×2): 50 mg via SUBCUTANEOUS
  Filled 2021-12-26 (×2): qty 0.6

## 2021-12-26 MED ORDER — ONDANSETRON HCL 4 MG/2ML IJ SOLN: 4.0000 mg | Freq: Once | INTRAMUSCULAR | Status: DC | PRN

## 2021-12-26 MED ORDER — MEASLES, MUMPS & RUBELLA VAC IJ SOLR: 0.5000 mL | Freq: Once | INTRAMUSCULAR | Status: DC

## 2021-12-26 SURGICAL SUPPLY — 32 items
BENZOIN TINCTURE PRP APPL 2/3 (GAUZE/BANDAGES/DRESSINGS) ×2 IMPLANT
CANISTER WOUND CARE 500ML ATS (WOUND CARE) ×1 IMPLANT
CHLORAPREP W/TINT 26ML (MISCELLANEOUS) ×4 IMPLANT
CLAMP UMBILICAL CORD (MISCELLANEOUS) ×2 IMPLANT
CLOTH BEACON ORANGE TIMEOUT ST (SAFETY) ×2 IMPLANT
DRESSING PREVENA PLUS CUSTOM (GAUZE/BANDAGES/DRESSINGS) IMPLANT
DRSG OPSITE POSTOP 4X10 (GAUZE/BANDAGES/DRESSINGS) ×2 IMPLANT
DRSG OPSITE POSTOP 4X12 (GAUZE/BANDAGES/DRESSINGS) ×1 IMPLANT
DRSG PREVENA PLUS CUSTOM (GAUZE/BANDAGES/DRESSINGS) ×2
ELECT REM PT RETURN 9FT ADLT (ELECTROSURGICAL) ×2
ELECTRODE REM PT RTRN 9FT ADLT (ELECTROSURGICAL) ×1 IMPLANT
GAUZE SPONGE 4X4 12PLY STRL LF (GAUZE/BANDAGES/DRESSINGS) ×2 IMPLANT
GLOVE BIOGEL PI IND STRL 7.0 (GLOVE) ×3 IMPLANT
GLOVE BIOGEL PI INDICATOR 7.0 (GLOVE) ×3
GLOVE ECLIPSE 6.5 STRL STRAW (GLOVE) ×2 IMPLANT
GOWN STRL REUS W/ TWL LRG LVL3 (GOWN DISPOSABLE) ×2 IMPLANT
GOWN STRL REUS W/TWL LRG LVL3 (GOWN DISPOSABLE) ×2
NS IRRIG 1000ML POUR BTL (IV SOLUTION) ×2 IMPLANT
PAD ABD 7.5X8 STRL (GAUZE/BANDAGES/DRESSINGS) ×2 IMPLANT
PAD OB MATERNITY 4.3X12.25 (PERSONAL CARE ITEMS) ×2 IMPLANT
PAD PREP 24X48 CUFFED NSTRL (MISCELLANEOUS) ×2 IMPLANT
RETRACTOR WND ALEXIS 25 LRG (MISCELLANEOUS) IMPLANT
RTRCTR WOUND ALEXIS 25CM LRG (MISCELLANEOUS)
STRIP CLOSURE SKIN 1/2X4 (GAUZE/BANDAGES/DRESSINGS) ×2 IMPLANT
SUT PLAIN 2 0 XLH (SUTURE) ×2 IMPLANT
SUT VIC AB 0 CT1 36 (SUTURE) ×4 IMPLANT
SUT VIC AB 2-0 CT1 27 (SUTURE) ×1
SUT VIC AB 2-0 CT1 TAPERPNT 27 (SUTURE) ×1 IMPLANT
SUT VIC AB 4-0 KS 27 (SUTURE) ×2 IMPLANT
TOWEL OR 17X24 6PK STRL BLUE (TOWEL DISPOSABLE) ×6 IMPLANT
TRAY FOLEY CATH SILVER 16FR (SET/KITS/TRAYS/PACK) ×2 IMPLANT
WATER STERILE IRR 1000ML POUR (IV SOLUTION) ×2 IMPLANT

## 2021-12-26 NOTE — Anesthesia Postprocedure Evaluation (Signed)
Anesthesia Post Note  Patient: Angela [name]  Procedure(s) Performed: CESAREAN SECTION     Patient location during evaluation: PACU Anesthesia Type: Spinal Level of consciousness: oriented and awake and alert Pain management: pain level controlled Vital Signs Assessment: post-procedure vital signs reviewed and stable Respiratory status: spontaneous breathing, respiratory function stable and nonlabored ventilation Cardiovascular status: blood pressure returned to baseline and stable Postop Assessment: no headache, no backache, no apparent nausea or vomiting, spinal receding and patient able to bend at knees Anesthetic complications: no   No notable events documented.  Last Vitals:  Vitals:   12/26/21 0815 12/26/21 0835  BP: 119/68 113/75  Pulse: 92 79  Resp: (!) 27   Temp:  37.2 C  SpO2: 100% 99%    Last Pain:  Vitals:   12/26/21 0835  TempSrc: Axillary  PainSc:    Pain Goal: Patients Stated Pain Goal: 5 (12/26/21 0730)  LLE Motor Response: Non-purposeful movement (12/26/21 0815) LLE Sensation: Tingling (12/26/21 0815) RLE Motor Response: Non-purposeful movement (12/26/21 0815) RLE Sensation: Tingling (12/26/21 0815) L Sensory Level: L4-Anterior knee, lower leg (12/26/21 0815) R Sensory Level: L3-Anterior knee, lower leg (12/26/21 0815) Epidural/Spinal Function Cutaneous sensation: Able to Wiggle Toes (12/26/21 0815), Patient able to flex knees: Yes (12/26/21 0815), Patient able to lift hips off bed: Yes (12/26/21 0815), Back pain beyond tenderness at insertion site: No (12/26/21 0800), Progressively worsening motor and/or sensory loss: No (12/26/21 0800), Bowel and/or bladder incontinence post epidural: No (12/26/21 0800)  [name] A.

## 2021-12-26 NOTE — Discharge Summary (Signed)
Postpartum Discharge Summary  Date of Service updated***     Patient Name: Angela [name] DOB: May 01, 1986 MRN: [id number]  Date of admission: 12/26/2021 Delivery date:12/26/2021  Delivering provider: Verita Schneiders A  Date of discharge: 12/26/2021  Admitting diagnosis: S/P repeat low transverse C-section [Z98.891] Intrauterine pregnancy: [redacted]w[redacted]d    Secondary diagnosis:  Principal Problem:   S/P repeat low transverse C-section Active Problems:   Sickle cell trait ([place]   Supervision of high risk pregnancy, antepartum   AMA (advanced maternal age) multigravida 35+   history of c/s x 3   Maternal morbid obesity, antepartum ([place]   Alpha thalassemia silent carrier   Gestational diabetes mellitus (GDM) in third trimester  Additional problems: Hemorrhoids    Discharge diagnosis: Term Pregnancy Delivered                                              Post partum procedures:{Postpartum procedures:23558} Augmentation: N/A Complications: None  Hospital course: Sceduled C/S   36y.o. yo [id number] admitted to the hospital 12/26/2021 for scheduled cesarean section with the following indication:Elective Repeat.Delivery details are as follows:  Membrane Rupture Time/Date:  ,   Delivery Method:C-Section, Low Transverse  Details of operation can be found in separate operative note.  Patient had an uncomplicated postpartum course.  She is ambulating, tolerating a regular diet, passing flatus, and urinating well. Patient is discharged home in stable condition on  12/26/21        Newborn Data: Birth date:12/26/2021  Birth time:6:21 AM  Gender:Female  Living status:  Apgars:8 ,9  Weight:     Magnesium Sulfate received: No BMZ received: No Rhophylac:N/A MMR:N/A T-DaP: declined Flu: No Transfusion:{Transfusion received:30440034}  Physical exam  Vitals:   12/26/21 0433 12/26/21 0434 12/26/21 0435  BP: 129/84 129/84   Pulse: 91 89   Resp: 18 19   Temp: 98.3 F (36.8 C) 98.3 F  (36.8 C)   TempSrc: Oral Oral   SpO2: 99%  99%  Weight: 108.5 kg    Height: 5' 3"  (1.6 m)     General: {Exam; general:21111117} Lochia: {Desc; appropriate/inappropriate:30686::"appropriate"} Uterine Fundus: {Desc; firm/soft:30687} Incision: {Exam; incision:21111123} DVT Evaluation: {Exam; dvKYH:0623762}abs: Lab Results  Component Value Date   WBC 8.4 12/26/2021   HGB 11.5 (L) 12/26/2021   HCT 35.2 (L) 12/26/2021   MCV 73.2 (L) 12/26/2021   PLT 285 12/26/2021      Latest Ref Rng & Units 12/05/2021   11:53 AM  CMP  Glucose 70 - 99 mg/dL 112   BUN 6 - 20 mg/dL 7   Creatinine 0.57 - 1.00 mg/dL 0.70   Sodium 134 - 144 mmol/L 141   Potassium 3.5 - 5.2 mmol/L 4.7   Chloride 96 - 106 mmol/L 104   CO2 20 - 29 mmol/L 19   Calcium 8.7 - 10.2 mg/dL 9.6   Total Protein 6.0 - 8.5 g/dL 6.3   Total Bilirubin 0.0 - 1.2 mg/dL 0.3   Alkaline Phos 44 - 121 IU/L 113   AST 0 - 40 IU/L 15   ALT 0 - 32 IU/L 15    Edinburgh Score:     No data to display           After visit meds:  Allergies as of 12/26/2021   No Known Allergies   Med Rec must be completed  prior to using this [facility]***        Discharge home in stable condition Infant Feeding: {Baby feeding:23562} Infant Disposition:{CHL IP OB HOME WITH UHKISN:01415} Discharge instruction: per After Visit Summary and Postpartum booklet. Activity: Advance as tolerated. Pelvic rest for 6 weeks.  Diet: {OB FRHZ:12508719} Future Appointments: Future Appointments  Date Time Provider [place]  12/26/2021 10:00 AM [facility] PAT 1 [facility] None   Follow up Visit:  Message sent 12/26/21 to [facility] by Shelda Pal, DO Please schedule this patient for a In person postpartum visit in 6 weeks with the following provider: Any provider. Additional Postpartum F/U:Incision check 1 week  High risk pregnancy complicated by: GDM Delivery mode:  C-Section, Low Transverse  Anticipated Birth Control:   Nexplanon   12/26/2021 Shelda Pal, DO

## 2021-12-26 NOTE — Anesthesia Preprocedure Evaluation (Addendum)
Anesthesia Evaluation  Patient identified by MRN, date of birth, ID band Patient awake    Reviewed: Allergy & Precautions, NPO status , Patient's Chart, lab work & pertinent test results  Airway Mallampati: II       Dental no notable dental hx.    Pulmonary neg pulmonary ROS,    Pulmonary exam normal        Cardiovascular hypertension, Normal cardiovascular exam     Neuro/Psych  Headaches, negative psych ROS   GI/Hepatic negative GI ROS, Neg liver ROS,   Endo/Other  diabetes, Gestational, Oral Hypoglycemic AgentsMorbid obesity  Renal/GU negative Renal ROS  negative genitourinary   Musculoskeletal negative musculoskeletal ROS (+)   Abdominal (+) + obese,   Peds  Hematology  (+) Blood dyscrasia, anemia ,   Anesthesia Other Findings   Reproductive/Obstetrics (+) Pregnancy                            Anesthesia Physical Anesthesia Plan  ASA: 3  Anesthesia Plan: Combined Spinal and Epidural   Post-op Pain Management: Dilaudid IV   Induction:   PONV Risk Score and Plan: 3 and Ondansetron, Scopolamine patch - Pre-op and Treatment may vary due to age or medical condition  Airway Management Planned: Natural Airway, Simple Face Mask and Nasal Cannula  Additional Equipment: None  Intra-op Plan:   Post-operative Plan:   Informed Consent: I have reviewed the patients History and Physical, chart, labs and discussed the procedure including the risks, benefits and alternatives for the proposed anesthesia with the patient or authorized representative who has indicated his/her understanding and acceptance.       Plan Discussed with: CRNA  Anesthesia Plan Comments: (2 PIV and blood available.)       Anesthesia Quick Evaluation

## 2021-12-26 NOTE — Transfer of Care (Signed)
Immediate Anesthesia Transfer of Care Note  Patient: Angela [name]  Procedure(s) Performed: CESAREAN SECTION  Patient Location: PACU  Anesthesia Type:Spinal  Level of Consciousness: awake  Airway & Oxygen Therapy: Patient Spontanous Breathing  Post-op Assessment: Report given to RN and Post -op Vital signs reviewed and stable  Post vital signs: Reviewed and stable  Last Vitals:  Vitals Value Taken Time  BP 105/49 12/26/21 0722  Temp    Pulse 88 12/26/21 0726  Resp 25 12/26/21 0726  SpO2 100 % 12/26/21 0726  Vitals shown include unvalidated device data.  Last Pain:  Vitals:   12/26/21 0434  TempSrc: Oral  PainSc:          Complications: No notable events documented.

## 2021-12-26 NOTE — Op Note (Signed)
[name] PROCEDURE DATE: 12/26/2021  PREOPERATIVE DIAGNOSES: Intrauterine pregnancy at [redacted]w[redacted]d weeks gestation; medication controlled gestational diabetes; morbid obesity;  previous cesarean section x 3; active labor  POSTOPERATIVE DIAGNOSES: The same  PROCEDURE: Repeat Low Transverse Cesarean Section  SURGEON:  Dr. Jaynie Collins  ASSISTANT:  Dr. Patrcia Dolly. An experienced assistant was required given the standard of surgical care given the complexity of the case.  This assistant was needed for exposure, dissection, suctioning, retraction, instrument exchange, assisting with delivery with administration of fundal pressure, and for overall help during the procedure.  ANESTHESIOLOGY TEAM: Anesthesiologist: Leilani Able, MD CRNA: Algis Greenhouse, CRNA; Sterling, Jannet Askew, CRNA  INDICATIONS: Angela [name] is a 36 y.o. [phone] at [redacted]w[redacted]d here for cesarean section secondary to the indications listed under preoperative diagnoses; please see preoperative note for further details.  The risks of surgery were discussed with the patient including but were not limited to: bleeding which may require transfusion or reoperation; infection which may require antibiotics; injury to bowel, bladder, ureters or other surrounding organs; injury to the fetus; need for additional procedures including hysterectomy in the event of a life-threatening hemorrhage; formation of adhesions; placental abnormalities wth subsequent pregnancies; incisional problems; thromboembolic phenomenon and other postoperative/anesthesia complications.  The patient concurred with the proposed plan, giving informed written consent for the procedure.    FINDINGS:  Viable female infant in cephalic presentation.  Apgars 8 and 9.  Loose nuchal cord x 1. Clear amniotic fluid.  Intact placenta, three vessel cord.  Bulging and thin lower uterine segment. Normal uterus, fallopian tubes and ovaries bilaterally. Minimal  intraperitoneal adhesive disease. Prevena placed at the end of the procedure.  ANESTHESIA: Spinal ESTIMATED BLOOD LOSS: 553 ml SPECIMENS: Placenta sent to L&D COMPLICATIONS: None immediate  PROCEDURE IN DETAIL:  The patient preoperatively received intravenous antibiotics and had sequential compression devices applied to her lower extremities.  She was then taken to the operating room where spinal anesthesia was administered and was found to be adequate. She was then placed in a dorsal supine position with a leftward tilt, and prepped and draped in a sterile manner.  A foley catheter was placed into her bladder and attached to constant gravity.  After an adequate timeout was performed, a Pfannenstiel skin incision was made with scalpel on her preexisting scar and carried through to the underlying layer of fascia.  The fascia was incised in the midline, and this incision was extended bilaterally using the Mayo scissors.  Kocher clamps were applied to the superior aspect of the fascial incision and the underlying rectus muscles were dissected off bluntly and sharply.  A similar process was carried out on the inferior aspect of the fascial incision. The rectus muscles were separated in the midline and the peritoneum was entered sharply. The Alexis self-retaining retractor was introduced into the abdominal cavity.  Attention was turned to the bulging lower uterine segment where a low transverse hysterotomy was made with a scalpel and extended bilaterally bluntly.  The infant was successfully delivered, the cord was clamped and cut after one minute, and the infant was handed over to the awaiting neonatology team. Uterine massage was then administered, and the placenta delivered intact with a three-vessel cord. The uterus was then cleared of clots and debris.  The hysterotomy was closed with 0 Vicryl in a running locked fashion, and an imbricating layer was also placed with 0 Vicryl.  The pelvis was cleared of all  clot and debris. Hemostasis was confirmed on all surfaces.  The retractor was removed.  The peritoneum was reapproximated with one 2-0 Vicryl interrupted stitch. The fascia was then closed using 0 PDS in a running fashion.  The subcutaneous layer was irrigated, reapproximated with 2-0 plain gut interrupted stitches, and the skin was closed with a 4-0 Vicryl subcuticular stitch.  After the skin was closed, a Prevena disposable negative pressure wound therapy device was placed over the incision.  The suction was activated at a pressure of -125 mmHg.  The adhesive was affixed well and there were no leaks noted. The patient tolerated the procedure well. Sponge, instrument and needle counts were correct x 3.  She was taken to the recovery room in stable condition.     Jaynie Collins, MD, FACOG Obstetrician & Gynecologist, [facility] for [employer], [facility]

## 2021-12-26 NOTE — Lactation Note (Signed)
This note was copied from a baby's chart. Lactation Consultation Note  Patient Name: Angela [name] Date: 12/26/2021 Reason for consult: Initial assessment;Early term 37-38.6wks;Maternal endocrine disorder Age:38 hours  LC in to visit with P5 birth parents of ET infant delivered by repeat C/S.  Birth parent is AMA and GDM on Metformin during 3rd trimester. 1st CBG at 30 mins pp, was 73.  Baby is currently sleepy and not latching to the breast after a few tries.   2nd CBG was 62.    Encouraged Mom to continue STS and to call for assistance with latching when baby starts showing interest and cueing.  Mom unable to express colostrum at present, reports + breast changes and Mom is experienced breastfeeding for 2+ years with her previous 4 babies.   Mom given a hand pump for have.  STORK pump process started.   Maternal Data Has patient been taught Hand Expression?: Yes Does the patient have breastfeeding experience prior to this delivery?: Yes How long did the patient breastfeed?: 2 yrs with previous 4 babies  Feeding Mother's Current Feeding Choice: Breast Milk and Formula  LATCH Score Latch: Too sleepy or reluctant, no latch achieved, no sucking elicited.  Audible Swallowing: None  Type of Nipple: Everted at rest and after stimulation (large nipple diameter)  Comfort (Breast/Nipple): Soft / non-tender  Hold (Positioning): Full assist, staff holds infant at breast  LATCH Score: 4   Lactation Tools Discussed/Used Tools: Pump;Flanges Breast pump type: Manual Reason for Pumping: Mom requested  Interventions Interventions: Breast feeding basics reviewed;Assisted with latch;Skin to skin;Breast massage;Hand express;Adjust position;Support pillows;Position options;Hand pump;LC Services brochure  Discharge Pump: Manual;Stork Pump (started process)  Consult Status Consult Status: Follow-up Date: 12/27/21 Follow-up type: In-patient    Judee Clara 12/26/2021,  10:34 AM

## 2021-12-26 NOTE — MAU Note (Signed)
External fetal and labor monitors off for transfer to OR via stretcher.
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
## 2021-12-26 NOTE — Anesthesia Procedure Notes (Signed)
Spinal  Patient location during procedure: OR Start time: 12/26/2021 5:40 AM End time: 12/26/2021 5:47 AM Staffing Performed: anesthesiologist  Anesthesiologist: Leilani Able, MD Performed by: Leilani Able, MD Authorized by: Leilani Able, MD   Preanesthetic Checklist Completed: patient identified, IV checked, site marked, risks and benefits discussed, surgical consent, monitors and equipment checked, pre-op evaluation and timeout performed Spinal Block Patient position: sitting Prep: DuraPrep Patient monitoring: cardiac monitor, continuous pulse ox, blood pressure and heart rate Approach: midline Location: L3-4 Injection technique: catheter Needle Needle type: Tuohy and Sprotte  Needle gauge: 24 G Needle length: 12.7 cm Needle insertion depth: 8 cm Catheter type: closed end flexible Catheter size: 19 g Catheter at skin depth: 13 cm Assessment Sensory level: T4 Events: CSF return

## 2021-12-26 NOTE — MAU Note (Signed)
.  Angela [name] is a 36 y.o. at [redacted]w[redacted]d here in MAU reporting: CTX since 1100 Sunday am, currently every 3-5 mins apart. Pt denies DFM, VB, LOF. GDM on Metformin, SVE 7/28 closed Previous C/S x3  Onset of complaint: 1100 8/6 Pain score: 10/10 Vitals:   12/26/21 0433  BP: 129/84  Pulse: 91  Resp: 18  Temp: 98.3 F (36.8 C)  SpO2: 99%     FHT:149 Lab orders placed from triage:  none

## 2021-12-27 ENCOUNTER — Encounter (HOSPITAL_COMMUNITY): Payer: Self-pay | Admitting: Family Medicine

## 2021-12-27 DIAGNOSIS — Z30430 Encounter for insertion of intrauterine contraceptive device: Secondary | ICD-10-CM

## 2021-12-27 HISTORY — PX: NEXPLANON TRAY: NUR84248

## 2021-12-27 LAB — CBC
HCT: 29 % — ABNORMAL LOW (ref 36.0–46.0)
Hemoglobin: 9.5 g/dL — ABNORMAL LOW (ref 12.0–15.0)
MCH: 23.6 pg — ABNORMAL LOW (ref 26.0–34.0)
MCHC: 32.8 g/dL (ref 30.0–36.0)
MCV: 72.1 fL — ABNORMAL LOW (ref 80.0–100.0)
Platelets: 231 10*3/uL (ref 150–400)
RBC: 4.02 MIL/uL (ref 3.87–5.11)
RDW: 18.8 % — ABNORMAL HIGH (ref 11.5–15.5)
WBC: 10.3 10*3/uL (ref 4.0–10.5)
nRBC: 2.1 % — ABNORMAL HIGH (ref 0.0–0.2)

## 2021-12-27 LAB — GLUCOSE, CAPILLARY: Glucose-Capillary: 96 mg/dL (ref 70–99)

## 2021-12-27 LAB — BIRTH TISSUE RECOVERY COLLECTION (PLACENTA DONATION)

## 2021-12-27 MED ORDER — LIDOCAINE HCL 1 % IJ SOLN
0.0000 mL | Freq: Once | INTRAMUSCULAR | Status: AC | PRN
  Administered 2021-12-27: 20 mL via INTRADERMAL
  Filled 2021-12-27: qty 20

## 2021-12-27 MED ORDER — ETONOGESTREL 68 MG ~~LOC~~ IMPL
68.0000 mg | DRUG_IMPLANT | Freq: Once | SUBCUTANEOUS | Status: AC
  Administered 2021-12-27: 68 mg via SUBCUTANEOUS
  Filled 2021-12-27: qty 1

## 2021-12-27 NOTE — Lactation Note (Signed)
This note was copied from a baby's chart. Lactation Consultation Note  Patient Name: Angela [name] [id number] Date: 12/27/2021 Reason for consult: Follow-up assessment;Early term 37-38.6wks;Maternal endocrine disorder Age:38 hours Breast feeding parent has been unable to latch baby. Baby hasn't fed since delivery per parents. Baby has no interest at all in BF or bottle feeding. Since baby wouldn't latch, LC attempted to give formula in bottle by pace feeding. Baby only suckled a few times. Clamped mouth tight against bottle nipple. Used purple nipple. Hand expression w/no colostrum noted. Noted edema to breast. Lt. Breast softer than Rt. Baby could probably latch to Lt. Breast but not to Rt. Breast d/t edema making nipple to large and hard for baby. LC set up DEBP. Mom shown how to use DEBP & how to disassemble, clean, & reassemble parts. Mom knows to pump q3h for 15-20 min. Mom encouraged to feed baby 8-12 times/24 hours and with feeding cues.   Reported to RN of jitteriness of baby. Parents states baby has been that jittery.  Maternal Data Has patient been taught Hand Expression?: Yes Does the patient have breastfeeding experience prior to this delivery?: Yes How long did the patient breastfeed?: 2 yrs each  Feeding Mother's Current Feeding Choice: Breast Milk and Formula Nipple Type: Nfant Slow Flow (purple)  LATCH Score Latch: Too sleepy or reluctant, no latch achieved, no sucking elicited.  Audible Swallowing: None  Type of Nipple: Everted at rest and after stimulation  Comfort (Breast/Nipple): Filling, red/small blisters or bruises, mild/mod discomfort (edema)  Hold (Positioning): Full assist, staff holds infant at breast  LATCH Score: 3   Lactation Tools Discussed/Used Tools: Pump;Flanges Flange Size: 30 Breast pump type: Double-Electric Breast Pump Pump Education: Setup, frequency, and cleaning;Milk Storage Reason for Pumping: breast stimulation Pumping frequency:  q3h  Interventions Interventions: Breast feeding basics reviewed;Assisted with latch;Skin to skin;Hand express;Reverse pressure;Breast compression;Adjust position;Support pillows;Position options;DEBP  Discharge    Consult Status Consult Status: Follow-up Date: 12/27/21 Follow-up type: In-patient    Anani Gu, Diamond Nickel 12/27/2021, 1:11 AM

## 2021-12-27 NOTE — Progress Notes (Cosign Needed)
POSTPARTUM PROGRESS NOTE  POD #1  Subjective:  Angela [name] is a 36 y.o. [id number] s/p rLTCS at [redacted]w[redacted]d.  She reports she doing well. No acute events overnight. She reports she is doing well. She denies any problems with ambulating, voiding or po intake. Denies nausea or vomiting. She has  passed flatus. Pain is well controlled.  Lochia is appropriate.  Objective: Blood pressure 106/76, pulse 67, temperature 98.4 F (36.9 C), temperature source Oral, resp. rate 18, height 5\' 3"  (1.6 m), weight 108.5 kg, last menstrual period 04/09/2021, SpO2 99 %, unknown if currently breastfeeding.  Physical Exam:  General: alert, cooperative and no distress Chest: no respiratory distress Heart:regular rate, distal pulses intact Abdomen: soft, nontender,  Uterine Fundus: firm, appropriately tender DVT Evaluation: No calf swelling or tenderness Extremities: no edema Skin: warm, dry; incision clean/dry/intact w/ prevena in place   Recent Labs    12/26/21 0503 12/27/21 0442  HGB 11.5* 9.5*  HCT 35.2* 29.0*    Assessment/Plan: Angela [name] is a 36 y.o. 20 s/p rLTSC at [redacted]w[redacted]d for hx prior CS .  POD#1 - Doing welll; pain well controlled. H/H appropriate  Routine postpartum care  OOB, ambulated  Lovenox for VTE prophylaxis Anemia: asymptomatic   Contraception: Nexplanon  Feeding: Breast  Dispo: Plan for discharge TOmorrow.   LOS: 1 day   [redacted]w[redacted]d, MD  12/27/2021, 6:15 AM

## 2021-12-27 NOTE — Lactation Note (Signed)
This note was copied from a baby's chart. Lactation Consultation Note  Patient Name: Angela [name] [id number] Date: 12/27/2021 Reason for consult: Follow-up assessment;Difficult latch;Early term 37-38.6wks;Maternal endocrine disorder Age:38 hours  LC in to visit with P5 birth parent of ET infant.  Baby is at a 3% weight loss and having good output.  Mom concerned that she "doesn't have any milk" and states that baby will latch, but doesn't suck.  DEBP was set up and Mom has pumped.  Encouraged frequent pumping to support milk supply.  Baby just was fed 20 ml formula and was sleeping with FOB.  Encouraged parent to call out for [facility] assistance at next feeding.    LC reviewed importance of washing disassembled pump parts after every pumping.  Mom states that she expressed "nothing", so LC removed flange and washed this for parent.  LC set up washing and drying station with 2 bins.  Plan recommended- 1- STS with baby as much as possible 2-Encouraged Mom to awaken baby if he is asleep after 3 hrs and try to latch and feed baby.   3- If baby unable to sustain a deep latch and feed consistently, baby should be supplemented with EBM+/formula per volume guidelines 4- Pump both breasts on initiation setting, adding hand expression  STORK pump received.  Lactation Tools Discussed/Used Tools: Pump;Flanges Flange Size: 30 Breast pump type: Double-Electric Breast Pump Pump Education: Setup, frequency, and cleaning;Milk Storage Pumping frequency: Encouraged Mom to pump after breastfeeding or attempts, or every 3 hrs. Pumped volume: 0 mL (drops)  Interventions Interventions: Breast feeding basics reviewed;Skin to skin;Breast massage;Hand express;DEBP;Pace feeding  Discharge Pump: Stork Pump (STORK pump received)  Consult Status Consult Status: Follow-up Date: 12/28/21 Follow-up type: In-patient    Judee Clara 12/27/2021, 10:33 AM

## 2021-12-27 NOTE — Procedures (Addendum)
GYNECOLOGY PROCEDURE NOTE  Angela [name] is a 36 y.o. [id number] here for Nexplanon insertion. No other gynecologic concerns.  Nexplanon Insertion Procedure Patient identified, informed consent performed, consent signed.   Patient does understand that irregular bleeding is a very common side effect of this medication. She was advised to have backup contraception for one week after placement. Appropriate time out taken.  Patient's left arm was prepped in the usual sterile fashion. The insertion area was measured and marked.  Patient was prepped with alcohol swab and then injected with 3 ml of 1% lidocaine.  The area was then prepped with betadine, Nexplanon removed from packaging,  device confirmed present within needle, then inserted full length of needle and withdrawn per handbook instructions. Nexplanon was able to palpated in the patient's arm; patient palpated the insert herself. There was minimal blood loss.  Patient insertion site covered with guaze and a pressure bandage to reduce any bruising.  The patient tolerated the procedure well and was given post procedure instructions.   Proctored by Pam Drown, DO Center for [facility], [facility]

## 2021-12-28 ENCOUNTER — Inpatient Hospital Stay (HOSPITAL_COMMUNITY): Admission: AD | Admit: 2021-12-28 | Payer: Medicaid Other | Source: Home / Self Care | Admitting: Family Medicine

## 2021-12-28 MED ORDER — FERROUS SULFATE 325 (65 FE) MG PO TABS: 325.0000 mg | ORAL_TABLET | ORAL | 0 refills | Status: AC

## 2021-12-28 MED ORDER — OXYCODONE-ACETAMINOPHEN 5-325 MG PO TABS: 1.0000 | ORAL_TABLET | ORAL | 0 refills | Status: AC | PRN

## 2021-12-28 MED ORDER — IBUPROFEN 600 MG PO TABS: 600.0000 mg | ORAL_TABLET | Freq: Four times a day (QID) | ORAL | 0 refills | Status: DC | PRN

## 2021-12-28 NOTE — Lactation Note (Signed)
This note was copied from a baby's chart. Lactation Consultation Note  Patient Name: Angela [name] YOVZC'H Date: 12/28/2021 Reason for consult: Follow-up assessment;Difficult latch;Early term 37-38.6wks;Infant weight loss (4 % weight loss. LC reviewed and updated the doc flow sheet with the baby's parents. LC offered to assist and mom receptive. Diffcult latch, that will take time with pumping.) Age:38 hours- Latch score 6  Baby unable to latch deeply due to areola edema.  LC recommended due to areola edema when mom goes home to have dad buy some natural coconut oil and to use a dab and use the reverse pressure exercise to elongate the nipple / areola complex  so the baby can latch over the nipple and onto the areola.  LC recommended - pumping with DEBP consistently for 15- 20 mins after attempt to latch and supplement the baby at least 30 ml , if still hungry increase as needed.  LC reviewed BF D/C teaching, offered to request LC O/P appt and mom receptive.  Mom aware baby needs to feed 8-12 times a day and in order to enhance milk coming in. Once the milk comes in and the baby is latching consistently decrease pumping.     Maternal Data    Feeding Mother's Current Feeding Choice: Breast Milk and Formula Nipple Type: Extra Slow Flow  LATCH Score Latch: Repeated attempts needed to sustain latch, nipple held in mouth throughout feeding, stimulation needed to elicit sucking reflex.  Audible Swallowing: None  Type of Nipple: Everted at rest and after stimulation (areola edema / and dry. baby unable to obtain the depth)  Comfort (Breast/Nipple): Soft / non-tender  Hold (Positioning): Assistance needed to correctly position infant at breast and maintain latch.  LATCH Score: 6   Lactation Tools Discussed/Used    Interventions Interventions: Breast feeding basics reviewed;Assisted with latch;Skin to skin;Hand express;Reverse pressure;Breast compression;Adjust position;Support  pillows;Position options;Education;LC Services brochure  Discharge Discharge Education: Engorgement and breast care;Warning signs for feeding baby;Outpatient recommendation;Outpatient Epic message sent;Other (comment) (mom aware she will receive a call from [facility]) Pump: Stork Pump  Consult Status Consult Status: Complete Date: 12/28/21    Kathrin Greathouse 12/28/2021, 11:26 AM

## 2021-12-28 NOTE — Discharge Instructions (Signed)
Keep the area clean and dry.  You can remove the big bandage in 24 hours, and the small steri-strip bandage in 3-5 days.  No sex until after your postpartum visit. You may have irregular vaginal bleeding for the first 6 months after the Nexplanon is placed, then the bleeding usually lightens and it is possible that you may not have any periods.  If you have any concerns, please give [place] a call.

## 2021-12-29 LAB — TYPE AND SCREEN
ABO/RH(D): A POS
Antibody Screen: NEGATIVE
Unit division: 0
Unit division: 0

## 2021-12-29 LAB — BPAM RBC
Blood Product Expiration Date: 202308282359
Blood Product Expiration Date: 202308282359
ISSUE DATE / TIME: 202308070239
ISSUE DATE / TIME: 202308070303
Unit Type and Rh: 6200
Unit Type and Rh: 6200

## 2022-01-03 ENCOUNTER — Ambulatory Visit (INDEPENDENT_AMBULATORY_CARE_PROVIDER_SITE_OTHER): Payer: Medicaid Other

## 2022-01-03 VITALS — BP 139/89 | HR 68 | Ht 63.0 in | Wt 221.0 lb

## 2022-01-03 DIAGNOSIS — Z5189 Encounter for other specified aftercare: Secondary | ICD-10-CM

## 2022-01-03 DIAGNOSIS — Z0130 Encounter for examination of blood pressure without abnormal findings: Secondary | ICD-10-CM

## 2022-01-03 MED ORDER — LISINOPRIL 5 MG PO TABS: 5.0000 mg | ORAL_TABLET | Freq: Every day | ORAL | 0 refills | Status: DC

## 2022-01-03 NOTE — Progress Notes (Addendum)
Subjective:     Karlina Suares is a 36 y.o. female who presents to the clinic 1 weeks status post  LTCS  for  Incision check . Eating a regular diet without difficulty. Bowel movements are normal. Pain is controlled without any medications.   Review of Systems Pertinent items are noted in HPI.    Objective:    BP 139/89 (BP Location: Left Arm, Cuff Size: Large)   Pulse 68   Ht 5\' 3"  (1.6 m)   Wt 221 lb (100.2 kg)   LMP 04/09/2021 (Exact Date)   Breastfeeding Yes   BMI 39.15 kg/m  General:  alert, cooperative, and no distress  Abdomen: soft, bowel sounds active, non-tender  Incision:   healing well, no drainage, no erythema, no hernia, no seroma, no swelling, no dehiscence, incision well approximated     Assessment:    Doing well postoperatively. BP is elevated, HA 3-8/10 x 2 days.   Plan:    1. Continue any current medications. 2. Wound care discussed. 3. Activity restrictions: none 4. Anticipated return to work: not applicable. 5. Per Dr. 04-20-1984 Follow up: 1 week for BP check,  Lisinopril 5 mg sent to Pharmacy.  6.  Keep PP appt.  Alysia Penna, RMA

## 2022-01-03 NOTE — Progress Notes (Signed)
Agree with nurses's documentation of this patient's clinic encounter.  Vietta Bonifield L, MD  

## 2022-01-10 ENCOUNTER — Ambulatory Visit (INDEPENDENT_AMBULATORY_CARE_PROVIDER_SITE_OTHER): Payer: Medicaid Other

## 2022-01-10 DIAGNOSIS — Z0130 Encounter for examination of blood pressure without abnormal findings: Secondary | ICD-10-CM

## 2022-01-10 MED ORDER — LISINOPRIL 10 MG PO TABS: 10.0000 mg | ORAL_TABLET | Freq: Every day | ORAL | 0 refills | Status: AC

## 2022-01-10 NOTE — Progress Notes (Signed)
Subjective:  Angela [name] is a 36 y.o. female here for BP check.   Hypertension ROS: taking medications as instructed, no medication side effects noted, patient does not perform home BP monitoring, no TIA's, no chest pain on exertion, noting some chest pains described as brief, no dyspnea on exertion, and no swelling of ankles. Patient reports intermittent RUQ pain. Has some swelling in her toes.    Objective:  LMP 04/09/2021 (Exact Date)   Appearance alert, well appearing, and in no distress. General exam BP noted to be well controlled today in office.    Assessment:   Blood Pressure borderline controlled.   Plan:  Lisinopril increased to 10 mg once daily. Patient to follow-up in 1 week for BP recheck. Patient instructed to check BP at home, and to report any unusual symptoms to [place] immediately or go to [facility]U.

## 2022-01-16 ENCOUNTER — Telehealth: Payer: Self-pay

## 2022-01-16 NOTE — Telephone Encounter (Signed)
Called pt after babyscripts alert of 121/92 with symptoms of nausea, abd pain, and headache. No answer, left vm, pt has appt in office tomorrow.

## 2022-01-17 ENCOUNTER — Ambulatory Visit (INDEPENDENT_AMBULATORY_CARE_PROVIDER_SITE_OTHER): Payer: Medicaid Other

## 2022-01-17 DIAGNOSIS — Z0130 Encounter for examination of blood pressure without abnormal findings: Secondary | ICD-10-CM

## 2022-01-17 NOTE — Progress Notes (Addendum)
Subjective:  Angela [name] is a 37 y.o. female here for BP check.   Hypertension ROS: taking medications as instructed, no medication side effects noted, no TIA's, no dyspnea on exertion, and no swelling of ankles.    Objective:  BP 114/82   Pulse (!) 108   Ht 5\' 3"  (1.6 m)   Wt 204 lb (92.5 kg)   LMP 04/09/2021 (Exact Date)   Breastfeeding Yes   BMI 36.14 kg/m   Appearance alert, well appearing, and in no distress. General exam BP noted to be well controlled today in office.    Assessment:   Blood Pressure well controlled. Headache 7/10 x 1 week whenever she does not wear her contact or eye glasses.  Plan:  Per Dr. 04/11/2021; Current treatment plan is effective, no change in therapy. Take Tylenol for headaches, go to [facility]U if tightness in chest persist.

## 2022-02-16 ENCOUNTER — Ambulatory Visit (INDEPENDENT_AMBULATORY_CARE_PROVIDER_SITE_OTHER): Payer: Medicaid Other | Admitting: Obstetrics and Gynecology

## 2022-02-16 ENCOUNTER — Ambulatory Visit: Payer: Medicaid Other | Admitting: Obstetrics and Gynecology

## 2022-02-16 DIAGNOSIS — O24439 Gestational diabetes mellitus in the puerperium, unspecified control: Secondary | ICD-10-CM | POA: Diagnosis not present

## 2022-02-16 NOTE — Progress Notes (Signed)
[facility] Part[facility] Visit Note  Angela [name] is a 36 y.o. [id number] female who presents for a postpartum visit. She is 6 weeks postpartum following a repeat cesarean section.  I have fully reviewed the prenatal and intrapartum course. The delivery was at 38.6 gestational weeks.  Anesthesia: spinal. Postpartum course has been uncomplicated, patient no longer with FOB. Baby is doing well. Baby is feeding by both breast and bottle - Similac Advance. Bleeding thin lochia. Bowel function is normal. Bladder function is normal. Patient is not sexually active. Contraception method is Nexplanon.  Postpartum depression screening: negative.   The pregnancy intention screening data noted above was reviewed. Potential methods of contraception were discussed. The patient elected to proceed with No data recorded.   Edinburgh Postnatal Depression Scale - 02/16/22 1142       Edinburgh Postnatal Depression Scale:  In the Past 7 Days   I have been able to laugh and see the funny side of things. 1    I have looked forward with enjoyment to things. 1    I have blamed myself unnecessarily when things went wrong. 0    I have been anxious or worried for no good reason. 2    I have felt scared or panicky for no good reason. 0    Things have been getting on top of me. 2    I have been so unhappy that I have had difficulty sleeping. 0    I have felt sad or miserable. 2    I have been so unhappy that I have been crying. 0    The thought of harming myself has occurred to me. 0    Edinburgh Postnatal Depression Scale Total 8             Health Maintenance Due  Topic Date Due   COVID-19 Vaccine (1) Never done   TETANUS/TDAP  Never done   INFLUENZA VACCINE  Never done    The following portions of the patient's history were reviewed and updated as appropriate: allergies, current medications, past family history, past medical history, past social history, past surgical history, and problem list.  Review of  Systems Pertinent items are noted in HPI.  Objective:  BP 122/87   Pulse 77   Wt 195 lb (88.5 kg)   LMP 04/09/2021 (Exact Date)   Breastfeeding Yes   BMI 34.54 kg/m    General:  alert, cooperative, and no distress   Breasts:  not indicated  Lungs: clear to auscultation bilaterally  Heart:  regular rate and rhythm  Abdomen: soft, non-tender; bowel sounds normal; no masses,  no organomegaly   Wound well approximated incision  GU exam:  not indicated       Assessment:    Encounter for postpartum  normal postpartum exam.   Plan:   Essential components of care per ACOG recommendations:  1.  Mood and well being: Patient with negative depression screening today. Reviewed local resources for support.  - Patient tobacco use? No.   - hx of drug use? Yes. Discussed support systems and outpatient/inpatient treatment options.  Pt uses sporadic THC  2. Infant care and feeding:  -Patient currently breastmilk feeding? Yes. Reviewed importance of draining breast regularly to support lactation.  -Social determinants of health (SDOH) reviewed in EPIC. The following needs were identified: increased risk for depression 2/2 breakup with spouse, pt declined behavorial health intervention  3. Sexuality, contraception and birth spacing - Patient does not want a pregnancy in the next  year.  Desired family size is 5 children.  - Reviewed reproductive life planning. Reviewed contraceptive methods based on pt preferences and effectiveness.  Patient desired Hormonal Implant today.   - Discussed birth spacing of 18 months  4. Sleep and fatigue -Encouraged family/partner/community support of 4 hrs of uninterrupted sleep to help with mood and fatigue  5. Physical Recovery  - Discussed patients delivery and complications. She describes her procedure as good. - Patient had a C-section repeat; no problems after deliver. Patient had no laceration. Perineal healing reviewed. Patient expressed  understanding - Patient has urinary incontinence? No. - Patient is safe to resume physical and sexual activity  6.  Health Maintenance - HM due items addressed Yes - Last pap smear  Diagnosis  Date Value Ref Range Status  07/12/2021   Final   - Negative for intraepithelial lesion or malignancy (NILM)   Pap smear not done at today's visit.  -Breast Cancer screening indicated? No.   7. Chronic Disease/Pregnancy Condition follow up: Gestational Diabetes Pt seen just before noon Will need to schedule postpartum 2 hour GTT - PCP follow up  Warden Fillers, MD [facility], [facility]

## 2022-02-20 ENCOUNTER — Other Ambulatory Visit: Payer: Medicaid Other

## 2022-02-23 ENCOUNTER — Other Ambulatory Visit: Payer: Medicaid Other

## 2022-02-23 DIAGNOSIS — O24419 Gestational diabetes mellitus in pregnancy, unspecified control: Secondary | ICD-10-CM

## 2022-02-24 LAB — GLUCOSE TOLERANCE, 2 HOURS
Glucose, 2 hour: 133 mg/dL (ref 70–139)
Glucose, GTT - Fasting: 108 mg/dL — ABNORMAL HIGH (ref 70–99)

## 2022-02-28 ENCOUNTER — Telehealth: Payer: Self-pay | Admitting: Emergency Medicine

## 2022-02-28 NOTE — Telephone Encounter (Signed)
Attempted TC to discuss results. LVM

## 2022-03-01 ENCOUNTER — Encounter: Payer: Self-pay | Admitting: Emergency Medicine

## 2022-03-01 ENCOUNTER — Telehealth: Payer: Self-pay | Admitting: Emergency Medicine

## 2022-03-01 NOTE — Telephone Encounter (Signed)
Return call patient to discuss results.  PCP list given via Mychart.

## 2022-03-09 ENCOUNTER — Ambulatory Visit (HOSPITAL_COMMUNITY)
Admission: EM | Admit: 2022-03-09 | Discharge: 2022-03-09 | Disposition: A | Discharge status: Home / Self Care | Payer: Medicaid Other | Attending: Internal Medicine | Admitting: Internal Medicine

## 2022-03-09 ENCOUNTER — Encounter (HOSPITAL_COMMUNITY): Payer: Self-pay

## 2022-03-09 ENCOUNTER — Telehealth (HOSPITAL_COMMUNITY): Payer: Self-pay

## 2022-03-09 DIAGNOSIS — L03213 Periorbital cellulitis: Secondary | ICD-10-CM

## 2022-03-09 MED ORDER — IBUPROFEN 600 MG PO TABS: 600.0000 mg | ORAL_TABLET | Freq: Four times a day (QID) | ORAL | 0 refills | Status: AC | PRN

## 2022-03-09 MED ORDER — IBUPROFEN 800 MG PO TABS
ORAL_TABLET | ORAL | Status: AC
  Filled 2022-03-09: qty 1

## 2022-03-09 MED ORDER — AMOXICILLIN-POT CLAVULANATE 875-125 MG PO TABS: 1.0000 | ORAL_TABLET | Freq: Two times a day (BID) | ORAL | 0 refills | Status: DC

## 2022-03-09 MED ORDER — IBUPROFEN 800 MG PO TABS
800.0000 mg | ORAL_TABLET | Freq: Once | ORAL | Status: AC
  Administered 2022-03-09: 800 mg via ORAL

## 2022-03-09 MED ORDER — AMOXICILLIN-POT CLAVULANATE 875-125 MG PO TABS: 1.0000 | ORAL_TABLET | Freq: Two times a day (BID) | ORAL | 0 refills | Status: AC

## 2022-03-09 NOTE — ED Triage Notes (Signed)
C/O left eye pain and swelling x 2-3 days. Pt reports left ear pain. She is not using anything to help with pain.
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
## 2022-03-09 NOTE — Discharge Instructions (Addendum)
Your left eye appears very infected.  Take Augmentin antibiotic 2 times daily (with breakfast and with dinner) for the next 7 days.   You may take 600 mg ibuprofen every 6 hours as needed for pain.  Place warm compresses to your left eye over the next several days to help reduce infection.  If you develop any new or worsening symptoms or do not improve in the next 2 to 3 days, please return.  If your symptoms are severe, please go to the emergency room.  Follow-up with your primary care provider for further evaluation and management of your symptoms as well as ongoing wellness visits.  I hope you feel better!

## 2022-03-28 NOTE — Telephone Encounter (Signed)
CALLED TO GET PT [place]

## 2023-06-13 ENCOUNTER — Ambulatory Visit (HOSPITAL_COMMUNITY)
Admission: EM | Admit: 2023-06-13 | Discharge: 2023-06-13 | Disposition: A | Discharge status: Home / Self Care | Payer: Medicaid Other | Attending: Emergency Medicine | Admitting: Emergency Medicine

## 2023-06-13 ENCOUNTER — Encounter (HOSPITAL_COMMUNITY): Payer: Self-pay

## 2023-06-13 DIAGNOSIS — M545 Low back pain, unspecified: Secondary | ICD-10-CM

## 2023-06-13 DIAGNOSIS — Z3202 Encounter for pregnancy test, result negative: Secondary | ICD-10-CM

## 2023-06-13 DIAGNOSIS — B349 Viral infection, unspecified: Secondary | ICD-10-CM

## 2023-06-13 LAB — POCT URINALYSIS DIP (MANUAL ENTRY)
Blood, UA: NEGATIVE
Glucose, UA: NEGATIVE mg/dL
Leukocytes, UA: NEGATIVE
Nitrite, UA: NEGATIVE
Protein Ur, POC: NEGATIVE mg/dL
Spec Grav, UA: 1.025
Urobilinogen, UA: 1 U/dL
pH, UA: 5.5

## 2023-06-13 LAB — POCT URINE PREGNANCY: Preg Test, Ur: NEGATIVE

## 2023-06-13 NOTE — Discharge Instructions (Signed)
Your pregnancy test was negative.  You not have a urinary tract infection.  Overall your physical exam was reassuring and you may have a viral illness.  Alternate between 800 mg of ibuprofen and 500 mg of Tylenol every 4-6 hours for any aches or pains.  Heat and gentle stretching can help with your back pain.  If your symptoms persist beyond the next 5 to 7 days please return to clinic or follow-up with your primary care provider for re-evaluation.

## 2023-06-13 NOTE — ED Triage Notes (Signed)
Patient reports lower back pain and chest pain x3 days. Patient reports no trauma or injuries.
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
## 2023-08-09 IMAGING — US US MFM OB DETAIL+14 WK
1 series · 13 of 28 positions shown · non-contrast
Comparison: none

[Series 1: us mfm ob detail+14 wk · 13 of 84 slices shown]
[im 4/84]
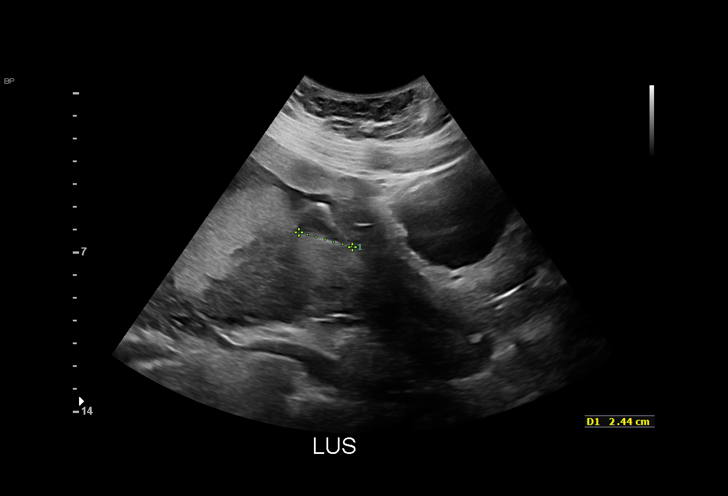
[im 10/84]
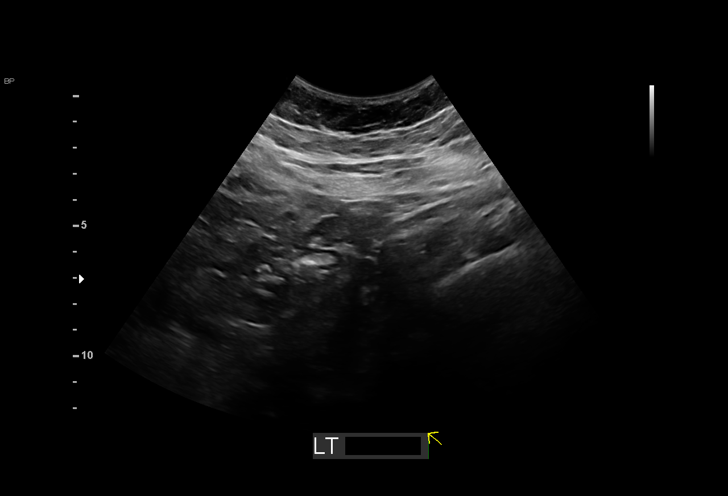
[im 16/84]
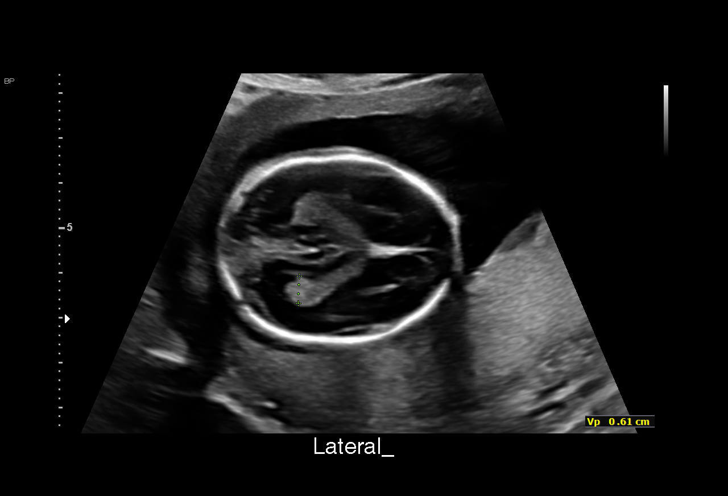
[im 22/84]
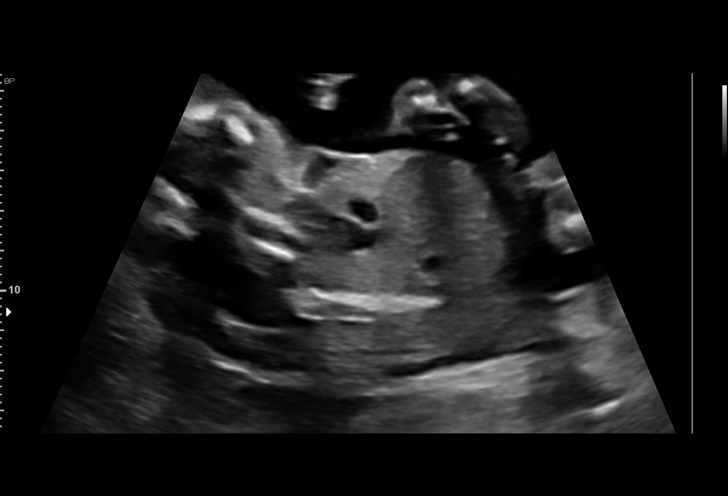
[im 28/84]
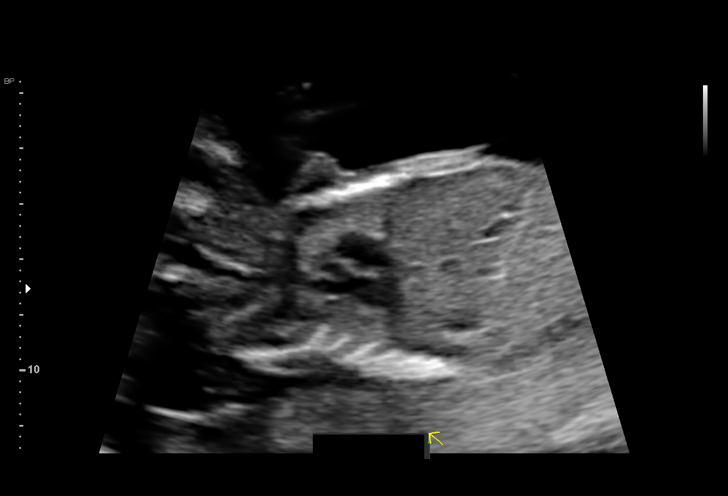
[im 34/84]
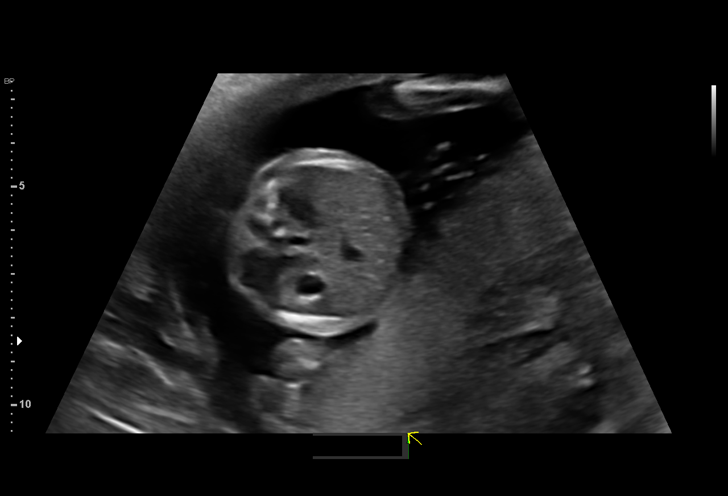
[im 44/84]
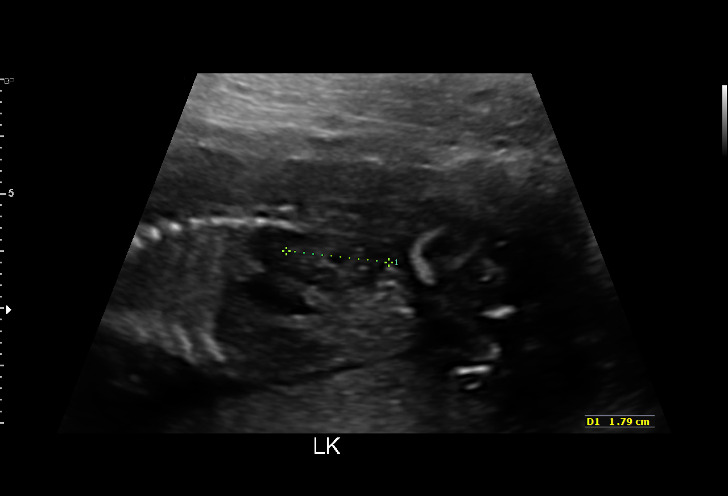
[im 50/84]
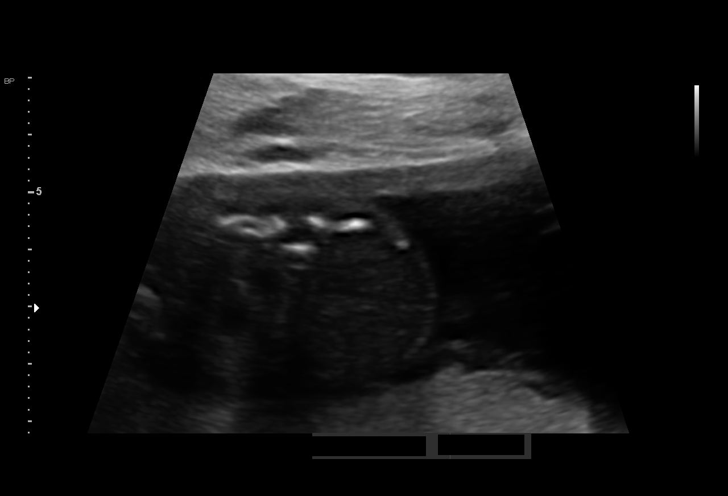
[im 56/84]
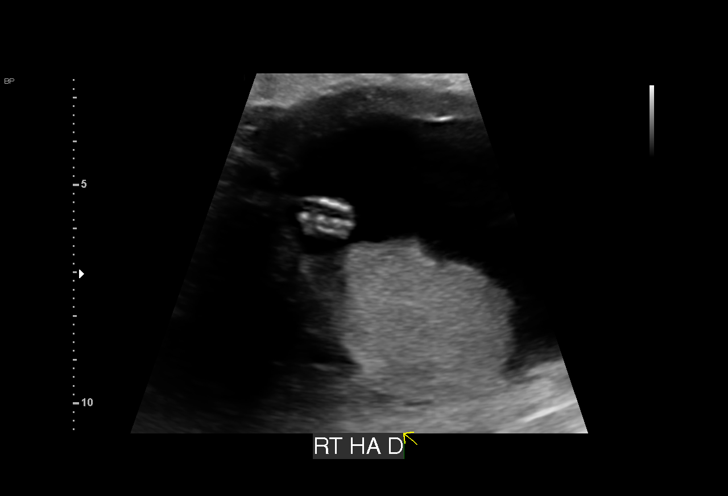
[im 62/84]
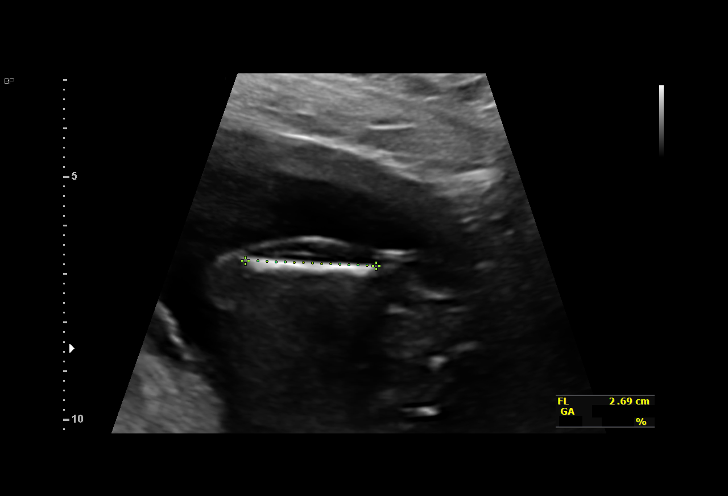
[im 68/84]
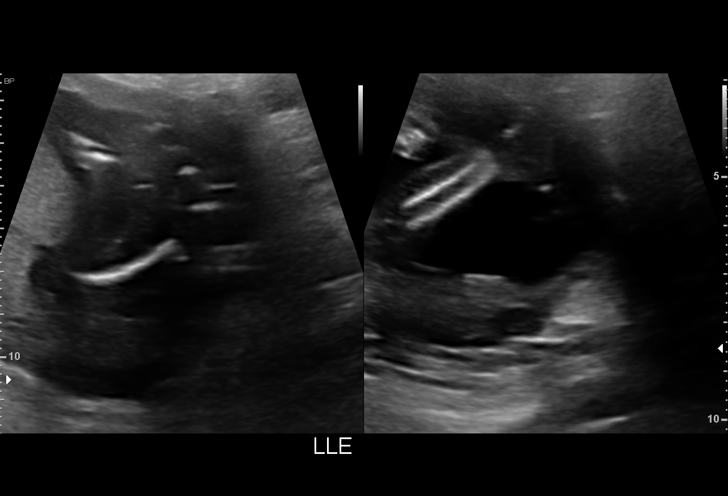
[im 74/84]
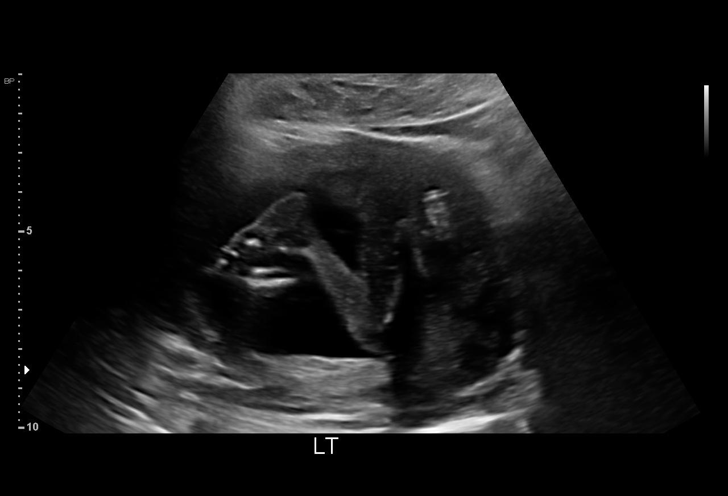
[im 80/84]
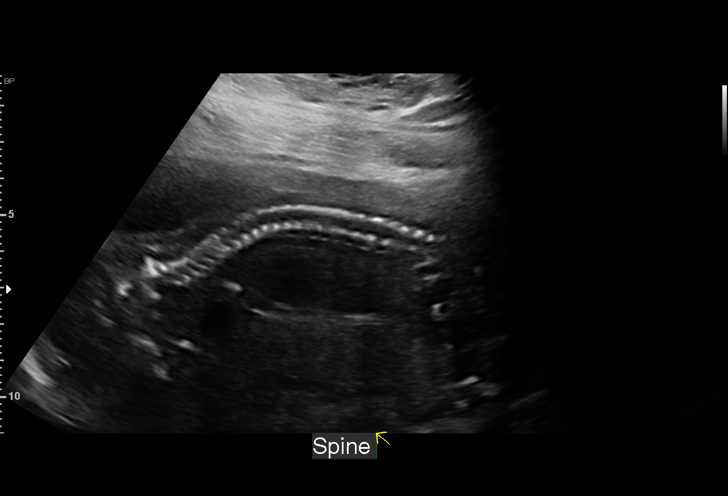

[13 of 28 positions shown; findings below may reference images not displayed]

Indications

 Advanced maternal age multigravida 35+,
 second trimester (35 y.o)
 Obesity complicating pregnancy, second
 trimester (pre-G BMI 38.09)
 Genetic carrier (carrier for sickle cell
 disease, silent carrier for alpha thalassemia)
 History of cesarean deliveries, currently
 pregnant (x3)
 LR NIPS
 Encounter for antenatal screening for
 malformations
 18 weeks gestation of pregnancy
Fetal Evaluation

 Num Of Fetuses:         1
 Fetal Heart Rate(bpm):  148
 Cardiac Activity:       Observed
 Presentation:           Breech
 Placenta:               Posterior
 P. Cord Insertion:      Visualized, central

 Amniotic Fluid
 AFI FV:      Within normal limits

                             Largest Pocket(cm)

Biometry

 BPD:      42.1  mm     G. Age:  18w 5d         46  %    CI:        72.23   %    70 - 86
                                                         FL/HC:      17.1   %    16.1 -
 HC:      157.6  mm     G. Age:  18w 5d         32  %    HC/AC:      1.21        1.09 -
 AC:      130.2  mm     G. Age:  18w 4d         36  %    FL/BPD:     63.9   %
 FL:       26.9  mm     G. Age:  18w 1d         21  %    FL/AC:      20.7   %    20 - 24
 HUM:      25.9  mm     G. Age:  18w 1d         31  %
 CER:      19.7  mm     G. Age:  19w 1d         50  %
 NFT:       3.4  mm
 LV:        6.1  mm
 CM:        4.3  mm

 Est. FW:     239  gm      0 lb 8 oz     22  %
OB History

 Blood Type:   A+
 Gravidity:    8         Term:   4         SAB:   3
 Living:       4
Gestational Age

 U/S Today:     18w 4d                                        EDD:   01/05/22
 Best:          18w 6d     Det. By:  U/S C R L  (06/17/21)    EDD:   01/03/22
Anatomy

 Cranium:               Appears normal         LVOT:                   Not well visualized
 Cavum:                 Appears normal         Aortic Arch:            Not well visualized
 Ventricles:            Appears normal         Ductal Arch:            Not well visualized
 Choroid Plexus:        Appears normal         Diaphragm:              Appears normal
 Cerebellum:            Appears normal         Stomach:                Appears normal, left
                                                                       sided
 Posterior Fossa:       Appears normal         Abdomen:                Appears normal
 Nuchal Fold:           Appears normal         Abdominal Wall:         Not well visualized
 Face:                  Not well visualized    Cord Vessels:           Appears normal (3
                                                                       vessel cord)
 Lips:                  Not well visualized    Kidneys:                Appear normal
 Palate:                Not well visualized    Bladder:                Appears normal
 Thoracic:              Appears normal         Spine:                  Limited views
                                                                       appear normal
 Heart:                 Not well visualized    Upper Extremities:      Appears normal
 RVOT:                  Not well visualized    Lower Extremities:      Appears normal

 Other:  Fetus appears to be a male. Heels visualized. Technically difficult due
         to maternal habitus and fetal position.
Cervix Uterus Adnexa

 Cervix
 Length:           3.21  cm.
 Normal appearance by transabdominal scan.

 Uterus
 Normal shape and size.
 Right Ovary
 Within normal limits.

 Left Ovary
 Not visualized.

 Cul De Sac
 No free fluid seen.

 Adnexa
 No adnexal mass visualized.
Impression

 G8 P4.  Patient is here for fetal anatomy scan
 On cell-free fetal DNA screening, the risks of fetal
 aneuploidies are not increased .
 Obstetric history is significant for a term vaginal delivery
 followed by 3 cesarean deliveries.  Her most recent cesarean
 delivery was in 2416.

 We performed fetal anatomy scan. No makers of
 aneuploidies or fetal structural defects are seen. Fetal
 biometry is consistent with her previously-established dates.
 Amniotic fluid is normal and good fetal activity is seen.
 Patient understands the limitations of ultrasound in detecting
 fetal anomalies.
 As maternal obesity limits resolution of images, failure to
 detect anomalies are more common .

 Patient has sickle cell trait and is a silent carrier for alpha
 thalassemia.  The couple had consultation with our genetic
 counselor and you will be receiving a separate letter.
Recommendations

 -An appointment was made for her to return in 4 weeks for
 completion of fetal anatomy.
                 Beasley, Brigido
